# Patient Record
Sex: Male | Born: 1950
Health system: Southern US, Community
[De-identification: ages and names within clinical notes are randomized; demographics above are authoritative.]

## PROBLEM LIST (undated history)

## (undated) DIAGNOSIS — I6529 Occlusion and stenosis of unspecified carotid artery: Secondary | ICD-10-CM

## (undated) DIAGNOSIS — M1990 Unspecified osteoarthritis, unspecified site: Secondary | ICD-10-CM

## (undated) DIAGNOSIS — E785 Hyperlipidemia, unspecified: Secondary | ICD-10-CM

## (undated) DIAGNOSIS — E7800 Pure hypercholesterolemia, unspecified: Secondary | ICD-10-CM

## (undated) DIAGNOSIS — K5090 Crohn's disease, unspecified, without complications: Secondary | ICD-10-CM

## (undated) DIAGNOSIS — I219 Acute myocardial infarction, unspecified: Secondary | ICD-10-CM

## (undated) DIAGNOSIS — C4490 Unspecified malignant neoplasm of skin, unspecified: Secondary | ICD-10-CM

## (undated) DIAGNOSIS — I2510 Atherosclerotic heart disease of native coronary artery without angina pectoris: Secondary | ICD-10-CM

## (undated) HISTORY — DX: Acute myocardial infarction, unspecified: I21.9

## (undated) HISTORY — PX: VASECTOMY: SHX75

## (undated) HISTORY — PX: HERNIA REPAIR: SHX51

## (undated) HISTORY — DX: Occlusion and stenosis of unspecified carotid artery: I65.29

## (undated) HISTORY — DX: Hyperlipidemia, unspecified: E78.5

## (undated) HISTORY — PX: APPENDECTOMY: SHX54

## (undated) HISTORY — PX: KNEE SURGERY: SHX244

## (undated) HISTORY — PX: ELBOW SURGERY: SHX618

## (undated) HISTORY — PX: TONSILLECTOMY: SUR1361

---

## 1998-06-18 ENCOUNTER — Inpatient Hospital Stay (HOSPITAL_COMMUNITY): Admission: EM | Admit: 1998-06-18 | Discharge: 1998-06-20 | Payer: Self-pay | Admitting: Emergency Medicine

## 2004-02-22 DIAGNOSIS — C4490 Unspecified malignant neoplasm of skin, unspecified: Secondary | ICD-10-CM

## 2004-02-22 HISTORY — DX: Unspecified malignant neoplasm of skin, unspecified: C44.90

## 2009-12-22 LAB — HM COLONOSCOPY

## 2010-08-05 ENCOUNTER — Other Ambulatory Visit: Payer: Self-pay | Admitting: Family Medicine

## 2010-08-11 ENCOUNTER — Ambulatory Visit
Admission: RE | Admit: 2010-08-11 | Discharge: 2010-08-11 | Disposition: A | Discharge status: Home / Self Care | Payer: BC Managed Care – PPO | Source: Ambulatory Visit | Attending: Family Medicine | Admitting: Family Medicine

## 2011-05-07 ENCOUNTER — Ambulatory Visit (INDEPENDENT_AMBULATORY_CARE_PROVIDER_SITE_OTHER): Payer: BC Managed Care – PPO | Admitting: Internal Medicine

## 2011-05-07 VITALS — BP 160/79 | HR 60 | Temp 98.0°F | Resp 16 | Ht 67.75 in | Wt 184.4 lb

## 2011-05-07 DIAGNOSIS — E785 Hyperlipidemia, unspecified: Secondary | ICD-10-CM

## 2011-05-07 DIAGNOSIS — J019 Acute sinusitis, unspecified: Secondary | ICD-10-CM

## 2011-05-07 DIAGNOSIS — Z789 Other specified health status: Secondary | ICD-10-CM

## 2011-05-07 DIAGNOSIS — J3089 Other allergic rhinitis: Secondary | ICD-10-CM

## 2011-05-07 DIAGNOSIS — J329 Chronic sinusitis, unspecified: Secondary | ICD-10-CM

## 2011-05-07 MED ORDER — AMOXICILLIN 500 MG PO CAPS: 1000.0000 mg | ORAL_CAPSULE | Freq: Two times a day (BID) | ORAL | Status: AC

## 2011-05-07 MED ORDER — METHYLPREDNISOLONE ACETATE 80 MG/ML IJ SUSP
80.0000 mg | Freq: Once | INTRAMUSCULAR | Status: AC
  Administered 2011-05-07: 80 mg via INTRAMUSCULAR

## 2011-05-07 NOTE — Progress Notes (Signed)
  Subjective:    Patient ID: Frederick Rush, male    DOB: 08/17/1950, 61 y.o.   MRN: [id number]  HPI Has allergys, now co st, sinus pain and yellow discharge. Mild cough,and chest congestion. Quit smoking, 10 yrs.   Review of Systems     Objective:   Physical Exam  Constitutional: He is oriented to person, place, and time. He appears well-developed.  HENT:  Right Ear: Tympanic membrane and ear canal normal.  Left Ear: Tympanic membrane and ear canal normal.  Nose: Mucosal edema, rhinorrhea and sinus tenderness present. Right sinus exhibits frontal sinus tenderness. Left sinus exhibits frontal sinus tenderness.  Mouth/Throat: Posterior oropharyngeal erythema present.  Eyes: EOM are normal.  Neck: Normal range of motion. Neck supple.  Cardiovascular: Normal rate and regular rhythm.   Pulmonary/Chest: Effort normal and breath sounds normal.  Neurological: He is alert and oriented to person, place, and time.          Assessment & Plan:  Sinusitis Allergy  Amoxil 1g BID Depomedrol 26m IM

## 2011-07-15 ENCOUNTER — Emergency Department (HOSPITAL_COMMUNITY): Payer: BC Managed Care – PPO

## 2011-07-15 ENCOUNTER — Ambulatory Visit (INDEPENDENT_AMBULATORY_CARE_PROVIDER_SITE_OTHER): Payer: BC Managed Care – PPO | Admitting: Family Medicine

## 2011-07-15 ENCOUNTER — Inpatient Hospital Stay (HOSPITAL_COMMUNITY)
Admission: EM | Admit: 2011-07-15 | Discharge: 2011-07-19 | DRG: 124 | Disposition: A | Discharge status: Home / Self Care | Payer: BC Managed Care – PPO | Attending: Cardiovascular Disease | Admitting: Cardiovascular Disease

## 2011-07-15 ENCOUNTER — Encounter (HOSPITAL_COMMUNITY): Payer: Self-pay

## 2011-07-15 VITALS — BP 138/94 | HR 57 | Temp 97.5°F | Resp 16 | Ht 67.5 in | Wt 180.0 lb

## 2011-07-15 DIAGNOSIS — I2510 Atherosclerotic heart disease of native coronary artery without angina pectoris: Principal | ICD-10-CM | POA: Diagnosis present

## 2011-07-15 DIAGNOSIS — R079 Chest pain, unspecified: Secondary | ICD-10-CM

## 2011-07-15 DIAGNOSIS — K5090 Crohn's disease, unspecified, without complications: Secondary | ICD-10-CM | POA: Diagnosis present

## 2011-07-15 DIAGNOSIS — Z87891 Personal history of nicotine dependence: Secondary | ICD-10-CM

## 2011-07-15 DIAGNOSIS — R42 Dizziness and giddiness: Secondary | ICD-10-CM

## 2011-07-15 DIAGNOSIS — K279 Peptic ulcer, site unspecified, unspecified as acute or chronic, without hemorrhage or perforation: Secondary | ICD-10-CM | POA: Diagnosis present

## 2011-07-15 DIAGNOSIS — I498 Other specified cardiac arrhythmias: Secondary | ICD-10-CM | POA: Diagnosis present

## 2011-07-15 DIAGNOSIS — I249 Acute ischemic heart disease, unspecified: Secondary | ICD-10-CM

## 2011-07-15 DIAGNOSIS — I200 Unstable angina: Secondary | ICD-10-CM | POA: Diagnosis present

## 2011-07-15 DIAGNOSIS — Z8249 Family history of ischemic heart disease and other diseases of the circulatory system: Secondary | ICD-10-CM

## 2011-07-15 DIAGNOSIS — R61 Generalized hyperhidrosis: Secondary | ICD-10-CM

## 2011-07-15 DIAGNOSIS — E785 Hyperlipidemia, unspecified: Secondary | ICD-10-CM

## 2011-07-15 HISTORY — PX: CARDIAC CATHETERIZATION: SHX172

## 2011-07-15 HISTORY — DX: Unspecified malignant neoplasm of skin, unspecified: C44.90

## 2011-07-15 HISTORY — DX: Crohn's disease, unspecified, without complications: K50.90

## 2011-07-15 HISTORY — DX: Pure hypercholesterolemia, unspecified: E78.00

## 2011-07-15 HISTORY — DX: Atherosclerotic heart disease of native coronary artery without angina pectoris: I25.10

## 2011-07-15 HISTORY — DX: Unspecified osteoarthritis, unspecified site: M19.90

## 2011-07-15 LAB — CBC
HCT: 36.7 % — ABNORMAL LOW (ref 39.0–52.0)
Hemoglobin: 12.8 g/dL — ABNORMAL LOW (ref 13.0–17.0)
MCH: 31 pg (ref 26.0–34.0)
MCHC: 34.9 g/dL (ref 30.0–36.0)
MCV: 88.9 fL (ref 78.0–100.0)
Platelets: 269 10*3/uL (ref 150–400)
RBC: 4.13 MIL/uL — ABNORMAL LOW (ref 4.22–5.81)
RDW: 12.9 % (ref 11.5–15.5)
WBC: 7 10*3/uL (ref 4.0–10.5)

## 2011-07-15 LAB — COMPREHENSIVE METABOLIC PANEL
ALT: 17 U/L (ref 0–53)
AST: 21 U/L (ref 0–37)
Albumin: 3.6 g/dL (ref 3.5–5.2)
Alkaline Phosphatase: 24 U/L — ABNORMAL LOW (ref 39–117)
BUN: 12 mg/dL (ref 6–23)
CO2: 26 mEq/L (ref 19–32)
Calcium: 9.2 mg/dL (ref 8.4–10.5)
Chloride: 100 mEq/L (ref 96–112)
Creatinine, Ser: 0.93 mg/dL (ref 0.50–1.35)
GFR calc Af Amer: >90 mL/min (ref >90)
GFR calc non Af Amer: 88 mL/min — ABNORMAL LOW (ref >90)
Glucose, Bld: 79 mg/dL (ref 70–99)
Potassium: 3.7 mEq/L (ref 3.5–5.1)
Sodium: 136 mEq/L (ref 135–145)
Total Bilirubin: 0.4 mg/dL (ref 0.3–1.2)
Total Protein: 6.1 g/dL (ref 6.0–8.3)

## 2011-07-15 LAB — PROTIME-INR
INR: 0.99 (ref 0.00–1.49)
Prothrombin Time: 13.3 seconds (ref 11.6–15.2)

## 2011-07-15 LAB — CK TOTAL AND CKMB (NOT AT ARMC)
CK, MB: 5.5 ng/mL — ABNORMAL HIGH (ref 0.3–4.0)
Relative Index: 2 (ref 0.0–2.5)
Total CK: 276 U/L — ABNORMAL HIGH (ref 7–232)

## 2011-07-15 LAB — TROPONIN I: Troponin I: <0.3 ng/mL (ref <0.30)

## 2011-07-15 MED ORDER — SODIUM CHLORIDE 0.9 % IV BOLUS (SEPSIS)
1000.0000 mL | Freq: Once | INTRAVENOUS | Status: AC
  Administered 2011-07-15: 1000 mL via INTRAVENOUS

## 2011-07-15 NOTE — ED Provider Notes (Addendum)
History     CSN: [id number]  Arrival date & time 07/15/11  20   First MD Initiated Contact with Patient 07/15/11 1705      Chief Complaint  Patient presents with  . Chest Pain     HPI The patient presents with new concerns of chest pain, diaphoresis.  He notes that over the past 3 days he has began to experience intermittent chest pain and diaphoresis.  Symptoms seem to occur after prolonged exertion, improved with rest.  There also seems to be some increased frequency with upright positioning, though this is inconsistent.  The patient denies any syncope, near syncope, vomiting.  He states that prior to the last few days he is typically unable to perform his activities of daily living, and his job function without significant fatigue, this has also become less possible over this timeframe. On arrival the patient has no complaints.  The patient has no history of cardiac disease, no history of hypertension or diabetes.  He also has no history of provocative cardiac testing.  Past Medical History  Diagnosis Date  . Hyperlipidemia   . Arthritis   . Hypercholesterolemia   . Crohn disease     Past Surgical History  Procedure Date  . Hernia repair   . Knee surgery     arthroscopy    No family history on file.  History  Substance Use Topics  . Smoking status: Former Research scientist (life sciences)  . Smokeless tobacco: Not on file  . Alcohol Use: Yes      Review of Systems  Constitutional:       Per HPI, otherwise negative  HENT:       Per HPI, otherwise negative  Eyes: Negative.   Respiratory:       Per HPI, otherwise negative  Cardiovascular:       Per HPI, otherwise negative  Gastrointestinal: Negative for vomiting.  Genitourinary: Negative.   Musculoskeletal:       Per HPI, otherwise negative  Skin: Negative.   Neurological: Negative for syncope.    Allergies  Review of patient's allergies indicates no known allergies.  Home Medications   Current Outpatient Rx  Name Route  Sig Dispense Refill  . MELOXICAM 15 MG PO TABS Oral Take 15 mg by mouth daily.    Marland Kitchen MESALAMINE ER 0.375 G PO CP24 Oral Take 1,500 mg by mouth every morning. 1530m=4 capsules    . ROSUVASTATIN CALCIUM 20 MG PO TABS Oral Take 20 mg by mouth every evening.       BP 146/82  Pulse 57  Temp(Src) 97.5 F (36.4 C) (Oral)  Resp 21  Ht 5' 8"  (1.727 m)  Wt 181 lb (82.101 kg)  BMI 27.52 kg/m2  SpO2 100%  Physical Exam  Nursing note and vitals reviewed. Constitutional: He is oriented to person, place, and time. He appears well-developed. No distress.  HENT:  Head: Normocephalic and atraumatic.  Eyes: Conjunctivae and EOM are normal.  Cardiovascular: Normal rate and regular rhythm.   Pulmonary/Chest: Effort normal. No stridor. No respiratory distress.  Abdominal: He exhibits no distension.  Musculoskeletal: He exhibits no edema.  Neurological: He is alert and oriented to person, place, and time.  Skin: Skin is warm and dry.  Psychiatric: He has a normal mood and affect.    ED Course  Procedures (including critical care time)  Labs Reviewed  COMPREHENSIVE METABOLIC PANEL - Abnormal; Notable for the following:    Alkaline Phosphatase 24 (*)    GFR calc non Af  Amer 88 (*)    All other components within normal limits  CBC - Abnormal; Notable for the following:    RBC 4.13 (*)    Hemoglobin 12.8 (*)    HCT 36.7 (*)    All other components within normal limits  CK TOTAL AND CKMB - Abnormal; Notable for the following:    Total CK 276 (*)    CK, MB 5.5 (*)    All other components within normal limits  TROPONIN I  PROTIME-INR   Dg Chest 2 View  07/15/2011  *RADIOLOGY REPORT*  Clinical Data: Left upper chest pain.  CHEST - 2 VIEW  Comparison: None.  Findings: Cardiac and mediastinal contours appear normal.  The lungs appear clear.  No pleural effusion is identified.  IMPRESSION:  No significant abnormality identified.  Original Report Authenticated By: Carron Curie, M.D.      No diagnosis found.   Cardiac 61 sinus rhythm normal Pulse oximetry 100% room air normal    Date: 07/15/2011  Rate: 60  Rhythm: normal sinus rhythm  QRS Axis: normal  Intervals: normal  ST/T Wave abnormalities: normal  Conduction Disutrbances: none  Narrative Interpretation: unremarkable     MDM  This generally well-appearing 61 year old male with no history of prior cardiac evaluation now presents with new diaphoresis, easy fatigability with exertion, chest pain.  On exam, and throughout the patient's ED evaluation he was asymptomatic.  The patient's labs are reassuring.  Although his CK-MB was mildly positive, the cardiac index was negative, which coupled with a negative troponin his reassuring for the absence of ongoing cardiac ischemia.  Given the patient's lack of prior imaging, he will be admitted to the CDU under the chest pain protocol for anticipated a.m. CT angiography     Carmin Muskrat, MD 07/15/11 1940  12:05 AM Patient remains asymptomatic in CDU.  His care was endorsed to Dr. Lita Mains.  Carmin Muskrat, MD 07/16/11 [phone]

## 2011-07-15 NOTE — ED Notes (Signed)
Pt stated that he was mowing the lawn on Wednesday and begin having left sided Chest tightness. He also became extremely weak. He stated that the pain went away in about 30 minutes- 1hour. He stated that he went to work the next day and begin having the exact same chest tightness with extreme sweating. Pain went away in about 30 minutes-1 hour.  He stated that today he begin have some more chest tightness and that radiated to his left arm. He went to the Urgent Care and they sent him to the ED. He stated that he has not had any SOB or n/v.  Currently, no CP or SOB. Will continue to monitor.

## 2011-07-15 NOTE — ED Notes (Signed)
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 Pt stated that Wednesday, after he mowed the grass, he was unusually tired. Pt was given 4 Baby ASA and 1 NTG SL.

## 2011-07-15 NOTE — ED Notes (Signed)
Seen and examined by Dr. Vanita Panda

## 2011-07-15 NOTE — Progress Notes (Signed)
Subjective: 61 year old man who mowed his lawn in the heat on Wednesday. He went indoors and developed a slight episode of severe diaphoresis. He has pain in his left upper chest and a little dizziness. He just sat there for a while. It finally subsided. 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 On his last he developed severe diaphoresis, dizziness, and some left chest and neck pain. He went inside a cooler and sat for about 20 minutes to wait until the sweating stopped. Today he had a similar episode. He drove himself here to the office. He was not any pain when he got here. He does now say that he has a little bit of pain in his left axillary wing.  He does have a history of hyperlipidemia. No other major heart disease.  Objective: Bilateral white white male acute distress, little bit anxious appearing. His throat clear. Neck supple. Chest clear to auscultation. Heart regular without murmurs. Soft without masses tenderness. EKG appears normal. Periodically he was noted rubbing his chest, and he did admit to the pain.  Patient was given 7.5 mg of aspirin and one sublingual much glycerin. IV was begun. He was given O2. EMS was called and he was transported to [facility].  Assessment: Acute coronary syndrome Dizziness Chest pain Diaphoresis  Plan: He was sent to the ER. He will need cardiac evaluation.

## 2011-07-15 NOTE — ED Notes (Signed)
Report given pt. Transferred to CDU, NAD noted

## 2011-07-15 NOTE — Patient Instructions (Signed)
To ER by EMS

## 2011-07-15 NOTE — ED Notes (Signed)
Report given to Wendy RN

## 2011-07-16 ENCOUNTER — Encounter (HOSPITAL_COMMUNITY): Payer: Self-pay | Admitting: Cardiology

## 2011-07-16 ENCOUNTER — Observation Stay (HOSPITAL_COMMUNITY): Payer: BC Managed Care – PPO

## 2011-07-16 DIAGNOSIS — Z8249 Family history of ischemic heart disease and other diseases of the circulatory system: Secondary | ICD-10-CM

## 2011-07-16 DIAGNOSIS — E785 Hyperlipidemia, unspecified: Secondary | ICD-10-CM | POA: Diagnosis present

## 2011-07-16 DIAGNOSIS — K5090 Crohn's disease, unspecified, without complications: Secondary | ICD-10-CM | POA: Diagnosis present

## 2011-07-16 DIAGNOSIS — I200 Unstable angina: Secondary | ICD-10-CM | POA: Diagnosis present

## 2011-07-16 DIAGNOSIS — K279 Peptic ulcer, site unspecified, unspecified as acute or chronic, without hemorrhage or perforation: Secondary | ICD-10-CM | POA: Diagnosis present

## 2011-07-16 DIAGNOSIS — Z87891 Personal history of nicotine dependence: Secondary | ICD-10-CM

## 2011-07-16 LAB — CARDIAC PANEL(CRET KIN+CKTOT+MB+TROPI)
CK, MB: 3.1 ng/mL (ref 0.3–4.0)
CK, MB: 3 ng/mL (ref 0.3–4.0)
Relative Index: 1.8 (ref 0.0–2.5)
Relative Index: 1.8 (ref 0.0–2.5)
Total CK: 171 U/L (ref 7–232)
Total CK: 169 U/L (ref 7–232)
Troponin I: <0.3 ng/mL (ref <0.30)
Troponin I: <0.3 ng/mL (ref <0.30)

## 2011-07-16 LAB — COMPREHENSIVE METABOLIC PANEL
ALT: 16 U/L (ref 0–53)
AST: 19 U/L (ref 0–37)
Albumin: 3.8 g/dL (ref 3.5–5.2)
Alkaline Phosphatase: 28 U/L — ABNORMAL LOW (ref 39–117)
BUN: 16 mg/dL (ref 6–23)
CO2: 24 mEq/L (ref 19–32)
Calcium: 9.5 mg/dL (ref 8.4–10.5)
Chloride: 102 mEq/L (ref 96–112)
Creatinine, Ser: 1.28 mg/dL (ref 0.50–1.35)
GFR calc Af Amer: 68 mL/min — ABNORMAL LOW (ref >90)
GFR calc non Af Amer: 58 mL/min — ABNORMAL LOW (ref >90)
Glucose, Bld: 96 mg/dL (ref 70–99)
Potassium: 4.1 mEq/L (ref 3.5–5.1)
Sodium: 136 mEq/L (ref 135–145)
Total Bilirubin: 0.6 mg/dL (ref 0.3–1.2)
Total Protein: 6.5 g/dL (ref 6.0–8.3)

## 2011-07-16 LAB — DIFFERENTIAL
Basophils Absolute: 0.1 10*3/uL (ref 0.0–0.1)
Basophils Relative: 1 % (ref 0–1)
Eosinophils Absolute: 0.2 10*3/uL (ref 0.0–0.7)
Eosinophils Relative: 2 % (ref 0–5)
Lymphocytes Relative: 18 % (ref 12–46)
Lymphs Abs: 1.8 10*3/uL (ref 0.7–4.0)
Monocytes Absolute: 1.1 10*3/uL — ABNORMAL HIGH (ref 0.1–1.0)
Monocytes Relative: 11 % (ref 3–12)
Neutro Abs: 6.8 10*3/uL (ref 1.7–7.7)
Neutrophils Relative %: 69 % (ref 43–77)

## 2011-07-16 LAB — CBC
HCT: 39.4 % (ref 39.0–52.0)
Hemoglobin: 13.6 g/dL (ref 13.0–17.0)
MCH: 31.1 pg (ref 26.0–34.0)
MCHC: 34.5 g/dL (ref 30.0–36.0)
MCV: 90 fL (ref 78.0–100.0)
Platelets: 261 10*3/uL (ref 150–400)
RBC: 4.38 MIL/uL (ref 4.22–5.81)
RDW: 12.9 % (ref 11.5–15.5)
WBC: 10 10*3/uL (ref 4.0–10.5)

## 2011-07-16 LAB — MAGNESIUM: Magnesium: 2.1 mg/dL (ref 1.5–2.5)

## 2011-07-16 LAB — POCT I-STAT TROPONIN I
Troponin i, poc: 0.01 ng/mL (ref 0.00–0.08)
Troponin i, poc: 0 ng/mL (ref 0.00–0.08)

## 2011-07-16 LAB — APTT: aPTT: 25 seconds (ref 24–37)

## 2011-07-16 LAB — TSH: TSH: 1.212 u[IU]/mL (ref 0.350–4.500)

## 2011-07-16 LAB — HEPARIN LEVEL (UNFRACTIONATED): Heparin Unfractionated: 0.46 IU/mL (ref 0.30–0.70)

## 2011-07-16 LAB — PROTIME-INR
INR: 0.96 (ref 0.00–1.49)
Prothrombin Time: 13 seconds (ref 11.6–15.2)

## 2011-07-16 MED ORDER — MESALAMINE ER 0.375 G PO CP24: 1.5000 g | ORAL_CAPSULE | Freq: Every morning | ORAL | Status: DC

## 2011-07-16 MED ORDER — ASPIRIN 81 MG PO CHEW
324.0000 mg | CHEWABLE_TABLET | ORAL | Status: AC
  Administered 2011-07-16: 324 mg via ORAL
  Filled 2011-07-16: qty 3

## 2011-07-16 MED ORDER — ACETAMINOPHEN 325 MG PO TABS
650.0000 mg | ORAL_TABLET | ORAL | Status: DC | PRN
  Administered 2011-07-17 – 2011-07-18 (×2): 650 mg via ORAL
  Filled 2011-07-16 (×2): qty 2

## 2011-07-16 MED ORDER — ALPRAZOLAM 0.25 MG PO TABS: 0.2500 mg | ORAL_TABLET | Freq: Three times a day (TID) | ORAL | Status: DC | PRN

## 2011-07-16 MED ORDER — METOPROLOL TARTRATE 25 MG PO TABS
ORAL_TABLET | ORAL | Status: AC
  Filled 2011-07-16: qty 2

## 2011-07-16 MED ORDER — METOPROLOL TARTRATE 25 MG PO TABS
50.0000 mg | ORAL_TABLET | Freq: Once | ORAL | Status: AC
  Administered 2011-07-16: 50 mg via ORAL

## 2011-07-16 MED ORDER — SODIUM CHLORIDE 0.9 % IV SOLN: 250.0000 mL | INTRAVENOUS | Status: DC | PRN

## 2011-07-16 MED ORDER — NITROGLYCERIN 0.4 MG SL SUBL
SUBLINGUAL_TABLET | SUBLINGUAL | Status: AC
  Filled 2011-07-16: qty 25

## 2011-07-16 MED ORDER — NITROGLYCERIN 0.4 MG SL SUBL: 0.4000 mg | SUBLINGUAL_TABLET | SUBLINGUAL | Status: DC | PRN

## 2011-07-16 MED ORDER — TRAMADOL HCL 50 MG PO TABS
50.0000 mg | ORAL_TABLET | Freq: Four times a day (QID) | ORAL | Status: DC | PRN
  Filled 2011-07-16: qty 1

## 2011-07-16 MED ORDER — ASPIRIN 300 MG RE SUPP
300.0000 mg | RECTAL | Status: AC
  Filled 2011-07-16: qty 1

## 2011-07-16 MED ORDER — METOPROLOL TARTRATE 1 MG/ML IV SOLN
INTRAVENOUS | Status: AC
  Filled 2011-07-16: qty 5

## 2011-07-16 MED ORDER — PANTOPRAZOLE SODIUM 40 MG PO TBEC
40.0000 mg | DELAYED_RELEASE_TABLET | Freq: Every day | ORAL | Status: DC
  Administered 2011-07-16 – 2011-07-19 (×4): 40 mg via ORAL
  Filled 2011-07-16 (×4): qty 1

## 2011-07-16 MED ORDER — HEPARIN BOLUS VIA INFUSION
4000.0000 [IU] | Freq: Once | INTRAVENOUS | Status: AC
  Administered 2011-07-16: 4000 [IU] via INTRAVENOUS
  Filled 2011-07-16: qty 4000

## 2011-07-16 MED ORDER — MELOXICAM 15 MG PO TABS
15.0000 mg | ORAL_TABLET | Freq: Every day | ORAL | Status: DC
  Administered 2011-07-16 – 2011-07-18 (×3): 15 mg via ORAL
  Filled 2011-07-16 (×4): qty 1

## 2011-07-16 MED ORDER — SODIUM CHLORIDE 0.9 % IJ SOLN: 3.0000 mL | INTRAMUSCULAR | Status: DC | PRN

## 2011-07-16 MED ORDER — HEPARIN (PORCINE) IN NACL 100-0.45 UNIT/ML-% IJ SOLN
1200.0000 [IU]/h | INTRAMUSCULAR | Status: DC
  Administered 2011-07-16 – 2011-07-18 (×3): 1200 [IU]/h via INTRAVENOUS
  Filled 2011-07-16 (×5): qty 250

## 2011-07-16 MED ORDER — ASPIRIN EC 81 MG PO TBEC
81.0000 mg | DELAYED_RELEASE_TABLET | Freq: Every day | ORAL | Status: DC
  Administered 2011-07-17 – 2011-07-18 (×2): 81 mg via ORAL
  Filled 2011-07-16 (×3): qty 1

## 2011-07-16 MED ORDER — ATORVASTATIN CALCIUM 10 MG PO TABS
10.0000 mg | ORAL_TABLET | Freq: Every day | ORAL | Status: DC
  Administered 2011-07-16 – 2011-07-18 (×3): 10 mg via ORAL
  Filled 2011-07-16 (×4): qty 1

## 2011-07-16 MED ORDER — ZOLPIDEM TARTRATE 5 MG PO TABS: 10.0000 mg | ORAL_TABLET | Freq: Every evening | ORAL | Status: DC | PRN

## 2011-07-16 MED ORDER — ONDANSETRON HCL 4 MG/2ML IJ SOLN: 4.0000 mg | Freq: Four times a day (QID) | INTRAMUSCULAR | Status: DC | PRN

## 2011-07-16 NOTE — Progress Notes (Signed)
ANTICOAGULATION CONSULT NOTE - Follow Up Consult  Pharmacy Consult for Heparin Indication: chest pain/ACS  No Known Allergies  Patient Measurements: Height: 5' 8"  (172.7 cm) Weight: 181 lb (82.101 kg) IBW/kg (Calculated) : 68.4  Heparin Dosing Weight: 82 kg  Vital Signs: Temp: 98.5 F (36.9 C) (05/25 2100) Temp src: Oral (05/25 2100) BP: 145/74 mmHg (05/25 2100) Pulse Rate: 52  (05/25 2100)  Labs:  Basename 07/16/11 2108 07/16/11 1905 07/16/11 1515 07/16/11 1307 07/15/11 1712 07/15/11 1710  HGB -- -- -- 13.6 -- 12.8*  HCT -- -- -- 39.4 -- 36.7*  PLT -- -- -- 261 -- 269  APTT -- -- -- 25 -- --  LABPROT -- -- -- 13.0 -- 13.3  INR -- -- -- 0.96 -- 0.99  HEPARINUNFRC 0.46 -- -- -- -- --  CREATININE -- -- -- 1.28 -- 0.93  CKTOTAL -- 169 171 -- 276* --  CKMB -- 3.0 3.1 -- 5.5* --  TROPONINI -- <0.30 <0.30 -- <0.30 --    Estimated Creatinine Clearance: 62.5 ml/min (by C-G formula based on Cr of 1.28).   Assessment: 61 y.o. M on heparin for ACS sx while awaiting cardiac cath on Tuesday, 5/28. Heparin level this evening is therapeutic (HL 0.46, goal of 0.3-0.7). No s/sx of bleeding noted per nurse report.   Goal of Therapy:  Heparin level 0.3-0.7 units/ml Monitor platelets by anticoagulation protocol: Yes   Plan:  1. Continue heparin at 1200 units/hr (12 ml/hr) 2. Will continue to monitor for any signs/symptoms of bleeding and will follow up with heparin level in the a.m to confirm therapeutic.   Alycia Rossetti, PharmD, BCPS Clinical Pharmacist Pager: [phone] 07/16/2011 10:13 PM

## 2011-07-16 NOTE — ED Notes (Signed)
Left AC NSL intact and site u

## 2011-07-16 NOTE — ED Notes (Signed)
Pt NPO and advised he cannot have anything else to eat or drink due to scheduled test for 07/16/11. Verbalized understanding.

## 2011-07-16 NOTE — Progress Notes (Signed)
ANTICOAGULATION CONSULT NOTE - Initial Consult  Pharmacy Consult for heparin Indication: chest pain/ACS  No Known Allergies  Patient Measurements: Height: 5' 8"  (172.7 cm) Weight: 181 lb (82.101 kg) IBW/kg (Calculated) : 68.4  Heparin Dosing Weight: 75 kg  Vital Signs: BP: 139/100 mmHg (05/25 1059) Pulse Rate: 61  (05/25 1059)  Labs:  Basename 07/15/11 1712 07/15/11 1710  HGB -- 12.8*  HCT -- 36.7*  PLT -- 269  APTT -- --  LABPROT -- 13.3  INR -- 0.99  HEPARINUNFRC -- --  CREATININE -- 0.93  CKTOTAL 276* --  CKMB 5.5* --  TROPONINI <0.30 --    Estimated Creatinine Clearance: 86.1 ml/min (by C-G formula based on Cr of 0.93).   Medical History: Past Medical History  Diagnosis Date  . Hyperlipidemia   . Arthritis   . Hypercholesterolemia   . Crohn disease     Medications:  Prescriptions prior to admission  Medication Sig Dispense Refill  . meloxicam (MOBIC) 15 MG tablet Take 15 mg by mouth daily.      . mesalamine (APRISO) 0.375 G 24 hr capsule Take 1,500 mg by mouth every morning. 1541m=4 capsules      . rosuvastatin (CRESTOR) 20 MG tablet Take 20 mg by mouth every evening.         Assessment: 61yo man to start heparin for CP. Goal of Therapy:  Heparin level 0.3-0.7 units/ml Monitor platelets by anticoagulation protocol: Yes   Plan:  Heparin 4000 unit bolus and drip at 1200 units/hr Check heparin level and CBC 8 hours after start and then daily while on heparin.  Muzamil Harker P[place]et 07/16/2011,1:20 PM

## 2011-07-16 NOTE — ED Provider Notes (Signed)
Medical screening examination/treatment/procedure(s) were conducted as a shared visit with non-physician practitioner(s) and myself.  I personally evaluated the patient during the encounter Pt is a 61 year old man with a 3 day history of exertional chest pain and sweating, who is currently asymptomatic.  He had cardiac CT with calcium score over 1000, indicating significant CAD.  10:58 AM  Case discussed with Kerin Ransom, P.A.-C. For [facility]. They will see pt.    Mylinda Latina III, MD 07/16/11 2035

## 2011-07-16 NOTE — H&P (Signed)
Patient ID: Frederick Rush MRN: [id number], DOB/AGE: [birth year]   Admit date: 07/15/2011   Primary Physician: William Hamburger, MD, MD Primary Cardiologist: Dr Gwenlyn Found (new)  HPI: 61 y/o male with no prior history of CAD, admitted yesterday to the ER from his primary care MD with chest pain and a history of diaphoresis. The pt says Thursday prior to admission he had an episode of spontaneous diaphoresis at work. He had no chest pain. Friday he had another episode of diaphoresis followed by some back pain that migrated to his Lt chest. He was sent to the ER for further evaluation. Coronary CT is abnormal and the patient is admitted now for for further evaluation and treatment.   Problem List: Past Medical History  Diagnosis Date  . Hyperlipidemia   . Arthritis   . Hypercholesterolemia   . Crohn disease     Past Surgical History  Procedure Date  . Hernia repair   . Knee surgery     arthroscopy     Allergies: No Known Allergies   Home Medications  (Not in a hospital admission)   Family History  Problem Relation Age of Onset  . Coronary artery disease Mother 86    CABG     History   Social History  . Marital Status: Married    Spouse Name: N/A    Number of Children: N/A  . Years of Education: N/A   Occupational History  . Not on file.   Social History Main Topics  . Smoking status: Former Smoker    Quit date: 07/15/2001  . Smokeless tobacco: Not on file  . Alcohol Use: Yes  . Drug Use: No  . Sexually Active: Not on file   Other Topics Concern  . Not on file   Social History Narrative   Drive a truck for [employer]     Review of Systems: General: negative for chills, fever, night sweats or weight changes.  Cardiovascular: negative for chest pain, dyspnea on exertion, edema, orthopnea, palpitations, paroxysmal nocturnal dyspnea or shortness of breath Dermatological: negative for rash Respiratory: negative for cough or wheezing Urologic: negative for  hematuria Abdominal: negative for nausea, vomiting, diarrhea, bright red blood per rectum, melena, or hematemesis Neurologic: negative for visual changes, syncope, or dizziness All other systems reviewed and are otherwise negative except as noted above.  Physical Exam: Blood pressure 139/100, pulse 61, temperature 97.5 F (36.4 C), temperature source Oral, resp. rate 20, height 5' 8"  (1.727 m), weight 82.101 kg (181 lb), SpO2 100.00%.  General appearance: alert, cooperative and no distress Neck: no carotid bruit, no JVD, supple, symmetrical, trachea midline and thyroid not enlarged, symmetric, no tenderness/mass/nodules Lungs: clear to auscultation bilaterally Heart: regular rate and rhythm, S1, S2 normal, no murmur, click, rub or gallop Abdomen: soft, non-tender; bowel sounds normal; no masses,  no organomegaly Extremities: extremities normal, atraumatic, no cyanosis or edema Pulses: 2+ and symmetric Skin: Skin color, texture, turgor normal. No rashes or lesions Neurologic: Grossly normal    Labs:   Results for orders placed during the hospital encounter of 07/15/11 (from the past 24 hour(s))  COMPREHENSIVE METABOLIC PANEL     Status: Abnormal   Collection Time   07/15/11  5:10 PM      Component Value Range   Sodium 136  135 - 145 (mEq/L)   Potassium 3.7  3.5 - 5.1 (mEq/L)   Chloride 100  96 - 112 (mEq/L)   CO2 26  19 - 32 (mEq/L)   Glucose, Bld  79  70 - 99 (mg/dL)   BUN 12  6 - 23 (mg/dL)   Creatinine, Ser 0.93  0.50 - 1.35 (mg/dL)   Calcium 9.2  8.4 - 10.5 (mg/dL)   Total Protein 6.1  6.0 - 8.3 (g/dL)   Albumin 3.6  3.5 - 5.2 (g/dL)   AST 21  0 - 37 (U/L)   ALT 17  0 - 53 (U/L)   Alkaline Phosphatase 24 (*) 39 - 117 (U/L)   Total Bilirubin 0.4  0.3 - 1.2 (mg/dL)   GFR calc non Af Amer 88 (*) >90 (mL/min)   GFR calc Af Amer >90  >90 (mL/min)  CBC     Status: Abnormal   Collection Time   07/15/11  5:10 PM      Component Value Range   WBC 7.0  4.0 - 10.5 (K/uL)   RBC  4.13 (*) 4.22 - 5.81 (MIL/uL)   Hemoglobin 12.8 (*) 13.0 - 17.0 (g/dL)   HCT 36.7 (*) 39.0 - 52.0 (%)   MCV 88.9  78.0 - 100.0 (fL)   MCH 31.0  26.0 - 34.0 (pg)   MCHC 34.9  30.0 - 36.0 (g/dL)   RDW 12.9  11.5 - 15.5 (%)   Platelets 269  150 - 400 (K/uL)  PROTIME-INR     Status: Normal   Collection Time   07/15/11  5:10 PM      Component Value Range   Prothrombin Time 13.3  11.6 - 15.2 (seconds)   INR 0.99  0.00 - 1.49   TROPONIN I     Status: Normal   Collection Time   07/15/11  5:12 PM      Component Value Range   Troponin I <0.30  <0.30 (ng/mL)  CK TOTAL AND CKMB     Status: Abnormal   Collection Time   07/15/11  5:12 PM      Component Value Range   Total CK 276 (*) 7 - 232 (U/L)   CK, MB 5.5 (*) 0.3 - 4.0 (ng/mL)   Relative Index 2.0  0.0 - 2.5   POCT I-STAT TROPONIN I     Status: Normal   Collection Time   07/16/11  3:30 AM      Component Value Range   Troponin i, poc 0.01  0.00 - 0.08 (ng/mL)   Comment 3           POCT I-STAT TROPONIN I     Status: Normal   Collection Time   07/16/11  9:33 AM      Component Value Range   Troponin i, poc 0.00  0.00 - 0.08 (ng/mL)   Comment 3              Radiology/Studies: Dg Chest 2 View  07/15/2011  *RADIOLOGY REPORT*  Clinical Data: Left upper chest pain.  CHEST - 2 VIEW  Comparison: None.  Findings: Cardiac and mediastinal contours appear normal.  The lungs appear clear.  No pleural effusion is identified.  IMPRESSION:  No significant abnormality identified.  Original Report Authenticated By: Carron Curie, M.D.    EKG:NSR without acute changes  ASSESSMENT AND PLAN:  Active Problems:  Unstable angina  Dyslipidemia  History of smoking  Family history of coronary artery bypass surgery  Crohn disease  Peptic ulcer disease, endo 2011  Plan-Admit cath Tuesday, add Heparin, nitrates, ASA, PPI. Hold off on beta blocker as he is a little bradycardic now.  Henri Medal, PA-C 07/16/2011, 11:23 AM  Agree  with note  written by Kerin Ransom PAC  + CRF, Sx C/W [place]. Exam benign. EKG w/o acute changes. ENZ neg. CT showed CCS of 1100 with calcium in LAD and RCA. Plan admit, ROMI. IV hep. Cath Tuesday.  Lorretta Harp 07/16/2011 3:48 PM

## 2011-07-16 NOTE — ED Notes (Signed)
Returned from ct scan, back on monitor, no distress noted

## 2011-07-16 NOTE — ED Notes (Signed)
Dr. Lita Mains updated re: one troponin ordered. Rec'd verbal order to order 2 more sets.

## 2011-07-16 NOTE — ED Notes (Signed)
Heart healthy diet tray ordered

## 2011-07-16 NOTE — ED Provider Notes (Signed)
Medical screening examination/treatment/procedure(s) were performed by non-physician practitioner and as supervising physician I was immediately available for consultation/collaboration.   Mylinda Latina III, MD 07/16/11 2033

## 2011-07-16 NOTE — Progress Notes (Signed)
10:58 AM Case discussed with Kerin Ransom, P.A.-C. For [facility] and V[facility].  They will see pt.

## 2011-07-16 NOTE — ED Notes (Signed)
Cardio at bedside to eval pt

## 2011-07-16 NOTE — ED Provider Notes (Signed)
Pt denies any CP, SOB, or palpitations.  Cardiac exam: RRR, no murmurs.  Lungs: CTA-B.  Pt is currently comfortable w/ no complaints.  Awaiting the CT cardiac angio.  [place], [place] 07/16/11 [phone]

## 2011-07-16 NOTE — ED Notes (Addendum)
Taken to CT scan on monitor with RN present

## 2011-07-16 NOTE — ED Provider Notes (Signed)
CT angio was held due to elevated calcium levels.  I've discussed the case with radiologist, Dr. Markus Daft.  Elevated calcium levels indicate higher risk of cardiac event.  Discussed pt's case with Dr. Monia Pouch.  He plans to consult cardiology for further evaluation.  I've notified pt and he is in understanding.  [place], [place] 07/16/11 1031

## 2011-07-17 LAB — CBC
HCT: 36.8 % — ABNORMAL LOW (ref 39.0–52.0)
Hemoglobin: 12.6 g/dL — ABNORMAL LOW (ref 13.0–17.0)
MCH: 30.7 pg (ref 26.0–34.0)
MCHC: 34.2 g/dL (ref 30.0–36.0)
MCV: 89.5 fL (ref 78.0–100.0)
Platelets: 222 10*3/uL (ref 150–400)
RBC: 4.11 MIL/uL — ABNORMAL LOW (ref 4.22–5.81)
RDW: 12.8 % (ref 11.5–15.5)
WBC: 8.9 10*3/uL (ref 4.0–10.5)

## 2011-07-17 LAB — CARDIAC PANEL(CRET KIN+CKTOT+MB+TROPI)
CK, MB: 2.7 ng/mL (ref 0.3–4.0)
Relative Index: 1.9 (ref 0.0–2.5)
Total CK: 145 U/L (ref 7–232)
Troponin I: <0.3 ng/mL (ref <0.30)

## 2011-07-17 LAB — HEPARIN LEVEL (UNFRACTIONATED)
Heparin Unfractionated: 0.57 IU/mL (ref 0.30–0.70)
Heparin Unfractionated: 0.51 IU/mL (ref 0.30–0.70)

## 2011-07-17 MED ORDER — NITROGLYCERIN IN D5W 200-5 MCG/ML-% IV SOLN
10.0000 ug/min | INTRAVENOUS | Status: DC
  Administered 2011-07-17: 10 ug/min via INTRAVENOUS

## 2011-07-17 MED ORDER — NITROGLYCERIN IN D5W 200-5 MCG/ML-% IV SOLN
INTRAVENOUS | Status: AC
  Administered 2011-07-17: 10 ug/min via INTRAVENOUS
  Filled 2011-07-17: qty 250

## 2011-07-17 NOTE — Progress Notes (Signed)
ANTICOAGULATION CONSULT NOTE - Follow Up Consult  Pharmacy Consult for Heparin Indication: chest pain/ACS  No Known Allergies  Patient Measurements: Height: 5' 8"  (172.7 cm) Weight: 181 lb (82.101 kg) IBW/kg (Calculated) : 68.4  Heparin Dosing Weight: 82 kg  Vital Signs: Temp: 97.6 F (36.4 C) (05/26 0627) Temp src: Oral (05/26 0627) BP: 112/66 mmHg (05/26 0627) Pulse Rate: 60  (05/26 0627)  Labs:  Flo Shanks 07/17/11 0540 07/17/11 0036 07/17/11 0033 07/16/11 2108 07/16/11 1905 07/16/11 1515 07/16/11 1307 07/15/11 1710  HGB -- 12.6* -- -- -- -- 13.6 --  HCT -- 36.8* -- -- -- -- 39.4 36.7*  PLT -- 222 -- -- -- -- 261 269  APTT -- -- -- -- -- -- 25 --  LABPROT -- -- -- -- -- -- 13.0 13.3  INR -- -- -- -- -- -- 0.96 0.99  HEPARINUNFRC 0.51 0.57 -- 0.46 -- -- -- --  CREATININE -- -- -- -- -- -- 1.28 0.93  CKTOTAL -- -- 145 -- 169 171 -- --  CKMB -- -- 2.7 -- 3.0 3.1 -- --  TROPONINI -- -- <0.30 -- <0.30 <0.30 -- --    Estimated Creatinine Clearance: 62.5 ml/min (by C-G formula based on Cr of 1.28).   Assessment: 61 y.o. M on heparin for ACS sx while awaiting cardiac cath on Tuesday, 5/28. Heparin level  is therapeutic (HL 0.51, goal of 0.3-0.7). No s/sx of bleeding noted.  Goal of Therapy:  Heparin level 0.3-0.7 units/ml Monitor platelets by anticoagulation protocol: Yes   Plan:  1. Continue heparin at 1200 units/hr (12 ml/hr) 2. Will continue to monitor for any signs/symptoms of bleeding and will follow up with daily heparin levels.   Excell Seltzer, PharmD Clinical Pharmacist Pager: [phone] 07/17/2011 1:33 PM

## 2011-07-17 NOTE — Progress Notes (Signed)
Subjective:  C/O mild left CP last PM. None currently  Objective:  Temp:  [97.6 F (36.4 C)-98.5 F (36.9 C)] 97.6 F (36.4 C) (05/26 0627) Pulse Rate:  [51-61] 60  (05/26 0627) Resp:  [18-20] 18  (05/26 0627) BP: (112-145)/(66-100) 112/66 mmHg (05/26 0627) SpO2:  [96 %-100 %] 98 % (05/26 0627) Weight change:   Intake/Output from previous day: 05/25 0701 - 05/26 0700 In: 480 [P.O.:480] Out: 300 [Urine:300]  Intake/Output from this shift: Total I/O In: 240 [P.O.:240] Out: -   Physical Exam: General appearance: alert and cooperative Neck: no adenopathy, no carotid bruit, no JVD, supple, symmetrical, trachea midline and thyroid not enlarged, symmetric, no tenderness/mass/nodules Lungs: clear to auscultation bilaterally Heart: regular rate and rhythm, S1, S2 normal, no murmur, click, rub or gallop Extremities: extremities normal, atraumatic, no cyanosis or edema  Lab Results: Results for orders placed during the hospital encounter of 07/15/11 (from the past 48 hour(s))  COMPREHENSIVE METABOLIC PANEL     Status: Abnormal   Collection Time   07/15/11  5:10 PM      Component Value Range Comment   Sodium 136  135 - 145 (mEq/L)    Potassium 3.7  3.5 - 5.1 (mEq/L)    Chloride 100  96 - 112 (mEq/L)    CO2 26  19 - 32 (mEq/L)    Glucose, Bld 79  70 - 99 (mg/dL)    BUN 12  6 - 23 (mg/dL)    Creatinine, Ser 0.93  0.50 - 1.35 (mg/dL)    Calcium 9.2  8.4 - 10.5 (mg/dL)    Total Protein 6.1  6.0 - 8.3 (g/dL)    Albumin 3.6  3.5 - 5.2 (g/dL)    AST 21  0 - 37 (U/L)    ALT 17  0 - 53 (U/L)    Alkaline Phosphatase 24 (*) 39 - 117 (U/L)    Total Bilirubin 0.4  0.3 - 1.2 (mg/dL)    GFR calc non Af Amer 88 (*) >90 (mL/min)    GFR calc Af Amer >90  >90 (mL/min)   CBC     Status: Abnormal   Collection Time   07/15/11  5:10 PM      Component Value Range Comment   WBC 7.0  4.0 - 10.5 (K/uL)    RBC 4.13 (*) 4.22 - 5.81 (MIL/uL)    Hemoglobin 12.8 (*) 13.0 - 17.0 (g/dL)    HCT 36.7 (*)  39.0 - 52.0 (%)    MCV 88.9  78.0 - 100.0 (fL)    MCH 31.0  26.0 - 34.0 (pg)    MCHC 34.9  30.0 - 36.0 (g/dL)    RDW 12.9  11.5 - 15.5 (%)    Platelets 269  150 - 400 (K/uL)   PROTIME-INR     Status: Normal   Collection Time   07/15/11  5:10 PM      Component Value Range Comment   Prothrombin Time 13.3  11.6 - 15.2 (seconds)    INR 0.99  0.00 - 1.49    TROPONIN I     Status: Normal   Collection Time   07/15/11  5:12 PM      Component Value Range Comment   Troponin I <0.30  <0.30 (ng/mL)   CK TOTAL AND CKMB     Status: Abnormal   Collection Time   07/15/11  5:12 PM      Component Value Range Comment   Total CK 276 (*) 7 -  232 (U/L)    CK, MB 5.5 (*) 0.3 - 4.0 (ng/mL)    Relative Index 2.0  0.0 - 2.5    POCT I-STAT TROPONIN I     Status: Normal   Collection Time   07/16/11  3:30 AM      Component Value Range Comment   Troponin i, poc 0.01  0.00 - 0.08 (ng/mL)    Comment 3            POCT I-STAT TROPONIN I     Status: Normal   Collection Time   07/16/11  9:33 AM      Component Value Range Comment   Troponin i, poc 0.00  0.00 - 0.08 (ng/mL)    Comment 3            PROTIME-INR     Status: Normal   Collection Time   07/16/11  1:07 PM      Component Value Range Comment   Prothrombin Time 13.0  11.6 - 15.2 (seconds)    INR 0.96  0.00 - 1.49    APTT     Status: Normal   Collection Time   07/16/11  1:07 PM      Component Value Range Comment   aPTT 25  24 - 37 (seconds)   CBC     Status: Normal   Collection Time   07/16/11  1:07 PM      Component Value Range Comment   WBC 10.0  4.0 - 10.5 (K/uL)    RBC 4.38  4.22 - 5.81 (MIL/uL)    Hemoglobin 13.6  13.0 - 17.0 (g/dL)    HCT 39.4  39.0 - 52.0 (%)    MCV 90.0  78.0 - 100.0 (fL)    MCH 31.1  26.0 - 34.0 (pg)    MCHC 34.5  30.0 - 36.0 (g/dL)    RDW 12.9  11.5 - 15.5 (%)    Platelets 261  150 - 400 (K/uL)   DIFFERENTIAL     Status: Abnormal   Collection Time   07/16/11  1:07 PM      Component Value Range Comment   Neutrophils  Relative 69  43 - 77 (%)    Neutro Abs 6.8  1.7 - 7.7 (K/uL)    Lymphocytes Relative 18  12 - 46 (%)    Lymphs Abs 1.8  0.7 - 4.0 (K/uL)    Monocytes Relative 11  3 - 12 (%)    Monocytes Absolute 1.1 (*) 0.1 - 1.0 (K/uL)    Eosinophils Relative 2  0 - 5 (%)    Eosinophils Absolute 0.2  0.0 - 0.7 (K/uL)    Basophils Relative 1  0 - 1 (%)    Basophils Absolute 0.1  0.0 - 0.1 (K/uL)   TSH     Status: Normal   Collection Time   07/16/11  1:07 PM      Component Value Range Comment   TSH 1.212  0.350 - 4.500 (uIU/mL)   COMPREHENSIVE METABOLIC PANEL     Status: Abnormal   Collection Time   07/16/11  1:07 PM      Component Value Range Comment   Sodium 136  135 - 145 (mEq/L)    Potassium 4.1  3.5 - 5.1 (mEq/L)    Chloride 102  96 - 112 (mEq/L)    CO2 24  19 - 32 (mEq/L)    Glucose, Bld 96  70 - 99 (mg/dL)    BUN 16  6 -  23 (mg/dL)    Creatinine, Ser 1.28  0.50 - 1.35 (mg/dL)    Calcium 9.5  8.4 - 10.5 (mg/dL)    Total Protein 6.5  6.0 - 8.3 (g/dL)    Albumin 3.8  3.5 - 5.2 (g/dL)    AST 19  0 - 37 (U/L)    ALT 16  0 - 53 (U/L)    Alkaline Phosphatase 28 (*) 39 - 117 (U/L)    Total Bilirubin 0.6  0.3 - 1.2 (mg/dL)    GFR calc non Af Amer 58 (*) >90 (mL/min)    GFR calc Af Amer 68 (*) >90 (mL/min)   MAGNESIUM     Status: Normal   Collection Time   07/16/11  1:07 PM      Component Value Range Comment   Magnesium 2.1  1.5 - 2.5 (mg/dL)   CARDIAC PANEL(CRET KIN+CKTOT+MB+TROPI)     Status: Normal   Collection Time   07/16/11  3:15 PM      Component Value Range Comment   Total CK 171  7 - 232 (U/L)    CK, MB 3.1  0.3 - 4.0 (ng/mL)    Troponin I <0.30  <0.30 (ng/mL)    Relative Index 1.8  0.0 - 2.5    CARDIAC PANEL(CRET KIN+CKTOT+MB+TROPI)     Status: Normal   Collection Time   07/16/11  7:05 PM      Component Value Range Comment   Total CK 169  7 - 232 (U/L)    CK, MB 3.0  0.3 - 4.0 (ng/mL)    Troponin I <0.30  <0.30 (ng/mL)    Relative Index 1.8  0.0 - 2.5    HEPARIN LEVEL  (UNFRACTIONATED)     Status: Normal   Collection Time   07/16/11  9:08 PM      Component Value Range Comment   Heparin Unfractionated 0.46  0.30 - 0.70 (IU/mL)   CARDIAC PANEL(CRET KIN+CKTOT+MB+TROPI)     Status: Normal   Collection Time   07/17/11 12:33 AM      Component Value Range Comment   Total CK 145  7 - 232 (U/L)    CK, MB 2.7  0.3 - 4.0 (ng/mL)    Troponin I <0.30  <0.30 (ng/mL)    Relative Index 1.9  0.0 - 2.5    HEPARIN LEVEL (UNFRACTIONATED)     Status: Normal   Collection Time   07/17/11 12:36 AM      Component Value Range Comment   Heparin Unfractionated 0.57  0.30 - 0.70 (IU/mL)   CBC     Status: Abnormal   Collection Time   07/17/11 12:36 AM      Component Value Range Comment   WBC 8.9  4.0 - 10.5 (K/uL)    RBC 4.11 (*) 4.22 - 5.81 (MIL/uL)    Hemoglobin 12.6 (*) 13.0 - 17.0 (g/dL)    HCT 36.8 (*) 39.0 - 52.0 (%)    MCV 89.5  78.0 - 100.0 (fL)    MCH 30.7  26.0 - 34.0 (pg)    MCHC 34.2  30.0 - 36.0 (g/dL)    RDW 12.8  11.5 - 15.5 (%)    Platelets 222  150 - 400 (K/uL)   HEPARIN LEVEL (UNFRACTIONATED)     Status: Normal   Collection Time   07/17/11  5:40 AM      Component Value Range Comment   Heparin Unfractionated 0.51  0.30 - 0.70 (IU/mL)     Imaging: Imaging  results have been reviewed  Assessment/Plan:   1. Active Problems: 2.  Unstable angina 3.  Dyslipidemia 4.  History of smoking 5.  Family history of coronary artery bypass surgery 6.  Crohn disease 7.  Peptic ulcer disease, endo 2011 8.   Time Spent Directly with Patient:  20 minutes  Length of Stay:  LOS: 2 days   Pt on IV heparin. C/O some mild left CP last PM. Currently pain free. Enz neg. Will add low dose IV NTG (10 ug). Plan for cath Tuesday.   Lorretta Harp 07/17/2011, 9:01 AM

## 2011-07-18 LAB — HEPARIN LEVEL (UNFRACTIONATED): Heparin Unfractionated: 0.41 IU/mL (ref 0.30–0.70)

## 2011-07-18 LAB — CBC
HCT: 34.9 % — ABNORMAL LOW (ref 39.0–52.0)
Hemoglobin: 12 g/dL — ABNORMAL LOW (ref 13.0–17.0)
MCH: 30.4 pg (ref 26.0–34.0)
MCHC: 34.4 g/dL (ref 30.0–36.0)
MCV: 88.4 fL (ref 78.0–100.0)
Platelets: 215 10*3/uL (ref 150–400)
RBC: 3.95 MIL/uL — ABNORMAL LOW (ref 4.22–5.81)
RDW: 12.7 % (ref 11.5–15.5)
WBC: 9.1 10*3/uL (ref 4.0–10.5)

## 2011-07-18 MED ORDER — METOPROLOL TARTRATE 12.5 MG HALF TABLET
12.5000 mg | ORAL_TABLET | Freq: Two times a day (BID) | ORAL | Status: DC
  Administered 2011-07-18 (×2): 12.5 mg via ORAL
  Filled 2011-07-18 (×4): qty 1

## 2011-07-18 MED ORDER — DIAZEPAM 5 MG PO TABS
5.0000 mg | ORAL_TABLET | ORAL | Status: AC
  Administered 2011-07-19: 5 mg via ORAL
  Filled 2011-07-18: qty 1

## 2011-07-18 MED ORDER — SODIUM CHLORIDE 0.9 % IV SOLN: 250.0000 mL | INTRAVENOUS | Status: DC | PRN

## 2011-07-18 MED ORDER — SODIUM CHLORIDE 0.9 % IV SOLN
1.0000 mL/kg/h | INTRAVENOUS | Status: DC
  Administered 2011-07-18: 1 mL/kg/h via INTRAVENOUS

## 2011-07-18 MED ORDER — SODIUM CHLORIDE 0.9 % IJ SOLN: 3.0000 mL | INTRAMUSCULAR | Status: DC | PRN

## 2011-07-18 NOTE — Progress Notes (Signed)
ANTICOAGULATION CONSULT NOTE - Follow Up Consult  Pharmacy Consult for Heparin Indication: chest pain/ACS  No Known Allergies  Patient Measurements: Height: 5' 8"  (172.7 cm) Weight: 181 lb (82.101 kg) IBW/kg (Calculated) : 68.4  Heparin Dosing Weight: 82 kg  Vital Signs: Temp: 99 F (37.2 C) (05/27 0513) Temp src: Oral (05/27 0513) BP: 135/83 mmHg (05/27 1142) Pulse Rate: 74  (05/27 1142)  Labs:  Flo Shanks 07/18/11 0535 07/17/11 0540 07/17/11 0036 07/17/11 0033 07/16/11 1905 07/16/11 1515 07/16/11 1307 07/15/11 1710  HGB 12.0* -- 12.6* -- -- -- -- --  HCT 34.9* -- 36.8* -- -- -- 39.4 --  PLT 215 -- 222 -- -- -- 261 --  APTT -- -- -- -- -- -- 25 --  LABPROT -- -- -- -- -- -- 13.0 13.3  INR -- -- -- -- -- -- 0.96 0.99  HEPARINUNFRC 0.41 0.51 0.57 -- -- -- -- --  CREATININE -- -- -- -- -- -- 1.28 0.93  CKTOTAL -- -- -- 145 169 171 -- --  CKMB -- -- -- 2.7 3.0 3.1 -- --  TROPONINI -- -- -- <0.30 <0.30 <0.30 -- --    Estimated Creatinine Clearance: 62.5 ml/min (by C-G formula based on Cr of 1.28).    Assessment: 60 YOM on heparin for ACS.  Heparin level therapeutic and stable, no bleeding reported.  Noted patient has chest pain last PM and has resolved since nitroglycerin gtt added.   Goal of Therapy:  Heparin level 0.3-0.7 units/ml Monitor platelets by anticoagulation protocol: Yes     Plan:  - Continue heparin gtt at 1200 units/hr - Daily HL / CBC - F/U post cath in AM     Maury Bamba D. Mina Marble, PharmD, BCPS Pager:  [phone] 07/18/2011, 12:57 PM

## 2011-07-18 NOTE — Progress Notes (Signed)
Subjective:  Some chest pain last night, IV NTG started. He is pain free now.  Objective:  Vital Signs in the last 24 hours: Temp:  [97.8 F (36.6 C)-99 F (37.2 C)] 99 F (37.2 C) (05/27 0513) Pulse Rate:  [58-61] 58  (05/27 0513) Resp:  [17-19] 19  (05/27 0513) BP: (104-141)/(57-80) 104/57 mmHg (05/27 0513) SpO2:  [97 %-98 %] 97 % (05/27 0513)  Intake/Output from previous day:  Intake/Output Summary (Last 24 hours) at 07/18/11 0825 Last data filed at 07/18/11 0600  Gross per 24 hour  Intake    881 ml  Output    450 ml  Net    431 ml    Physical Exam: General appearance: alert, cooperative and no distress Lungs: clear to auscultation bilaterally Heart: regular rate and rhythm   Rate: 55-120  Rhythm: normal sinus rhythm, sinus tachycardia and sinus bradycardia  Lab Results:  Basename 07/18/11 0535 07/17/11 0036  WBC 9.1 8.9  HGB 12.0* 12.6*  PLT 215 222    Basename 07/16/11 1307 07/15/11 1710  NA 136 136  K 4.1 3.7  CL 102 100  CO2 24 26  GLUCOSE 96 79  BUN 16 12  CREATININE 1.28 0.93    Basename 07/17/11 0033 07/16/11 1905  TROPONINI <0.30 <0.30   Hepatic Function Panel  Basename 07/16/11 1307  PROT 6.5  ALBUMIN 3.8  AST 19  ALT 16  ALKPHOS 28*  BILITOT 0.6  BILIDIR --  IBILI --   No results found for this basename: CHOL in the last 72 hours  Basename 07/16/11 1307  INR 0.96    Imaging: Imaging results have been reviewed  Cardiac Studies:  Assessment/Plan:   Active Problems:  Unstable angina  Dyslipidemia  History of smoking  Family history of coronary artery bypass surgery  Crohn disease  Peptic ulcer disease, endo 2011  Plan- Cath in am. Beta blocker was not added on admission because of sinus bradycardia. He has had sinus tachycardia when up. Will try low dose Metoprolol.    Kerin Ransom PA-C 07/18/2011, 8:25 AM    Agree with note written by Kerin Ransom PAC  No CP since starting low dose IV NTG. Exam benign. For cath  tomorrow with Dr. Shelby Mattocks.  Quay Burow J 07/18/2011 8:32 AM

## 2011-07-18 NOTE — Progress Notes (Signed)
Pt watched cath video with family.  Cephus Richer RN

## 2011-07-19 ENCOUNTER — Encounter (HOSPITAL_COMMUNITY): Payer: Self-pay | Admitting: General Practice

## 2011-07-19 ENCOUNTER — Encounter (HOSPITAL_COMMUNITY)
Admission: EM | Disposition: A | Discharge status: Home / Self Care | Payer: Self-pay | Source: Home / Self Care | Attending: Cardiovascular Disease

## 2011-07-19 DIAGNOSIS — I2510 Atherosclerotic heart disease of native coronary artery without angina pectoris: Secondary | ICD-10-CM

## 2011-07-19 HISTORY — PX: LEFT HEART CATHETERIZATION WITH CORONARY ANGIOGRAM: SHX5451

## 2011-07-19 HISTORY — DX: Atherosclerotic heart disease of native coronary artery without angina pectoris: I25.10

## 2011-07-19 LAB — CBC
HCT: 34.5 % — ABNORMAL LOW (ref 39.0–52.0)
Hemoglobin: 11.9 g/dL — ABNORMAL LOW (ref 13.0–17.0)
MCH: 30.7 pg (ref 26.0–34.0)
MCHC: 34.5 g/dL (ref 30.0–36.0)
MCV: 88.9 fL (ref 78.0–100.0)
Platelets: 207 10*3/uL (ref 150–400)
RBC: 3.88 MIL/uL — ABNORMAL LOW (ref 4.22–5.81)
RDW: 12.8 % (ref 11.5–15.5)
WBC: 7.5 10*3/uL (ref 4.0–10.5)

## 2011-07-19 LAB — BASIC METABOLIC PANEL
BUN: 11 mg/dL (ref 6–23)
CO2: 26 mEq/L (ref 19–32)
Calcium: 9 mg/dL (ref 8.4–10.5)
Chloride: 106 mEq/L (ref 96–112)
Creatinine, Ser: 0.9 mg/dL (ref 0.50–1.35)
GFR calc Af Amer: >90 mL/min (ref >90)
GFR calc non Af Amer: 89 mL/min — ABNORMAL LOW (ref >90)
Glucose, Bld: 104 mg/dL — ABNORMAL HIGH (ref 70–99)
Potassium: 3.9 mEq/L (ref 3.5–5.1)
Sodium: 140 mEq/L (ref 135–145)

## 2011-07-19 LAB — HEPARIN LEVEL (UNFRACTIONATED): Heparin Unfractionated: 0.32 IU/mL (ref 0.30–0.70)

## 2011-07-19 SURGERY — Surgical Case

## 2011-07-19 MED ORDER — HEPARIN SODIUM (PORCINE) 1000 UNIT/ML IJ SOLN
INTRAMUSCULAR | Status: AC
  Filled 2011-07-19: qty 1

## 2011-07-19 MED ORDER — NITROGLYCERIN 0.2 MG/ML ON CALL CATH LAB
INTRAVENOUS | Status: AC
  Filled 2011-07-19: qty 1

## 2011-07-19 MED ORDER — LIDOCAINE HCL (PF) 1 % IJ SOLN
INTRAMUSCULAR | Status: AC
  Filled 2011-07-19: qty 30

## 2011-07-19 MED ORDER — NITROGLYCERIN 0.4 MG SL SUBL: 0.4000 mg | SUBLINGUAL_TABLET | SUBLINGUAL | Status: DC | PRN

## 2011-07-19 MED ORDER — FENTANYL CITRATE 0.05 MG/ML IJ SOLN
INTRAMUSCULAR | Status: AC
  Filled 2011-07-19: qty 2

## 2011-07-19 MED ORDER — PANTOPRAZOLE SODIUM 40 MG PO TBEC: 40.0000 mg | DELAYED_RELEASE_TABLET | Freq: Every day | ORAL | Status: DC

## 2011-07-19 MED ORDER — MIDAZOLAM HCL 2 MG/2ML IJ SOLN
INTRAMUSCULAR | Status: AC
  Filled 2011-07-19: qty 2

## 2011-07-19 MED ORDER — METOPROLOL SUCCINATE ER 25 MG PO TB24
ORAL_TABLET | ORAL | Status: DC
Start: 2011-07-19 — End: 2012-07-09

## 2011-07-19 MED ORDER — CLOPIDOGREL BISULFATE 75 MG PO TABS
75.0000 mg | ORAL_TABLET | Freq: Every day | ORAL | Status: DC
  Administered 2011-07-19: 75 mg via ORAL
  Filled 2011-07-19: qty 1

## 2011-07-19 MED ORDER — CLOPIDOGREL BISULFATE 75 MG PO TABS: 75.0000 mg | ORAL_TABLET | Freq: Every day | ORAL | Status: DC

## 2011-07-19 MED ORDER — HEPARIN (PORCINE) IN NACL 2-0.9 UNIT/ML-% IJ SOLN
INTRAMUSCULAR | Status: AC
  Filled 2011-07-19: qty 2000

## 2011-07-19 MED ORDER — SODIUM CHLORIDE 0.9 % IV SOLN: 1.0000 mL/kg/h | INTRAVENOUS | Status: DC

## 2011-07-19 MED ORDER — ASPIRIN 81 MG PO TABS: 81.0000 mg | ORAL_TABLET | Freq: Every day | ORAL | Status: AC

## 2011-07-19 MED ORDER — MORPHINE SULFATE 2 MG/ML IJ SOLN: 2.0000 mg | INTRAMUSCULAR | Status: DC | PRN

## 2011-07-19 NOTE — CV Procedure (Signed)
[place]     CARDIAC CATHETERIZATION REPORT  NAME: Frederick Rush   MRN: [id number] DOB: 02/16/1951   ADMIT DATE:  07/15/2011  Performing Cardiologist: Leonie Man  Primary Physician: William Hamburger, MD, MD Primary Cardiologist:  Quay Burow, MD  Procedures Performed:  Left Heart Catheterization via 5 Fr Right Radial Artery access  Left Ventriculography, (RAO) 12.5 ml/sec for 25 ml total contrast  Native Coronary Angiography  Indication(s): Chest pain at rest - concerning for unstable angina  History: 61 y.o. male with a  PMH notable for Chron's disease no prior history of CAD, admitted yesterday to the ER from his primary care MD with chest pain and a history of diaphoresis. The pt says Thursday prior to admission he had an episode of spontaneous diaphoresis at work. He had no chest pain. Friday he had another episode of diaphoresis followed by some back pain that migrated to his Lt chest. He was sent to the ER for further evaluation. Coronary CT is abnormal and the patient was admitted now for for further evaluation & treatment.  He has had intermittent symptoms relieved with IV Heparing & NTG. He is now referred for invasive coronary angiography.   Consent: The procedure with Risks/Benefits/Alternatives and Indications was reviewed with the patient (and family).  All questions were answered.    Risks / Complications include, but not limited to: Death, MI, CVA/TIA, VF/VT (with defibrillation), Bradycardia (need for temporary pacer placement), contrast induced nephropathy, bleeding / bruising / hematoma / pseudoaneurysm, vascular or coronary injury (with possible emergent CT or Vascular Surgery), adverse medication reactions, infection.    The patient (and family) voice understanding and agree to proceed.    Consent for signed by MD and patient with RN witness -- placed on chart.  Procedure: The patient was brought to the [facility]nd [place]  Cardiac Catheterization Lab in the fasting state and prepped and draped in the usual sterile fashion for Right groin or radial) access. A modified Allen's test with plethysmography was performed on the right wrist demonstrating adequate Ulnar Artery collateral flow.    Sterile technique was used including antiseptics, cap, gloves, gown, hand hygiene, mask and sheet.  Skin prep: Chlorhexidine;  Time Out: Verified patient identification, verified procedure, site/side was marked, verified correct patient position, special equipment/implants available, medications/allergies/relevent history reviewed, required imaging and test results available.  Performed  The right wrist was anesthetized with 1% subcutaneous Lidocaine.  The right radial artery was accessed using the Seldinger Technique with placement of a 5 Fr Glide Sheath. The sheath was aspirated and flushed.  Then a total of 5 ml of standard Radial Artery Cocktail (see medications) was infused.  Radial Cocktail: 2.5 mg Nicardipine, 400 mcg NTG, 2 ml 2% Lidocaine  A 5 Fr TIG Catheter was advanced of over a Chartered loss adjuster [employer] J wire into the ascending Aorta and was used to engage the Left Coronary Artery.  Multiple cineangiographic views of the Left coronary artery system were performed.  A 5 Fr JR4 Catheter was advanced of over a Web designer J wire into the ascending Aorta and was used to engage the Right coronary artery.  Multiple cineangiographic views of the Right coronary artery system(s) were performed. This catheter was then exchanged over the Long [employer] J wire for an angled Pigtail catheter that was advanced across the Aortic Valve.  LV hemodynamics were measured and Left Ventriculography was performed.  LV hemodynamics were then re-sampled, and the catheter  was pulled back across the Aortic Valve for measurement of "pull-back" gradient.  The catheter and wire were removed completely out of the body.  The sheath  was removed in the Cath Lab with a TR band placed at 16 ml Air at 0945 (time).  Reverse Allen's test revealed non-occlusive hemostasis.  The patient was transported to the PACU holding area in a hemodynamically stable, chest pain free condition.   The patient  was stable before, during and following the procedure.   Patient did tolerate procedure well. There were not complications.  EBL: < 10 ml  Medications:  Premedication: 5 mg  Valium,   Sedation:  1 mg IV Versed, 50 mcg IV Fentanyl  Contrast:  80 ml Omnipaque  IV Heparin 4000 Units  Radial Cocktail: 2.5 mg Nicardipine, 400 mcg NTG, 2 ml 2% Lidocaine in 10 ml -- 37m given  Hemodynamics:  Central Aortic Pressure / Mean Aortic Pressure: 127/66 mmHg; 91 mHg  LV Pressure / LV End diastolic Pressure:  1009/3mmHg; 19 mmHg  Left Ventriculography:  EF:  60-65  Wall Motion: Normal  Coronary Angiographic Data:  Left Main:  Large caliber; bifurcates into LAD & Letf Circumflex  Left Anterior Descending (LAD):  Moderate to large caliber vessel with 2 major diagonal branches; ~20% proximal, 30% mid & tubular ~40% distal lesions -- all non-obstructive. The vessel tapers down & around the apex.  1st diagonal (D1):  Moderate caliber vessel; ostial/proximal ~50%  2nd diagonal (D2):  Moderate caliber vessel; no angiographically significant disease  Circumflex (LCx):  Non-dominant moderate vessel that courses as a large distally branching OM with a proximal atrial branch and very small AV Groove branch.  Right Coronary Artery: Large caliber dominant artery; mid vessel there is a ~127msegment with irregular ~30-50% stenosis that is not flow limiting followed by a ~40% lesion at the 3rd bend of the vessel; the remainder of the distal RCA is free of angiographically significant lesions. 2 small caliber RV marginal branches - the 2nd covers a large distribution.  Posterior Descending Artery: Moderate caliber vessel that reaches ~2/3 of the way to  the apex  Posterior Atrioventricular Branch:  Moderate caliber vessel that gives rise to 1 very small & 2 small to moderate size Posterolateral Branches and the AV Nodal artery; no angiographically significant disease.  Impression:  Non obstructive CAD - most notable lesions in the Mid RCA but less so in the LAD.  Potential etiology of his CP could be the RCA lesion that has stabilized with IV Heparin.    Preserved LVEF with normal LVEDP and no regional WMA.  Plan:  Standard post Radial Cath monitoring.    Can d/c Heparin& NTG, but would d/c with PRN NTG as he did have resting CP.    Will also discharge on ASA & Plavix for short term as well as Statin  Will discharge later today with plan f/u with Dr. BeGwenlyn Found  The case and results was discussed with the patient and family. The case and results was discussed with the patient's Cardiologist.  Time Spend Directly with Patient:  35 minutes  Zia Najera W, M.D., M.S. [facility] [id number] [place]  [id number]/28/2013 10:14 AM

## 2011-07-19 NOTE — Discharge Instructions (Signed)
Call [facility] if any bleeding, swelling or drainage at cath site.  May shower, no tub baths for 48 hours for groin sticks.   No Lifting for 5 days No driving for 3 days  Heart Healthy dietAngina Angina is chest discomfort caused by lack of oxygen to the heart muscle. It is a warning sign that there is a blood flow problem to your heart. Angina is referred to as either stable or unstable. Stable angina often happens with the same kind of activity, lasts a few minutes, and feels the same each time. Unstable angina has no pattern, no warning, lasts longer, and is more serious. Unstable angina might predict a heart attack. HOME CARE   Understand how to take your medicine and what side effects to expect.   Do not stop the medicines.   Do not change how much you take (dosage) on your own.   Write down any side effects. Tell your doctor what they are.   Mild exercise may help. Start exercising only as told by your doctor.   You can still have a sexual relationship if it does not cause angina. Tell your doctor if it does.   Stop smoking. Do not use gum or patches that help people quit smoking until you check with your doctor.   Lose weight if you are overweight. Eat a heart-healthy diet that is low in fat and salt.   Keep all follow-up visits with your doctor. This is important!  GET HELP RIGHT AWAY IF:   Your angina seems to happen more often or lasts longer.   You are having side effects from your medicine.   Your chest pain spreads to the arms, back, neck, or jaw (especially if the pain is crushing or pressure-like).   You are sweating, feel sick to your stomach (nauseous), or have shortness of breath.   You have an attack that does not get better after rest or taking medicine.   You wake from sleep with chest pain.   You feel dizzy, faint, or feel very tired (fatigued).   You have chest pain that is different from your usual angina.  Any of  these problems may be a sign of a serious problem that is an emergency. Do not wait to see if the problems will go away. Get medical help right away. Call your local emergency services (911 in [place] Do not drive yourself to the hospital. MAKE SURE YOU:   Understand these instructions.   Will watch your condition.   Will get help right away if you are not doing well or get worse.  Document Released: 07/27/2007 Document Revised: 01/27/2011 Document Reviewed: 07/27/2007 [facility] Patient Information 2012 [place].

## 2011-07-19 NOTE — Progress Notes (Signed)
Subjective:  No further CP since IV NTG.  Objective:  Vital Signs in the last 24 hours: Temp:  [97.5 F (36.4 C)-98.6 F (37 C)] 97.6 F (36.4 C) (05/28 0637) Pulse Rate:  [56-74] 60  (05/28 0637) Resp:  [18] 18  (05/28 0637) BP: (125-135)/(66-83) 130/77 mmHg (05/28 0637) SpO2:  [96 %-99 %] 96 % (05/28 0637)  Intake/Output from previous day:  Intake/Output Summary (Last 24 hours) at 07/19/11 0821 Last data filed at 07/18/11 1757  Gross per 24 hour  Intake   1080 ml  Output    500 ml  Net    580 ml    Physical Exam: General appearance: alert, cooperative and no distress Lungs: clear to auscultation bilaterally Heart: regular rate and rhythm Neck: no adenopathy, no carotid bruit, no JVD, supple, symmetrical, trachea midline and thyroid not enlarged, symmetric, no tenderness/mass/nodules Abdomen: soft, non-tender; bowel sounds normal; no masses,  no organomegaly Pulses: 2+ and symmetric Neurologic: Grossly normal; CN II-XII grossly intact   Rate: 60  Rhythm: NSR  Lab Results:  Basename 07/19/11 0557 07/18/11 0535  WBC 7.5 9.1  HGB 11.9* 12.0*  PLT 207 215    Basename 07/19/11 0557 07/16/11 1307  NA 140 136  K 3.9 4.1  CL 106 102  CO2 26 24  GLUCOSE 104* 96  BUN 11 16  CREATININE 0.90 1.28    Basename 07/17/11 0033 07/16/11 1905  TROPONINI <0.30 <0.30   Hepatic Function Panel  Basename 07/16/11 1307  PROT 6.5  ALBUMIN 3.8  AST 19  ALT 16  ALKPHOS 28*  BILITOT 0.6  BILIDIR --  IBILI --   No results found for this basename: CHOL in the last 72 hours  Basename 07/16/11 1307  INR 0.96    Imaging: Imaging results have been reviewed  Cardiac Studies:  Assessment/Plan:   Principal Problem:  *Unstable angina Active Problems:  History of smoking  Family history of coronary artery bypass surgery  Crohn disease  Peptic ulcer disease, endo 2011  Dyslipidemia  Low dose Beta blocker was added yesterday due to Sinus tachycardia when up.  No CP  since starting low dose IV NTG.. Continue Statin Chron's Dz -stable; no bleed on Heparin gtt.   The procedure with Risks/Benefits/Alternatives and Indications was reviewed with the patient.  All questions were answered.    Risks / Complications include, but not limited to: Death, MI, CVA/TIA, VF/VT (with defibrillation), Bradycardia (need for temporary pacer placement), contrast induced nephropathy, bleeding / bruising / hematoma / pseudoaneurysm, vascular or coronary injury (with possible emergent CT or Vascular Surgery), adverse medication reactions, infection.    The patient (and family) voiced understanding and agree to proceed.       Haroldine Redler W 07/19/2011 8:21 AM

## 2011-07-19 NOTE — Progress Notes (Signed)
Pt HR dropped down to 37, but did not sustain. Pt asymptomatic, B/P 125/66, HR came up to 56. HR fluctuating from high 40's-high 50's. Will continue to monitor.

## 2011-07-19 NOTE — Progress Notes (Signed)
TR BAND REMOVAL  LOCATION:  right radial  DEFLATED PER PROTOCOL:  yes  TIME BAND OFF / DRESSING APPLIED:   1400   SITE UPON ARRIVAL:   Level 0  SITE AFTER BAND REMOVAL:  Level 0  REVERSE ALLEN'S TEST:    positive  CIRCULATION SENSATION AND MOVEMENT:  Within Normal Limits  yes  COMMENTS:

## 2011-07-19 NOTE — Progress Notes (Signed)
Pt HR dropped down to 38, pt asymptomatic, VS stable. MD on call notified, no orders given. Will continue to monitor.

## 2011-07-20 ENCOUNTER — Telehealth: Payer: Self-pay

## 2011-07-20 MED ORDER — MELOXICAM 15 MG PO TABS: 15.0000 mg | ORAL_TABLET | Freq: Every day | ORAL | Status: DC

## 2011-07-20 MED ORDER — ROSUVASTATIN CALCIUM 20 MG PO TABS: 20.0000 mg | ORAL_TABLET | Freq: Every evening | ORAL | Status: DC

## 2011-07-20 NOTE — Telephone Encounter (Signed)
Pt just left hospital,needs refills for his crestor,meloxicam,   Best phone [phone]  [facility] [address] [address]

## 2011-07-20 NOTE — Telephone Encounter (Signed)
Sent to pharmacy. Recommend office visit for additional refills

## 2011-07-20 NOTE — Discharge Summary (Signed)
Physician Discharge Summary  Patient ID: Frederick Rush MRN: [id number] DOB/AGE: 08/23/1950 61 y.o.  Admit date: 07/15/2011 Discharge date: 07/19/2011  Discharge Diagnoses:  Principal Problem:  *Unstable angina Active Problems:  CAD (coronary artery disease), non obstructive by cath 07/19/11, treat medically  Dyslipidemia  History of smoking  Family history of coronary artery bypass surgery  Crohn disease  Peptic ulcer disease, endo 2011   Discharged Condition: good  Procedure:  07/19/2011 cardiac cath by Dr. Ellyn Hack.  Hospital Course: 61 y/o male with no prior history of CAD, admitted 07/16/11 to the ER from his primary care MD with chest pain and a history of diaphoresis. The pt says Thursday prior to admission he had an episode of spontaneous diaphoresis at work. He had no chest pain. Friday he had another episode of diaphoresis followed by some back pain that migrated to his Lt chest. He was sent to the ER for further evaluation. Coronary CT is abnormal and the patient was admitted for further evaluation.  EKG without acute changes and negative cardiac enzymes.  Placed on IV heparin.  Plan for cardiac cath on 07/19/11.  On the 26th of May he had recurrent chest pain, IV NTG was added to his medical regimen.  No further chest pain with IV NTG.  Pt. Was bradycardic on admit and betablocker held.  Heart rate improved by the 27th and metoprolol added.  By the 28th pt. Underwent cardiac cath with results:  Hemodynamics:  Central Aortic Pressure / Mean Aortic Pressure: 127/66 mmHg; 91 mHg  LV Pressure / LV End diastolic Pressure: 956/3 mmHg; 19 mmHg  Left Ventriculography:  EF: 60-65  Wall Motion: Normal  Coronary Angiographic Data:  Left Main: Large caliber; bifurcates into LAD & Letf Circumflex  Left Anterior Descending (LAD): Moderate to large caliber vessel with 2 major diagonal branches; ~20% proximal, 30% mid & tubular ~40% distal lesions -- all non-obstructive. The vessel tapers  down & around the apex.  1st diagonal (D1): Moderate caliber vessel; ostial/proximal ~50%  2nd diagonal (D2): Moderate caliber vessel; no angiographically significant disease  Circumflex (LCx): Non-dominant moderate vessel that courses as a large distally branching OM with a proximal atrial branch and very small AV Groove branch.  Right Coronary Artery: Large caliber dominant artery; mid vessel there is a ~15m segment with irregular ~30-50% stenosis that is not flow limiting followed by a ~40% lesion at the 3rd bend of the vessel; the remainder of the distal RCA is free of angiographically significant lesions. 2 small caliber RV marginal branches - the 2nd covers a large distribution.  Posterior Descending Artery: Moderate caliber vessel that reaches ~2/3 of the way to the apex  Posterior Atrioventricular Branch: Moderate caliber vessel that gives rise to 1 very small & 2 small to moderate size Posterolateral Branches and the AV Nodal artery; no angiographically significant disease.  D/c'd Heparin& NTG post cath, but d/c'd with PRN NTG as he did have resting CP. Also discharged on ASA & Plavix for short term as well as Statin.  Pt. Did well post cath and was discharged home.      Consults: None  Significant Diagnostic Studies:  Negative cardiac enzymes.  Na 140, K+ 3.9, Chloride 106, CO2 26, BUN 11, Cr. 0.90, Calcium 9.0, glucose 104.   H/H 11.9/34.5   Discharge Exam: Blood pressure 129/73, pulse 62, temperature 97.6 F (36.4 C), temperature source Oral, resp. rate 18, height 5' 8"  (1.727 m), weight 82.101 kg (181 lb), SpO2 99.00%.   Physical  Exam:  General appearance: alert, cooperative and no distress  Lungs: clear to auscultation bilaterally  Heart: regular rate and rhythm  Neck: no adenopathy, no carotid bruit, no JVD, supple, symmetrical, trachea midline and thyroid not enlarged, symmetric, no tenderness/mass/nodules  Abdomen: soft, non-tender; bowel sounds normal; no masses, no  organomegaly  Pulses: 2+ and symmetric  Neurologic: Grossly normal; CN II-XII grossly intact  Disposition: 01-Home or Self Care   Medication List  As of 07/20/2011 11:56 AM   TAKE these medications         APRISO 0.375 G 24 hr capsule   Generic drug: mesalamine   Take 1,500 mg by mouth every morning. 1550m=4 capsules      aspirin 81 MG tablet   Take 1 tablet (81 mg total) by mouth daily.      clopidogrel 75 MG tablet   Commonly known as: PLAVIX   Take 1 tablet (75 mg total) by mouth daily with breakfast.      meloxicam 15 MG tablet   Commonly known as: MOBIC   Take 15 mg by mouth daily.      metoprolol succinate 25 MG 24 hr tablet   Commonly known as: TOPROL-XL   Take half a 25 mg tablet for 12.5 mg daily.      nitroGLYCERIN 0.4 MG SL tablet   Commonly known as: NITROSTAT   Place 1 tablet (0.4 mg total) under the tongue every 5 (five) minutes x 3 doses as needed for chest pain.      pantoprazole 40 MG tablet   Commonly known as: PROTONIX   Take 1 tablet (40 mg total) by mouth daily at 6 (six) AM.      rosuvastatin 20 MG tablet   Commonly known as: CRESTOR   Take 20 mg by mouth every evening.           Follow-up Information    Follow up with HTarri Fuller PA on 08/02/2011. (at 3:30 pm  with Dr. HAllison QuarryPA)    Contact information:   [address] [phone]       Discharge instructions: [facility] if any bleeding, swelling or drainage at cath site.  May shower, no tub baths for 48 hours for groin sticks.   No Lifting for 5 days No driving for 3 days   Signed: IIsaiah Serge5/29/2013, 11:56 AM  [name] a cardiac catheterization via radial artery access with no evidence of obstructive CAD to explain his symptoms. He was otherwise stable for discharge after bed-rest was complete.  I agree with the summary above.  HLeonie Man M.D., M.S. [facility]  [facility] [address] [place]  [phone] # [phone] 07/20/2011 2:16 PM

## 2011-07-20 NOTE — Telephone Encounter (Signed)
Can we refill these?  I didn't see crestor on his med list.

## 2011-07-20 NOTE — Telephone Encounter (Signed)
Spoke with patient and let him know that rx was sent to pharmacy

## 2011-07-21 ENCOUNTER — Encounter: Payer: Self-pay | Admitting: Family Medicine

## 2011-07-22 MED FILL — Nicardipine HCl IV Soln 2.5 MG/ML: INTRAVENOUS | Qty: 1 | Status: AC

## 2011-09-03 ENCOUNTER — Ambulatory Visit (INDEPENDENT_AMBULATORY_CARE_PROVIDER_SITE_OTHER): Payer: BC Managed Care – PPO | Admitting: Emergency Medicine

## 2011-09-03 ENCOUNTER — Ambulatory Visit: Payer: BC Managed Care – PPO

## 2011-09-03 VITALS — BP 128/84 | HR 82 | Temp 97.9°F | Resp 18 | Ht 68.0 in | Wt 184.0 lb

## 2011-09-03 DIAGNOSIS — S9031XA Contusion of right foot, initial encounter: Secondary | ICD-10-CM

## 2011-09-03 DIAGNOSIS — S9030XA Contusion of unspecified foot, initial encounter: Secondary | ICD-10-CM

## 2011-09-03 NOTE — Progress Notes (Signed)
  Subjective:    Patient ID: Frederick Rush, male    DOB: [birth year], 61 y.o.   MRN: [id number]  HPI patient was in good health until last Sunday when he walked out of his shed and stepped on the corner of a concrete wall. Since then he has had exquisite pain over the distal portion of the foot and base of the third toe.    Review of Systems     Objective:   Physical Exam there is significant tenderness at the base of the third toe. There is significant tenderness at the distal portion of the third metatarsal. There is no swelling noted dorsalis pedis and posterior tibial pulses are 2+. There is a large bunion noted.  [facility] reading (PRIMARY) by  Dr. Terrall Laity        Assessment & Plan:  The patient has a contusion to the base of his foot. We'll treat this with ice metatarsal plate , and see how he does over the next couple weeks.

## 2012-02-25 ENCOUNTER — Ambulatory Visit (INDEPENDENT_AMBULATORY_CARE_PROVIDER_SITE_OTHER): Payer: BC Managed Care – PPO | Admitting: Emergency Medicine

## 2012-02-25 VITALS — BP 134/80 | HR 84 | Temp 98.7°F | Resp 18 | Wt 187.0 lb

## 2012-02-25 DIAGNOSIS — R059 Cough, unspecified: Secondary | ICD-10-CM

## 2012-02-25 DIAGNOSIS — J329 Chronic sinusitis, unspecified: Secondary | ICD-10-CM

## 2012-02-25 DIAGNOSIS — R05 Cough: Secondary | ICD-10-CM

## 2012-02-25 DIAGNOSIS — R0981 Nasal congestion: Secondary | ICD-10-CM

## 2012-02-25 DIAGNOSIS — J019 Acute sinusitis, unspecified: Secondary | ICD-10-CM

## 2012-02-25 LAB — POCT INFLUENZA A/B
Influenza A, POC: NEGATIVE
Influenza B, POC: NEGATIVE

## 2012-02-25 MED ORDER — CEFDINIR 300 MG PO CAPS: 300.0000 mg | ORAL_CAPSULE | Freq: Two times a day (BID) | ORAL | Status: DC

## 2012-02-25 NOTE — Patient Instructions (Signed)

## 2012-02-25 NOTE — Progress Notes (Signed)
  Subjective:    Patient ID: Frederick Rush, male    DOB: [birth year], 62 y.o.   MRN: [id number]  HPI patient enters with head congestion right facial pain and swelling hoarseness and a cough which has been productive at times of the color type phlegm. He has a history of pneumonia and coronary disease    Review of Systems     Objective:   Physical Exam TMs are clear. The nose is congested. There is a puffiness on the right side of the face with a purulent right sided nasal drainage. Neck is otherwise supple his chest is clear to auscultation and percussion.  Results for orders placed in visit on 02/25/12  POCT INFLUENZA A/B      Component Value Range   Influenza A, POC Negative     Influenza B, POC Negative          Assessment & Plan:  Check a flu swab. It appears she does have a right maxillary sinusitis by exam. We'll treat with Omnicef 300 mg two a day.

## 2012-07-09 ENCOUNTER — Telehealth: Payer: Self-pay | Admitting: Cardiovascular Disease

## 2012-07-09 MED ORDER — ROSUVASTATIN CALCIUM 20 MG PO TABS: 20.0000 mg | ORAL_TABLET | Freq: Every evening | ORAL | Status: DC

## 2012-07-09 MED ORDER — METOPROLOL SUCCINATE ER 25 MG PO TB24: ORAL_TABLET | ORAL | Status: DC

## 2012-07-09 NOTE — Telephone Encounter (Signed)
Need new prescriptions for Crestor20 mg #90 ond Metropolol 44m#16-Call to [facility]-[phone]. Please let him know when you call this in!

## 2012-07-10 ENCOUNTER — Telehealth: Payer: Self-pay | Admitting: Cardiovascular Disease

## 2012-07-10 NOTE — Telephone Encounter (Signed)
Mr. Whorley says that [place] is telling him that the new law is that we have to call [place] to refill his medication for Crestor 63m..Marland KitchenMarland Kitchenalmart says that they have sent over a request for the refill. Please call [facility] @ [phone].Marland KitchenHe tried to get the medication refill on his own but this is what they told him to do. Took his last pill on yesterday 07/09/2012.   Thanks

## 2012-07-10 NOTE — Telephone Encounter (Signed)
Called pharmacy this medication needs prior authorization.  Pharmacy will send PA form to call insurance

## 2012-07-12 NOTE — Telephone Encounter (Signed)
Darci Current said you told him to call you back today! Said he would be at home in about 10 minutes.

## 2012-07-13 NOTE — Telephone Encounter (Signed)
lmom 

## 2012-07-17 NOTE — Telephone Encounter (Signed)
Spoke with patient about the PA needed for Crestor.  He has tried Lipitor in the past.  I will proceed with Pa for Crestor

## 2012-07-22 ENCOUNTER — Ambulatory Visit: Payer: BC Managed Care – PPO

## 2012-07-22 ENCOUNTER — Ambulatory Visit (INDEPENDENT_AMBULATORY_CARE_PROVIDER_SITE_OTHER): Payer: BC Managed Care – PPO | Admitting: Family Medicine

## 2012-07-22 VITALS — BP 140/87 | HR 77 | Temp 98.0°F | Resp 18 | Wt 178.0 lb

## 2012-07-22 DIAGNOSIS — M79609 Pain in unspecified limb: Secondary | ICD-10-CM

## 2012-07-22 DIAGNOSIS — M25519 Pain in unspecified shoulder: Secondary | ICD-10-CM

## 2012-07-22 DIAGNOSIS — M79642 Pain in left hand: Secondary | ICD-10-CM

## 2012-07-22 DIAGNOSIS — M79641 Pain in right hand: Secondary | ICD-10-CM

## 2012-07-22 DIAGNOSIS — M19049 Primary osteoarthritis, unspecified hand: Secondary | ICD-10-CM

## 2012-07-22 DIAGNOSIS — M25512 Pain in left shoulder: Secondary | ICD-10-CM

## 2012-07-22 LAB — COMPREHENSIVE METABOLIC PANEL
ALT: 12 U/L (ref 0–53)
AST: 18 U/L (ref 0–37)
Albumin: 4.5 g/dL (ref 3.5–5.2)
Alkaline Phosphatase: 27 U/L — ABNORMAL LOW (ref 39–117)
BUN: 15 mg/dL (ref 6–23)
CO2: 26 mEq/L (ref 19–32)
Calcium: 9.2 mg/dL (ref 8.4–10.5)
Chloride: 102 mEq/L (ref 96–112)
Creat: 1 mg/dL (ref 0.50–1.35)
Glucose, Bld: 110 mg/dL — ABNORMAL HIGH (ref 70–99)
Potassium: 4.5 mEq/L (ref 3.5–5.3)
Sodium: 139 mEq/L (ref 135–145)
Total Bilirubin: 0.4 mg/dL (ref 0.3–1.2)
Total Protein: 6.5 g/dL (ref 6.0–8.3)

## 2012-07-22 LAB — POCT CBC
Granulocyte percent: 63.2 %G (ref 37–80)
HCT, POC: 38.3 % — AB (ref 43.5–53.7)
Hemoglobin: 11.8 g/dL — AB (ref 14.1–18.1)
Lymph, poc: 1.5 (ref 0.6–3.4)
MCH, POC: 29.2 pg (ref 27–31.2)
MCHC: 30.8 g/dL — AB (ref 31.8–35.4)
MCV: 94.9 fL (ref 80–97)
MID (cbc): 0.5 (ref 0–0.9)
MPV: 7.6 fL (ref 0–99.8)
POC Granulocyte: 3.3 (ref 2–6.9)
POC LYMPH PERCENT: 28.2 %L (ref 10–50)
POC MID %: 8.6 %M (ref 0–12)
Platelet Count, POC: 257 10*3/uL (ref 142–424)
RBC: 4.04 M/uL — AB (ref 4.69–6.13)
RDW, POC: 15 %
WBC: 5.3 10*3/uL (ref 4.6–10.2)

## 2012-07-22 LAB — RHEUMATOID FACTOR: Rhuematoid fact SerPl-aCnc: <10 IU/mL (ref <=14)

## 2012-07-22 LAB — POCT SEDIMENTATION RATE: POCT SED RATE: 11 mm/hr (ref 0–22)

## 2012-07-22 LAB — URIC ACID: Uric Acid, Serum: 6.9 mg/dL (ref 4.0–7.8)

## 2012-07-22 MED ORDER — MELOXICAM 15 MG PO TABS: 15.0000 mg | ORAL_TABLET | Freq: Every day | ORAL | Status: DC

## 2012-07-22 NOTE — Patient Instructions (Addendum)

## 2012-07-22 NOTE — Progress Notes (Signed)
[place], [place]  [address]   [phone]  Subjective:    Patient ID: Frederick Rush, male    DOB: [birth year], 62 y.o.   MRN: [id number]  HPI This 62 y.o. male presents for evaluation of the following:  1.  B hand OA:  Chronic OA thumbs; taking Naprosen sodium; B thumb pain.  Chronic use of hands.  Soaking hands in hot water with temporary relief.  Maintained on Mobic with persistent pain.  Has suffered with arthritis pain in past year.  No xrays of hands.  Base of thumb.  No n/t; no swelling.  L second digit also affected.  Horrible stiffness; after getting up, stiffness improves.  Must wear gloves at home.  No rhuematoid arthritis; daughter taking arthritis medication.    2.  L shoulder pain:just started hurting in shoulder.  No previous evaluation.  Deltoid pain radiating to back.  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  No n/t.   No injury.   Review of Systems  Constitutional: Negative for fever, chills, diaphoresis and fatigue.  Musculoskeletal: Positive for back pain and arthralgias. Negative for myalgias and joint swelling.  Neurological: Negative for weakness and numbness.    Past Medical History  Diagnosis Date  . Hyperlipidemia   . Arthritis   . Hypercholesterolemia   . Crohn disease   . Angina   . Pneumonia   . CAD (coronary artery disease), non obstructive by cath 07/19/11, treat medically 07/19/2011  . Myocardial infarction   . Cancer   . Skin cancer 2006    Left shoulder    Past Surgical History  Procedure Laterality Date  . Hernia repair    . Knee surgery      arthroscopy  . Tonsillectomy    . Appendectomy      Prior to Admission medications   Medication Sig Start Date End Date Taking? Authorizing Provider  mesalamine (APRISO) 0.375 G 24 hr capsule Take 1,500 mg by mouth every morning. 1557m=4 capsules   Yes Historical Provider, MD  metoprolol succinate (TOPROL XL) 25 MG 24 hr tablet Take half a 25 mg tablet for  12.5 mg daily. 07/09/12  Yes JLorretta Harp MD  nitroGLYCERIN (NITROSTAT) 0.4 MG SL tablet Place 1 tablet (0.4 mg total) under the tongue every 5 (five) minutes x 3 doses as needed for chest pain. 07/19/11 07/22/12 Yes LCecilie Kicks NP  rosuvastatin (CRESTOR) 20 MG tablet Take 1 tablet (20 mg total) by mouth every evening. 07/09/12  Yes JLorretta Harp MD  meloxicam (MOBIC) 15 MG tablet Take 1 tablet (15 mg total) by mouth daily. 07/20/11   HCollene Leyden PA-C    No Known Allergies  History   Social History  . Marital Status: Married    Spouse Name: N/A    Number of Children: N/A  . Years of Education: N/A   Occupational History  . Not on file.   Social History Main Topics  . Smoking status: Former Smoker    Quit date: 07/15/2001  . Smokeless tobacco: Never Used  . Alcohol Use: 7.2 oz/week    12 Cans of beer per week  . Drug Use: No  . Sexually Active: Yes    Birth Control/ Protection: None   Other Topics Concern  . Not on file   [place] for [employer]   Family History  Problem Relation Age of Onset  . Coronary  artery disease Mother 66    CABG       Objective:   Physical Exam  Nursing note and vitals reviewed. Constitutional: He appears well-developed and well-nourished. No distress.  Musculoskeletal:       Left shoulder: He exhibits normal range of motion, no tenderness, no bony tenderness, no swelling, no pain, no spasm, normal pulse and normal strength.       Cervical back: Normal.  Hands: arthritic changes B hands; decreased ROM B thumbs; no swelling. No erythema.  Skin: Skin is warm and dry. No rash noted. He is not diaphoretic.  Psychiatric: He has a normal mood and affect. His behavior is normal.    [facility] reading (PRIMARY) by  Dr. Tamala Julian.  L SHOULDER:  NAD.  HAND:  +DIFFUSE ARTHRITIC CHANGES.      Assessment & Plan:  Bilateral hand pain - Plan: DG Hand Complete Right, Rheumatoid factor, ANA, Uric acid, POCT CBC,  POCT SEDIMENTATION RATE  Pain in joint, shoulder region, left - Plan: DG Shoulder Left, Rheumatoid factor, ANA, Uric acid, POCT CBC, POCT SEDIMENTATION RATE  Inflammation of hand joint, unspecified laterality - Plan: Uric acid, POCT CBC, POCT SEDIMENTATION RATE, meloxicam (MOBIC) 15 MG tablet, Comprehensive metabolic panel  Osteoarthritis of hand, unspecified laterality   1.  B thumb/hand OA: New.  Rx for Mobic 75m daily.  Encour[place]  Discussed long term NSAID/COX-2 use with patient; obtain CMET.  Discussed risk versus benefits. 2.  Pain L Shoulder:  New.  Rx for Mobic; home exercises provided to perform daily. 3.  Strain L Shoulder:  New.  Home exercises provided; start Mobic.  Appointment with ortho provided. Meds ordered this encounter  Medications  . meloxicam (MOBIC) 15 MG tablet    Sig: Take 1 tablet (15 mg total) by mouth daily.    Dispense:  30 tablet    Refill:  11

## 2012-07-23 LAB — ANA: Anti Nuclear Antibody(ANA): NEGATIVE

## 2012-08-03 ENCOUNTER — Telehealth: Payer: Self-pay

## 2012-08-03 NOTE — Telephone Encounter (Signed)
Calling back about a message left today concerning his labs.

## 2012-08-04 NOTE — Telephone Encounter (Signed)
Tried to leave message, mailbox full.

## 2012-11-19 ENCOUNTER — Ambulatory Visit (INDEPENDENT_AMBULATORY_CARE_PROVIDER_SITE_OTHER): Payer: BC Managed Care – PPO | Admitting: Cardiovascular Disease

## 2012-11-19 ENCOUNTER — Encounter: Payer: Self-pay | Admitting: Cardiovascular Disease

## 2012-11-19 VITALS — BP 150/80 | HR 53 | Ht 68.5 in | Wt 183.8 lb

## 2012-11-19 DIAGNOSIS — Z79899 Other long term (current) drug therapy: Secondary | ICD-10-CM

## 2012-11-19 DIAGNOSIS — E785 Hyperlipidemia, unspecified: Secondary | ICD-10-CM

## 2012-11-19 DIAGNOSIS — I2510 Atherosclerotic heart disease of native coronary artery without angina pectoris: Secondary | ICD-10-CM

## 2012-11-19 MED ORDER — NITROGLYCERIN 0.4 MG SL SUBL: 0.4000 mg | SUBLINGUAL_TABLET | SUBLINGUAL | Status: DC | PRN

## 2012-11-19 MED ORDER — ROSUVASTATIN CALCIUM 20 MG PO TABS: 20.0000 mg | ORAL_TABLET | Freq: Every evening | ORAL | Status: DC

## 2012-11-19 NOTE — Assessment & Plan Note (Signed)
That is cardiac catheterization performed by Dr. Glenetta Hew 07/15/11 revealing noncritical CAD with normally functioning. Patient has been asymptomatic. Continued medical therapy will be recommended.

## 2012-11-19 NOTE — Patient Instructions (Signed)
Your physician wants you to follow-up in: 1 year with Dr Berry. You will receive a reminder letter in the mail two months in advance. If you don't receive a letter, please call our office to schedule the follow-up appointment.  

## 2012-11-19 NOTE — Assessment & Plan Note (Signed)
On statin therapy. We will recheck a lipid and liver profile 

## 2012-11-19 NOTE — Progress Notes (Signed)
11/19/2012 Frederick Rush   23-Nov-1950  [id number]  Primary Physician Reginia Forts, MD Primary Cardiologist: Lorretta Harp MD Renae Gloss   HPI:  The patient is a 62 year old mildly overweight, married, Caucasian male, father of two, grandfather to two grandchildren, who was seen here in our office two months ago by Dr. Tarri Fuller, PA-C. He has a history of noncritical CAD found on cath performed by Dr. Ula Lingo on Jul 15, 2011. His other problems include hyperlipidemia and remote tobacco abuse. He denies chest pain or shortness of breath.    Current Outpatient Prescriptions  Medication Sig Dispense Refill  . aspirin 81 MG tablet Take 81 mg by mouth daily.      . meloxicam (MOBIC) 15 MG tablet Take 1 tablet (15 mg total) by mouth daily.  30 tablet  11  . mesalamine (APRISO) 0.375 G 24 hr capsule Take 1,500 mg by mouth every morning. 1532m=4 capsules      . nitroGLYCERIN (NITROSTAT) 0.4 MG SL tablet Place 1 tablet (0.4 mg total) under the tongue every 5 (five) minutes x 3 doses as needed for chest pain.  25 tablet  4  . rosuvastatin (CRESTOR) 20 MG tablet Take 1 tablet (20 mg total) by mouth every evening.  30 tablet  5  . metoprolol succinate (TOPROL XL) 25 MG 24 hr tablet Take half a 25 mg tablet for 12.5 mg daily.  16 tablet  5   No current facility-administered medications for this visit.    No Known Allergies  History   Social History  . Marital Status: Married    Spouse Name: N/A    Number of Children: N/A  . Years of Education: N/A   Occupational History  . Not on file.   Social History Main Topics  . Smoking status: Former Smoker    Quit date: 07/16/1998  . Smokeless tobacco: Never Used  . Alcohol Use: 4.8 oz/week    8 Cans of beer per week  . Drug Use: No  . Sexual Activity: Yes    Birth Control/ Protection: None   Other Topics Concern  . Not on file   Social History Narrative   Drive a truck for [employer]    Review of  Systems: General: negative for chills, fever, night sweats or weight changes.  Cardiovascular: negative for chest pain, dyspnea on exertion, edema, orthopnea, palpitations, paroxysmal nocturnal dyspnea or shortness of breath Dermatological: negative for rash Respiratory: negative for cough or wheezing Urologic: negative for hematuria Abdominal: negative for nausea, vomiting, diarrhea, bright red blood per rectum, melena, or hematemesis Neurologic: negative for visual changes, syncope, or dizziness All other systems reviewed and are otherwise negative except as noted above.    Blood pressure 150/80, pulse 53, height 5' 8.5" (1.74 m), weight 183 lb 12.8 oz (83.371 kg).  General appearance: alert and no distress Neck: no adenopathy, no carotid bruit, no JVD, supple, symmetrical, trachea midline and thyroid not enlarged, symmetric, no tenderness/mass/nodules Lungs: clear to auscultation bilaterally Heart: regular rate and rhythm, S1, S2 normal, no murmur, click, rub or gallop Extremities: extremities normal, atraumatic, no cyanosis or edema  EKG sinus bradycardia at 53 without ST or T wave changes  ASSESSMENT AND PLAN:   Dyslipidemia On statin therapy. We will recheck a lipid and liver profile  CAD (coronary artery disease), non obstructive by cath 07/19/11, treat medically That is cardiac catheterization performed by Dr. DGlenetta Hew5/24/13 revealing noncritical CAD with normally functioning. Patient has been asymptomatic.  Continued medical therapy will be recommended.      Lorretta Harp MD FACP,FACC,FAHA, [facility] 11/19/2012 2:02 PM

## 2012-11-20 LAB — LIPID PANEL
Cholesterol: 186 mg/dL (ref 0–200)
HDL: 62 mg/dL (ref >39)
LDL Cholesterol: 104 mg/dL — ABNORMAL HIGH (ref 0–99)
Total CHOL/HDL Ratio: 3 Ratio
Triglycerides: 99 mg/dL (ref <150)
VLDL: 20 mg/dL (ref 0–40)

## 2012-11-20 LAB — HEPATIC FUNCTION PANEL
ALT: 18 U/L (ref 0–53)
AST: 20 U/L (ref 0–37)
Albumin: 4.4 g/dL (ref 3.5–5.2)
Alkaline Phosphatase: 23 U/L — ABNORMAL LOW (ref 39–117)
Bilirubin, Direct: 0.1 mg/dL (ref 0.0–0.3)
Indirect Bilirubin: 0.5 mg/dL (ref 0.0–0.9)
Total Bilirubin: 0.6 mg/dL (ref 0.3–1.2)
Total Protein: 6.4 g/dL (ref 6.0–8.3)

## 2012-11-26 ENCOUNTER — Other Ambulatory Visit: Payer: Self-pay | Admitting: *Deleted

## 2012-11-26 DIAGNOSIS — E785 Hyperlipidemia, unspecified: Secondary | ICD-10-CM

## 2012-11-26 DIAGNOSIS — Z79899 Other long term (current) drug therapy: Secondary | ICD-10-CM

## 2012-11-26 MED ORDER — ROSUVASTATIN CALCIUM 40 MG PO TABS: 40.0000 mg | ORAL_TABLET | Freq: Every day | ORAL | Status: DC

## 2012-11-26 NOTE — Progress Notes (Signed)
Quick Note:  Left message to increase his crestor to 61m. I will send a new prescription to him. Also recheck labs in 2 months. ______

## 2013-01-23 LAB — LIPID PANEL
Cholesterol: 139 mg/dL (ref 0–200)
HDL: 59 mg/dL (ref >39)
LDL Cholesterol: 61 mg/dL (ref 0–99)
Total CHOL/HDL Ratio: 2.4 Ratio
Triglycerides: 97 mg/dL (ref <150)
VLDL: 19 mg/dL (ref 0–40)

## 2013-01-23 LAB — HEPATIC FUNCTION PANEL
ALT: 21 U/L (ref 0–53)
AST: 22 U/L (ref 0–37)
Albumin: 4.4 g/dL (ref 3.5–5.2)
Alkaline Phosphatase: 29 U/L — ABNORMAL LOW (ref 39–117)
Bilirubin, Direct: 0.1 mg/dL (ref 0.0–0.3)
Indirect Bilirubin: 0.4 mg/dL (ref 0.0–0.9)
Total Bilirubin: 0.5 mg/dL (ref 0.3–1.2)
Total Protein: 7 g/dL (ref 6.0–8.3)

## 2013-02-08 ENCOUNTER — Encounter: Payer: Self-pay | Admitting: *Deleted

## 2013-02-21 HISTORY — PX: FRACTURE SURGERY: SHX138

## 2013-03-20 ENCOUNTER — Ambulatory Visit (INDEPENDENT_AMBULATORY_CARE_PROVIDER_SITE_OTHER): Payer: BC Managed Care – PPO | Admitting: Physician Assistant

## 2013-03-20 VITALS — BP 130/78 | HR 74 | Temp 98.0°F | Resp 16 | Ht 66.5 in | Wt 183.0 lb

## 2013-03-20 DIAGNOSIS — J329 Chronic sinusitis, unspecified: Secondary | ICD-10-CM

## 2013-03-20 DIAGNOSIS — R059 Cough, unspecified: Secondary | ICD-10-CM

## 2013-03-20 DIAGNOSIS — R05 Cough: Secondary | ICD-10-CM

## 2013-03-20 MED ORDER — CEFDINIR 300 MG PO CAPS: 600.0000 mg | ORAL_CAPSULE | Freq: Every day | ORAL | Status: DC

## 2013-03-20 MED ORDER — IPRATROPIUM BROMIDE 0.03 % NA SOLN: 2.0000 | Freq: Two times a day (BID) | NASAL | Status: DC

## 2013-03-20 MED ORDER — BENZONATATE 100 MG PO CAPS: 100.0000 mg | ORAL_CAPSULE | Freq: Three times a day (TID) | ORAL | Status: DC | PRN

## 2013-03-20 MED ORDER — GUAIFENESIN ER 1200 MG PO TB12: 1.0000 | ORAL_TABLET | Freq: Two times a day (BID) | ORAL | Status: DC | PRN

## 2013-03-20 NOTE — Patient Instructions (Signed)
Get plenty of rest and drink at least 64 ounces of water daily. 

## 2013-03-20 NOTE — Progress Notes (Signed)
   Subjective:    Patient ID: Frederick Rush, male    DOB: 10-Nov-1950, 63 y.o.   MRN: [id number]  PCP: Reginia Forts, MD  Chief Complaint  Patient presents with  . Cough    x 1 week   Medications, allergies, past medical history, surgical history, family history, social history and problem list reviewed and updated.  HPI  Complains of cough x 1 week.  Sometimes productive of small "green lumps" of mucous.  Also notes nasal and sinus congestion and pressure with pain in his upper teeth and behind his cheeks. No fever, chills, nausea, vomiting, diarrhea, unexplained myalgias/arthralgias or rash. Reports "cillins" don't work, and that a zpak is "just like water."  Review of Systems As above.    Objective:   Physical Exam Blood pressure 130/78, pulse 74, temperature 98 F (36.7 C), temperature source Oral, resp. rate 16, height 5' 6.5" (1.689 m), weight 183 lb (83.008 kg), SpO2 98.00%. Body mass index is 29.1 kg/(m^2). Well-developed, well nourished WM who is awake, alert and oriented, in NAD. HEENT: [place]/AT, PERRL, EOMI.  Sclera and conjunctiva are clear.  EAC are patent, TMs are normal in appearance. Nasal mucosa is congested, pink and moist. OP is clear. Maxillary sinuses are tender on palpation. Neck: supple, non-tender, no lymphadenopathy, thyromegaly. Heart: RRR, no murmur Lungs: normal effort, CTA Extremities: no cyanosis, clubbing or edema. Skin: warm and dry without rash. Psychologic: good mood and appropriate affect, normal speech and behavior.        Assessment & Plan:  1. Sinusitis Supportive care.  Rest. RTC if symptoms worsen/persist. - ipratropium (ATROVENT) 0.03 % nasal spray; Place 2 sprays into both nostrils 2 (two) times daily.  Dispense: 30 mL; Refill: 0 - cefdinir (OMNICEF) 300 MG capsule; Take 2 capsules (600 mg total) by mouth daily.  Dispense: 20 capsule; Refill: 0 - Guaifenesin (MUCINEX MAXIMUM STRENGTH) 1200 MG TB12; Take 1 tablet (1,200 mg total) by mouth  every 12 (twelve) hours as needed.  Dispense: 14 tablet; Refill: 1  2. Cough Due to post-nasal drainage. - benzonatate (TESSALON) 100 MG capsule; Take 1-2 capsules (100-200 mg total) by mouth 3 (three) times daily as needed for cough.  Dispense: 40 capsule; Refill: 0   Fara Chute, PA-C Physician Assistant-Certified [facility]

## 2013-03-25 NOTE — Progress Notes (Signed)
Patient will come in on Wednesday to see Dr Carlota Raspberry at the walk-in clinic.

## 2013-03-25 NOTE — Progress Notes (Signed)
Patient is coming to the walk-in center on Wednesday to see Dr Carlota Raspberry.  Patient is unsure why he is coming in to see Dr Carlota Raspberry.

## 2013-03-26 ENCOUNTER — Telehealth: Payer: Self-pay

## 2013-03-26 NOTE — Telephone Encounter (Signed)
Patient called stating he was told to return to clinic to see Dr. Carlota Raspberry but he doesn't know why.  Patient states he is doing better.  Please advise

## 2013-03-26 NOTE — Telephone Encounter (Signed)
If he is well, he does not need to return.  I believe that the message to do so was meant for another patient.

## 2013-03-27 NOTE — Telephone Encounter (Signed)
Spoke with pt, advised he didn't need to RTC. Pt understood.

## 2013-04-15 ENCOUNTER — Telehealth: Payer: Self-pay | Admitting: Cardiovascular Disease

## 2013-04-15 ENCOUNTER — Other Ambulatory Visit: Payer: Self-pay | Admitting: *Deleted

## 2013-04-15 MED ORDER — ROSUVASTATIN CALCIUM 40 MG PO TABS: 40.0000 mg | ORAL_TABLET | Freq: Every day | ORAL | Status: DC

## 2013-04-15 NOTE — Telephone Encounter (Signed)
Refills sent to pharmacy. 

## 2013-04-15 NOTE — Telephone Encounter (Signed)
He was told when he ran out of his Crestor samples to call. He said  you would call a prescription in for him. Please call this in to [facility]-[phone].Please call and let him know when you call this in.

## 2013-04-23 ENCOUNTER — Ambulatory Visit (INDEPENDENT_AMBULATORY_CARE_PROVIDER_SITE_OTHER): Payer: BC Managed Care – PPO | Admitting: Family Medicine

## 2013-04-23 VITALS — BP 128/72 | HR 70 | Temp 97.7°F | Resp 18 | Ht 66.5 in | Wt 180.0 lb

## 2013-04-23 DIAGNOSIS — S7012XA Contusion of left thigh, initial encounter: Secondary | ICD-10-CM

## 2013-04-23 DIAGNOSIS — M25519 Pain in unspecified shoulder: Secondary | ICD-10-CM

## 2013-04-23 DIAGNOSIS — M25562 Pain in left knee: Secondary | ICD-10-CM

## 2013-04-23 DIAGNOSIS — M79605 Pain in left leg: Secondary | ICD-10-CM

## 2013-04-23 DIAGNOSIS — M25569 Pain in unspecified knee: Secondary | ICD-10-CM

## 2013-04-23 DIAGNOSIS — M79609 Pain in unspecified limb: Secondary | ICD-10-CM

## 2013-04-23 NOTE — Progress Notes (Signed)
Subjective:    Patient ID: WILTON THRALL, male    DOB: [birth year], 63 y.o.   MRN: [id number]  HPI ALTIN SEASE is a 63 y.o. male  [place] at work last Wednesday morning (04/17/13)- loading a pallet of beer, R foot caught liftgate going into trailer.  Fell into trailer. Landed on metal dock board - L knee, thigh and side of L shoulder.  Reported injury to employer that day.  Told to see provider in [facility]. Unable to go to their office due to snow last week - was told had to go one of those 2 days. Did not see office that was recommended by employer. 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   Still having soreness on inside and back of L knee, bruising on outside of L thigh, but no hip pain. Still sore in shoulder - using it, but unable to elevate overhead.   Able to put weight on L leg, but sore with first standing - feels tense and tight on outside where bruised, but bruising on inside of leg noticed Sunday night. (3 days ago).    Patient Active Problem List   Diagnosis Date Noted  . CAD (coronary artery disease), non obstructive by cath 07/19/11, treat medically 07/19/2011  . Unstable angina 07/16/2011  . Dyslipidemia 07/16/2011  . History of smoking 07/16/2011  . Family history of coronary artery bypass surgery 07/16/2011  . Crohn disease 07/16/2011  . Peptic ulcer disease, endo 2011 07/16/2011   Past Medical History  Diagnosis Date  . Hyperlipidemia   . Arthritis   . Hypercholesterolemia   . Crohn disease   . Angina   . Pneumonia   . CAD (coronary artery disease), non obstructive by cath 07/19/11, treat medically 07/19/2011  . Myocardial infarction   . Cancer   . Skin cancer 2006    Left shoulder   Past Surgical History  Procedure Laterality Date  . Hernia repair    . Knee surgery      arthroscopy  . Tonsillectomy    . Appendectomy    . Cardiac catheterization  07/15/2011    Medical management   No Known Allergies Prior to Admission  medications   Medication Sig Start Date End Date Taking? Authorizing Provider  aspirin 81 MG tablet Take 81 mg by mouth daily.   Yes Historical Provider, MD  ipratropium (ATROVENT) 0.03 % nasal spray Place 2 sprays into both nostrils 2 (two) times daily. 03/20/13  Yes Chelle S Jeffery, PA-C  meloxicam (MOBIC) 15 MG tablet Take 1 tablet (15 mg total) by mouth daily. 07/22/12  Yes Wardell Honour, MD  mesalamine (APRISO) 0.375 G 24 hr capsule Take 1,500 mg by mouth every morning. 1576m=4 capsules   Yes Historical Provider, MD  nitroGLYCERIN (NITROSTAT) 0.4 MG SL tablet Place 1 tablet (0.4 mg total) under the tongue every 5 (five) minutes x 3 doses as needed for chest pain. 11/19/12  Yes JLorretta Harp MD  rosuvastatin (CRESTOR) 40 MG tablet Take 1 tablet (40 mg total) by mouth daily. 04/15/13  Yes JLorretta Harp MD  benzonatate (TESSALON) 100 MG capsule Take 1-2 capsules (100-200 mg total) by mouth 3 (three) times daily as needed for cough. 03/20/13   Chelle SJanalee Dane PA-C  cefdinir (OMNICEF) 300 MG capsule Take 2 capsules (600 mg total) by mouth daily. 03/20/13   Chelle S Jeffery, PA-C  Guaifenesin (MUCINEX MAXIMUM STRENGTH) 1200 MG TB12 Take 1 tablet (1,200 mg total)  by mouth every 12 (twelve) hours as needed. 03/20/13   Fara Chute, PA-C   History   Social History  . Marital Status: Married    Spouse Name: Shirlean Mylar    Number of Children: 2  . Years of Education: Associates   Occupational History  . BULK DRIVER    Social History Main Topics  . Smoking status: Former Smoker    Quit date: 07/16/1998  . Smokeless tobacco: Never Used  . Alcohol Use: 4.8 oz/week    8 Cans of beer per week  . Drug Use: No  . Sexual Activity: Yes    Partners: Female    Patent examiner Protection: None   Other Topics Concern  . 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  Lies with his wife and their pets.    Review of Systems  Musculoskeletal: Positive for  arthralgias.  Skin: Positive for color change.        Objective:   Physical Exam  Constitutional: He is oriented to person, place, and time. He appears well-developed and well-nourished.  Pulmonary/Chest: Effort normal.  Musculoskeletal:       Left shoulder: He exhibits decreased range of motion (90-100 abduction. ) and tenderness (lateral shoulder, no [place]/AC ttp. ).       Left knee: He exhibits swelling, effusion, ecchymosis and bony tenderness (medially. ).       Legs: Neurological: He is alert and oriented to person, place, and time.  Skin: Skin is warm and dry. No rash noted.  Psychiatric: He has a normal mood and affect. His behavior is normal.   Able to ambulate without pain with weightbearing - only sore on outside of hip in area of ecchymosis.    Filed Vitals:   04/23/13 1432  BP: 128/72  Pulse: 70  Temp: 97.7 F (36.5 C)  TempSrc: Oral  Resp: 18  Height: 5' 6.5" (1.689 m)  Weight: 180 lb (81.647 kg)  SpO2: 99%      Assessment & Plan:  YISROEL MULLENDORE is a 63 y.o. male Left knee pain  Left leg pain  Traumatic ecchymosis of left thigh  Pain in joint, shoulder region  S/p fall at work 6 days ago - L outer thigh contusion, medial knee contusion vs internal injury with development of knee effusion, and now with medial thigh ecchymosis.  Determined in history he was supposed to be seen at other medical office for evaluation last week, but due to snow on roads, did not feel comfortable going to that facility at that time. Discussed with HR contact at his employer today - who authorized him to be seen at this point at usual location for W/c eval- [facility].  As he is able to [facility] without difficulty, and has been back at work since last week with these injuries, as well as after discussion - determined he can drive there by private vehicle for eval today with possible xrays. I called and advised that facility of plan above. Map from here to their  location given to patient.

## 2013-05-22 ENCOUNTER — Ambulatory Visit: Payer: Self-pay | Admitting: Family Medicine

## 2013-05-22 VITALS — BP 140/80 | HR 57 | Temp 98.0°F | Resp 16 | Ht 66.5 in | Wt 176.0 lb

## 2013-05-22 DIAGNOSIS — K5090 Crohn's disease, unspecified, without complications: Secondary | ICD-10-CM

## 2013-05-22 DIAGNOSIS — I2510 Atherosclerotic heart disease of native coronary artery without angina pectoris: Secondary | ICD-10-CM

## 2013-05-22 DIAGNOSIS — Z0289 Encounter for other administrative examinations: Secondary | ICD-10-CM

## 2013-05-22 DIAGNOSIS — Z024 Encounter for examination for driving license: Secondary | ICD-10-CM

## 2013-05-22 NOTE — Patient Instructions (Signed)
One year DOT card  Return as needed

## 2013-05-22 NOTE — Progress Notes (Signed)
DOT exam  History: 63 year old man here for exam for DOT card. He is doing quite well. He drives locally.  Past medical history: Surgeries: Left inguinal hernia, tonsillectomy, appendectomy, and left knee cartilage removal Medical illnesses: No major illnesses. After an episode of chest pain and dizziness he had a heart catheterization which showed nonobstructive coronary disease.  Crohn's  Medications: See list Medication allergies: None  Social history: Patient is married. Works as a Administrator. Lives in [place].  Family history: Mother has had coronary bypass surgery and mastectomy.  Review of systems: Constitutional: Unremarkable HEENT: Wears glasses otherwise unremarkable Respiratory: Unremarkable Cardiovascular: Unremarkable. Sees cardiologist regularly. No symptoms. Gastrointestinal: History of early Crohn's disease but no symptoms Genitourinary: Unremarkable Dermatologic: Unremarkable Neurologic: Unremarkable Psychiatric: Unremarkable Endocrinologic: Unremarkable   Physical exam: Healthy-appearing man in no acute distress. TMs normal. Eyes PERRLA. Fundi benign. Throat clear. Neck supple without nodes or thyromegaly. No carotid bruits. Chest is clear to auscultation. Heart regular without murmurs gallops or arrhythmias. Abdomen soft without mass or tenderness. Normal male external genitalia with no hernias. Extremities unremarkable. Pedal pulses.  Assessment: Normal DOT examination History of nonobstructive coronary disease History of Crohn's disease, asymptomatic  Plan: Annual DOT

## 2013-06-10 ENCOUNTER — Ambulatory Visit (INDEPENDENT_AMBULATORY_CARE_PROVIDER_SITE_OTHER): Payer: BC Managed Care – PPO | Admitting: Family Medicine

## 2013-06-10 VITALS — BP 140/82 | HR 66 | Temp 97.4°F | Resp 18 | Ht 66.5 in | Wt 177.0 lb

## 2013-06-10 DIAGNOSIS — J029 Acute pharyngitis, unspecified: Secondary | ICD-10-CM

## 2013-06-10 DIAGNOSIS — R059 Cough, unspecified: Secondary | ICD-10-CM

## 2013-06-10 DIAGNOSIS — R05 Cough: Secondary | ICD-10-CM

## 2013-06-10 DIAGNOSIS — J329 Chronic sinusitis, unspecified: Secondary | ICD-10-CM

## 2013-06-10 MED ORDER — CEFDINIR 300 MG PO CAPS: 600.0000 mg | ORAL_CAPSULE | Freq: Every day | ORAL | Status: DC

## 2013-06-10 MED ORDER — HYDROCODONE-HOMATROPINE 5-1.5 MG/5ML PO SYRP: 5.0000 mL | ORAL_SOLUTION | Freq: Three times a day (TID) | ORAL | Status: DC | PRN

## 2013-06-10 NOTE — Progress Notes (Signed)
° °  Subjective:    Patient ID: Frederick Rush, male    DOB: 1950/04/17, 63 y.o.   MRN: [id number]  HPI This chart was scribed for Robyn Haber, MD by Ladene Artist, ED Scribe. The patient was seen in room 10. Patient's care was started at 1:34 PM.  HPI Comments: Frederick Rush is a 63 y.o. male who presents to the Emergency Department complaining of productive cough. He reports hoarseness last week that has resolved by using Flonase. Pt also reports associated postnasal drip and sore throat. He states that he feels like a "knot" is in the front of his throat. He reports wax build-up in his L ear that he was able to remove with the assistance of ear drops. He denies SOB and wheezing.   Pt drivers tractor trailers.    Review of Systems  HENT: Positive for ear discharge, postnasal drip and sore throat.   Respiratory: Positive for cough. Negative for shortness of breath and wheezing.        Objective:   Physical Exam No acute distress, patient articulate and could spirits HEENT: Moderately red pharynx, mucopurulent discharge in both nasal passages, normal TMs and canals, no acute distress Neck: Supple no adenopathy Chest: Occasional rhonchi bilaterally Heart: Regular no murmur Extremities: No edema Skin: Warm and dry, no rash or suspicious lesions       Assessment & Plan:  I personally performed the services described in this documentation, which was scribed in my presence. The recorded information has been reviewed and is accurate.  Sinusitis - Plan: cefdinir (OMNICEF) 300 MG capsule  Cough - Plan: cefdinir (OMNICEF) 300 MG capsule, HYDROcodone-homatropine (HYCODAN) 5-1.5 MG/5ML syrup  Acute pharyngitis - Plan: cefdinir (OMNICEF) 300 MG capsule  Signed, Robyn Haber, MD

## 2013-06-10 NOTE — Patient Instructions (Signed)

## 2013-06-16 IMAGING — CT CT HEART SCORING
1 series · 1 of 1 positions shown · IV contrast (agent unspecified)
Comparison: Chest radiograph 07/15/2011

INDICATION: 60-year-old with chest pain and diaphoresis.  The
patient was scheduled for a coronary CTA examination and coronary
artery calcium score.

CT CORONARY ARTERY CALCIUM SCORE
CONTRAST:  None
TECHNIQUE: A scout and noncontrast exam (for calcium scoring) were
performed.

[Series 301: display · 1.2mm · 0.46mm/px · 1 of 1 slices shown]
[im 1/1]
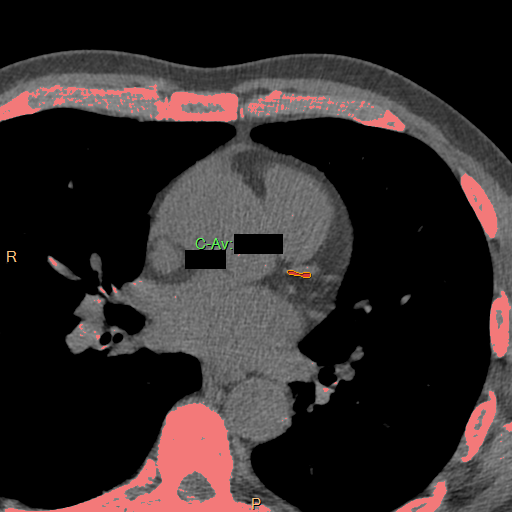

[1 of 1 positions shown; findings below may reference images not displayed]

CORONARY CALCIUM:

LAD:  337.29; RCA:

Total Agatston Score:  1108.8
[HOSPITAL] percentile:  95%

EXTRACARDIAC FINDINGS:
There are extensive coronary calcifications involving the LAD and
RCA.  Ascending thoracic aorta is prominent measuring up to 4.4 cm.
Images of the upper abdomen are unremarkable.  There are a few
small lymph nodes along the right cardiophrenic fat.  No
significant pericardial fluid.  There is a 4 mm nodule in the right
lower lobe on sequence 4, image 9.  Question another punctate
nodular density in the left lung base on image 31.
IMPRESSION: The patient's total coronary artery calcium score is 0030.8, which
is 95 percentile for patient's matched age and gender.  Due to the
elevated coronary artery calcium score, a CCTA exam was not
performed.

Enlarged ascending thoracic aorta measuring up to 4.4 cm.

There are at least two small nodules in the lungs.  Largest roughly
measures 4 mm. If the patient is at high risk for bronchogenic
carcinoma, follow-up chest CT at 1 year is recommended.  If the
patient is at low risk, no follow-up is needed.  This
recommendation follows the consensus statement: Guidelines for
Management of Small Pulmonary Nodules Detected on CT Scans:  A
Statement from the [HOSPITAL] as published in Radiology

## 2013-07-23 DIAGNOSIS — M359 Systemic involvement of connective tissue, unspecified: Secondary | ICD-10-CM | POA: Insufficient documentation

## 2013-11-20 ENCOUNTER — Encounter: Payer: Self-pay | Admitting: Cardiovascular Disease

## 2013-11-20 ENCOUNTER — Ambulatory Visit (INDEPENDENT_AMBULATORY_CARE_PROVIDER_SITE_OTHER): Payer: BC Managed Care – PPO | Admitting: Cardiovascular Disease

## 2013-11-20 VITALS — BP 142/92 | HR 80 | Ht 68.5 in | Wt 182.5 lb

## 2013-11-20 DIAGNOSIS — Z79899 Other long term (current) drug therapy: Secondary | ICD-10-CM

## 2013-11-20 DIAGNOSIS — E785 Hyperlipidemia, unspecified: Secondary | ICD-10-CM

## 2013-11-20 DIAGNOSIS — E782 Mixed hyperlipidemia: Secondary | ICD-10-CM

## 2013-11-20 DIAGNOSIS — I2510 Atherosclerotic heart disease of native coronary artery without angina pectoris: Secondary | ICD-10-CM

## 2013-11-20 MED ORDER — ROSUVASTATIN CALCIUM 40 MG PO TABS: 40.0000 mg | ORAL_TABLET | Freq: Every day | ORAL | Status: DC

## 2013-11-20 NOTE — Patient Instructions (Signed)
  We will see you back in follow up in 1 year with Dr Gwenlyn Found.   Dr Gwenlyn Found has ordered: Your physician recommends that you return for a FASTING lipid profile

## 2013-11-20 NOTE — Assessment & Plan Note (Signed)
noncritical CAD by cath performed 07/15/11. The patient denies chest pain or shortness of breath

## 2013-11-20 NOTE — Assessment & Plan Note (Signed)
On Crestor 40 mg. His last lipid profile performed 01/23/13 revealed a total cholesterol of 139, LDL 61 and HDL of 59.

## 2013-11-20 NOTE — Progress Notes (Signed)
11/20/2013 Frederick Rush   1950/05/19  [id number]  Primary Physician Reginia Forts, MD Primary Cardiologist: Lorretta Harp MD Renae Gloss   HPI:  The patient is a 63 year old mildly overweight, married, Caucasian male, father of two, grandfather to two grandchildren, who I last saw in the office one year ago.Frederick Rush He has a history of noncritical CAD found on cath performed by Dr. Ula Lingo on Jul 15, 2011. His other problems include hyperlipidemia and remote tobacco abuse. He denies chest pain or shortness of breath.    Current Outpatient Prescriptions  Medication Sig Dispense Refill  . aspirin 81 MG tablet Take 81 mg by mouth daily.      . fluticasone (FLONASE) 50 MCG/ACT nasal spray Place into both nostrils daily.      Frederick Rush gabapentin (NEURONTIN) 100 MG capsule Take 100 mg by mouth 3 (three) times daily.      . meloxicam (MOBIC) 15 MG tablet Take 1 tablet (15 mg total) by mouth daily.  30 tablet  11  . mesalamine (APRISO) 0.375 G 24 hr capsule Take 1,500 mg by mouth every morning. 1542m=4 capsules      . nitroGLYCERIN (NITROSTAT) 0.4 MG SL tablet Place 1 tablet (0.4 mg total) under the tongue every 5 (five) minutes x 3 doses as needed for chest pain.  25 tablet  4  . rosuvastatin (CRESTOR) 40 MG tablet Take 1 tablet (40 mg total) by mouth daily.  30 tablet  6   No current facility-administered medications for this visit.    No Known Allergies  History   Social History  . Marital Status: Married    Spouse Name: Frederick Rush   Number of Children: 2  . Years of Education: Associates   Occupational History  . BULK DRIVER    Social History Main Topics  . Smoking status: Former Smoker    Quit date: 07/16/1998  . Smokeless tobacco: Never Used  . Alcohol Use: 4.8 oz/week    8 Cans of beer per week  . Drug Use: No  . Sexual Activity: Yes    Partners: Female    BPatent examinerProtection: None   Other Topics Concern  . 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     Review of Systems: General: negative for chills, fever, night sweats or weight changes.  Cardiovascular: negative for chest pain, dyspnea on exertion, edema, orthopnea, palpitations, paroxysmal nocturnal dyspnea or shortness of breath Dermatological: negative for rash Respiratory: negative for cough or wheezing Urologic: negative for hematuria Abdominal: negative for nausea, vomiting, diarrhea, bright red blood per rectum, melena, or hematemesis Neurologic: negative for visual changes, syncope, or dizziness All other systems reviewed and are otherwise negative except as noted above.    Blood pressure 142/92, pulse 80, height 5' 8.5" (1.74 m), weight 182 lb 8 oz (82.781 kg).  General appearance: alert and no distress Neck: no adenopathy, no carotid bruit, no JVD, supple, symmetrical, trachea midline and thyroid not enlarged, symmetric, no tenderness/mass/nodules Lungs: clear to auscultation bilaterally Heart: regular rate and rhythm, S1, S2 normal, no murmur, click, rub or gallop Extremities: extremities normal, atraumatic, no cyanosis or edema  EKG normal sinus rhythm at 80 without ST or T wave changes  ASSESSMENT AND PLAN:   Dyslipidemia On Crestor 40 mg. His last lipid profile performed 01/23/13 revealed a total cholesterol of 139, LDL 61 and HDL of 59.  CAD (coronary artery disease), non obstructive by cath 07/19/11, treat medically noncritical CAD by cath performed 07/15/11. The patient denies chest pain or shortness of breath      Lorretta Harp MD [facility], [facility] 11/20/2013 3:10 PM

## 2013-11-21 LAB — HEPATIC FUNCTION PANEL
ALT: 20 U/L (ref 0–53)
AST: 23 U/L (ref 0–37)
Albumin: 4.6 g/dL (ref 3.5–5.2)
Alkaline Phosphatase: 35 U/L — ABNORMAL LOW (ref 39–117)
Bilirubin, Direct: 0.1 mg/dL (ref 0.0–0.3)
Indirect Bilirubin: 0.6 mg/dL (ref 0.2–1.2)
Total Bilirubin: 0.7 mg/dL (ref 0.2–1.2)
Total Protein: 7 g/dL (ref 6.0–8.3)

## 2013-11-21 LAB — LIPID PANEL
Cholesterol: 165 mg/dL (ref 0–200)
HDL: 61 mg/dL (ref >39)
LDL Cholesterol: 60 mg/dL (ref 0–99)
Total CHOL/HDL Ratio: 2.7 Ratio
Triglycerides: 218 mg/dL — ABNORMAL HIGH (ref <150)
VLDL: 44 mg/dL — ABNORMAL HIGH (ref 0–40)

## 2013-12-12 ENCOUNTER — Telehealth: Payer: Self-pay | Admitting: Cardiovascular Disease

## 2013-12-12 NOTE — Telephone Encounter (Signed)
Pt called in wanting to know what his blood pressure was when he was in to see Dr. Gwenlyn Found on 9/30. Please call  Thanks

## 2013-12-12 NOTE — Telephone Encounter (Signed)
Provided patient with BP from 11/20/13 appointment.   Reports his BP at a workplace health fair(?) was 169/90, 159/93, 159/90 Patient reports a lot of stress with his work trying to get him to come back to work and his son is stressing him out.  He feels fine and just wanted to report his BP readings.   Will forward to Dr. Cecil Cobbs, RN as Juluis Rainier

## 2013-12-13 NOTE — Telephone Encounter (Signed)
Make an appointment to see [facility] for blood pressure followup

## 2013-12-13 NOTE — Telephone Encounter (Signed)
I spoke with patient.  He is recovering from elbow surgery and went up to the office for the health fair.  He is concerned that bp is elevated, but contributes it to stress.  Patient will either get a home blood pressure monitor or come here for a blood pressure check with Erasmo Downer.  He will contact us.

## 2014-01-30 ENCOUNTER — Encounter (HOSPITAL_COMMUNITY): Payer: Self-pay | Admitting: Cardiovascular Disease

## 2014-04-16 ENCOUNTER — Telehealth: Payer: Self-pay | Admitting: Cardiovascular Disease

## 2014-04-16 NOTE — Telephone Encounter (Signed)
Mr. Berka is calling because his heart rate is at 159 and is calling to speak to someone about it . Please Call    Thanks

## 2014-04-16 NOTE — Telephone Encounter (Signed)
Spoke w/ patient. He reports BPs have been up (systolic 017, 793, 903) and he was advised that he would need to have systolic below 009 to pass DoT physical.  Reviewed chart, he is not on BP controlling med and had been advised for visit w/ Erasmo Downer per Dr. Gwenlyn Found in Oct 2015.  He is checking BP at home routinely. No symptoms reported.   I spoke w/ Erasmo Downer, arranged BP visit for this patient so that he can be checked in office and be started on appropriate meds. He voiced understanding & appreciation.

## 2014-04-23 ENCOUNTER — Ambulatory Visit (INDEPENDENT_AMBULATORY_CARE_PROVIDER_SITE_OTHER): Payer: BLUE CROSS/BLUE SHIELD | Admitting: Pharmacist Clinician (PhC)/ Clinical Pharmacy Specialist

## 2014-04-23 VITALS — BP 162/88 | HR 58 | Wt 184.0 lb

## 2014-04-23 DIAGNOSIS — I10 Essential (primary) hypertension: Secondary | ICD-10-CM

## 2014-04-23 MED ORDER — LISINOPRIL 10 MG PO TABS: 10.0000 mg | ORAL_TABLET | Freq: Every day | ORAL | Status: DC

## 2014-04-23 NOTE — Patient Instructions (Signed)
Return for a a follow up appointment in 3 week    Your blood pressure today is 162/88  Check your blood pressure at home daily and keep record of the readings.  Take your BP meds as follows: start lisinopril 10 mg once daily  Bring all of your meds, your BP cuff and your record of home blood pressures to your next appointment.  Exercise as you're able, try to walk approximately 30 minutes per day.  Keep salt intake to a minimum, especially watch canned and prepared boxed foods.  Eat more fresh fruits and vegetables and fewer canned items.  Avoid eating in fast food restaurants.    HOW TO TAKE YOUR BLOOD PRESSURE: . Rest 5 minutes before taking your blood pressure. .  Don't smoke or drink caffeinated beverages for at least 30 minutes before. . Take your blood pressure before (not after) you eat. . Sit comfortably with your back supported and both feet on the floor (don't cross your legs). . Elevate your arm to heart level on a table or a desk. . Use the proper sized cuff. It should fit smoothly and snugly around your bare upper arm. There should be enough room to slip a fingertip under the cuff. The bottom edge of the cuff should be 1 inch above the crease of the elbow. . Ideally, take 3 measurements at one sitting and record the average.

## 2014-04-24 ENCOUNTER — Encounter: Payer: Self-pay | Admitting: Pharmacist Clinician (PhC)/ Clinical Pharmacy Specialist

## 2014-04-24 DIAGNOSIS — I10 Essential (primary) hypertension: Secondary | ICD-10-CM | POA: Insufficient documentation

## 2014-04-24 NOTE — Assessment & Plan Note (Signed)
Pt with no previous history of hypertension, now unable to pass DOT physical because of elevated readings.  Will start him on lisinopril 10 mg once daily.  Advised him to continue with home BP checks and return in 3 weeks for re-evaluation.  Encouraged patient to stay active, monitor salt intake closely.  He is take daily home BP readings and bring cuff with him for calibration in 3 weeks

## 2014-04-24 NOTE — Progress Notes (Signed)
     04/24/2014 SPYRIDON HORNSTEIN 11-Mar-1950 [id number]   HPI:  Frederick Rush is a 64 y.o. male patient of Dr Gwenlyn Found, with a PMH below who presents today for hypertension clinic evaluation.  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  Dinner is meat (varies) and green vegetables.  Does not add salt while cooking, but some at table  Exercise: none other than walking, moving cases of beer, most of day on job  Home BP readings: as high as 174/110 on new cuff daughter bought for him (did not bring into office today)  Current antihypertensive medications: none   Current Outpatient Prescriptions  Medication Sig Dispense Refill  . aspirin 81 MG tablet Take 81 mg by mouth daily.    . fluticasone (FLONASE) 50 MCG/ACT nasal spray Place into both nostrils daily.    Marland Kitchen gabapentin (NEURONTIN) 100 MG capsule Take 100 mg by mouth at bedtime.     Marland Kitchen lisinopril (PRINIVIL,ZESTRIL) 10 MG tablet Take 1 tablet (10 mg total) by mouth daily. 30 tablet 3  . meloxicam (MOBIC) 15 MG tablet Take 1 tablet (15 mg total) by mouth daily. 30 tablet 11  . mesalamine (APRISO) 0.375 G 24 hr capsule Take 1,500 mg by mouth every morning. 1559m=4 capsules    . nitroGLYCERIN (NITROSTAT) 0.4 MG SL tablet Place 1 tablet (0.4 mg total) under the tongue every 5 (five) minutes x 3 doses as needed for chest pain. 25 tablet 4  . rosuvastatin (CRESTOR) 40 MG tablet Take 1 tablet (40 mg total) by mouth daily. 30 tablet 6   No current facility-administered medications for this visit.    No Known Allergies  Past Medical History  Diagnosis Date  . Hyperlipidemia   . Arthritis   . Hypercholesterolemia   .  Crohn disease   . Angina   . Pneumonia   . CAD (coronary artery disease), non obstructive by cath 07/19/11, treat medically 07/19/2011  . Myocardial infarction   . Cancer   . Skin cancer 2006    Left shoulder    Blood pressure 162/88, pulse 58, weight 184 lb (83.462 kg).    KTommy MedalPharmD CPP [facility]

## 2014-05-12 ENCOUNTER — Ambulatory Visit (INDEPENDENT_AMBULATORY_CARE_PROVIDER_SITE_OTHER): Payer: BLUE CROSS/BLUE SHIELD | Admitting: Pharmacist Clinician (PhC)/ Clinical Pharmacy Specialist

## 2014-05-12 VITALS — BP 160/84 | HR 66 | Ht 66.5 in | Wt 182.4 lb

## 2014-05-12 DIAGNOSIS — I10 Essential (primary) hypertension: Secondary | ICD-10-CM

## 2014-05-12 NOTE — Patient Instructions (Addendum)
Return for a a follow up appointment in 6 weeks  Your blood pressure today is 160/84  (goal for CDL is <140/90)  Check your blood pressure at home daily (if able) and keep record of the readings.  Take your BP meds as follows: increase lisinopril to 20 mg daily.  Get lab work done next week   Bring all of your meds, your BP cuff and your record of home blood pressures to your next appointment.  Exercise as you're able, try to walk approximately 30 minutes per day.  Keep salt intake to a minimum, especially watch canned and prepared boxed foods.  Eat more fresh fruits and vegetables and fewer canned items.  Avoid eating in fast food restaurants.    HOW TO TAKE YOUR BLOOD PRESSURE: . Rest 5 minutes before taking your blood pressure. .  Don't smoke or drink caffeinated beverages for at least 30 minutes before. . Take your blood pressure before (not after) you eat. . Sit comfortably with your back supported and both feet on the floor (don't cross your legs). . Elevate your arm to heart level on a table or a desk. . Use the proper sized cuff. It should fit smoothly and snugly around your bare upper arm. There should be enough room to slip a fingertip under the cuff. The bottom edge of the cuff should be 1 inch above the crease of the elbow. . Ideally, take 3 measurements at one sitting and record the average.

## 2014-05-13 ENCOUNTER — Encounter: Payer: Self-pay | Admitting: Pharmacist Clinician (PhC)/ Clinical Pharmacy Specialist

## 2014-05-13 NOTE — Progress Notes (Signed)
     05/13/2014 Frederick Rush Jul 16, 1950 [id number]   HPI:  Frederick Rush is a 64 y.o. male patient of Dr Gwenlyn Found, with a PMH below who presents today for a follow up hypertension clinic visit.  We saw him last month and started him on lisinopril 10 mg.  He initially had some muscle cramping when he started, but it resolved and he has had no further complaints or problems.  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  Dinner is meat (varies) and green vegetables.  Does not add salt while cooking, but some at table  Exercise: none other than walking, moving cases of beer, most of day on job  Home BP readings: home readings have been mostly in the 939-030S systolic and 92Z diastolic.  Checked his cuff in the office today and it read within 5 points.    Current antihypertensive medications: none   Current Outpatient Prescriptions  Medication Sig Dispense Refill  . aspirin 81 MG tablet Take 81 mg by mouth daily.    . fluticasone (FLONASE) 50 MCG/ACT nasal spray Place into both nostrils daily.    Marland Kitchen gabapentin (NEURONTIN) 100 MG capsule Take 100 mg by mouth at bedtime.     Marland Kitchen lisinopril (PRINIVIL,ZESTRIL) 10 MG tablet Take 1 tablet (10 mg total) by mouth daily. 30 tablet 3  . meloxicam (MOBIC) 15 MG tablet Take 1 tablet (15 mg total) by mouth daily. 30 tablet 11  . mesalamine (APRISO) 0.375 G 24 hr capsule Take 1,500 mg by mouth every morning. 151m=4 capsules    . nitroGLYCERIN (NITROSTAT) 0.4 MG SL tablet Place 1 tablet (0.4 mg total) under the tongue every 5 (five) minutes x 3 doses as needed for chest pain. 25 tablet 4  . rosuvastatin (CRESTOR) 40 MG tablet Take 1 tablet (40 mg total) by mouth  daily. 30 tablet 6   No current facility-administered medications for this visit.    No Known Allergies  Past Medical History  Diagnosis Date  . Hyperlipidemia   . Arthritis   . Hypercholesterolemia   . Crohn disease   . Angina   . Pneumonia   . CAD (coronary artery disease), non obstructive by cath 07/19/11, treat medically 07/19/2011  . Myocardial infarction   . Cancer   . Skin cancer 2006    Left shoulder    Blood pressure 160/84, pulse 66, height 5' 6.5" (1.689 m), weight 182 lb 6.4 oz (82.736 kg).    KTommy MedalPharmD CPP [facility]

## 2014-05-13 NOTE — Assessment & Plan Note (Addendum)
Although most of his home readings have been WNL, he is still concerned about the elevated reading because of his class C license.  We will increase his lisinopril to 20 mg daily and have him repeat a BMET in about 1 week.  He is to continue with home BP checks several times each week and I will see him again in 6 weeks for follow up.

## 2014-06-23 ENCOUNTER — Ambulatory Visit: Payer: BLUE CROSS/BLUE SHIELD | Admitting: Pharmacist Clinician (PhC)/ Clinical Pharmacy Specialist

## 2014-06-25 ENCOUNTER — Encounter: Payer: Self-pay | Admitting: Pharmacist Clinician (PhC)/ Clinical Pharmacy Specialist

## 2014-06-25 ENCOUNTER — Ambulatory Visit (INDEPENDENT_AMBULATORY_CARE_PROVIDER_SITE_OTHER): Payer: BLUE CROSS/BLUE SHIELD | Admitting: Pharmacist Clinician (PhC)/ Clinical Pharmacy Specialist

## 2014-06-25 VITALS — BP 130/84 | HR 76 | Ht 66.5 in | Wt 176.1 lb

## 2014-06-25 DIAGNOSIS — I10 Essential (primary) hypertension: Secondary | ICD-10-CM | POA: Diagnosis not present

## 2014-06-25 MED ORDER — LISINOPRIL 20 MG PO TABS: 20.0000 mg | ORAL_TABLET | Freq: Every day | ORAL | Status: DC

## 2014-06-25 MED ORDER — ROSUVASTATIN CALCIUM 40 MG PO TABS: 40.0000 mg | ORAL_TABLET | Freq: Every day | ORAL | Status: DC

## 2014-06-25 NOTE — Patient Instructions (Signed)
Your blood pressure today is 130/84 (goal is <140/90)  Check your blood pressure at home several times each week and keep record of the readings.  Take your BP meds as follows: continue with lisinopril 20 mg daily  Exercise as you're able, try to walk approximately 30 minutes per day.  Keep salt intake to a minimum, especially watch canned and prepared boxed foods.  Eat more fresh fruits and vegetables and fewer canned items.  Avoid eating in fast food restaurants.    HOW TO TAKE YOUR BLOOD PRESSURE: . Rest 5 minutes before taking your blood pressure. .  Don't smoke or drink caffeinated beverages for at least 30 minutes before. . Take your blood pressure before (not after) you eat. . Sit comfortably with your back supported and both feet on the floor (don't cross your legs). . Elevate your arm to heart level on a table or a desk. . Use the proper sized cuff. It should fit smoothly and snugly around your bare upper arm. There should be enough room to slip a fingertip under the cuff. The bottom edge of the cuff should be 1 inch above the crease of the elbow. . Ideally, take 3 measurements at one sitting and record the average.

## 2014-06-27 ENCOUNTER — Encounter: Payer: Self-pay | Admitting: Pharmacist Clinician (PhC)/ Clinical Pharmacy Specialist

## 2014-06-27 NOTE — Progress Notes (Signed)
     06/27/2014 Frederick Rush 1951/02/13 [id number]   HPI:  Frederick Rush is a 64 y.o. male patient of Dr Frederick Rush, with a PMH below who presents today for a follow up hypertension clinic visit.  We saw him 2 months ago originally and started him on lisinopril 10 mg, increasing to 20 mg at last visit.  He initially had some muscle cramping when he started, but it resolved and he has had no further complaints or problems.  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  Dinner is meat (varies) and green vegetables.  Does not add salt while cooking, but some at table  Exercise: none other than walking, moving cases of beer, most of day on job  Home BP readings: home readings have been mostly in the 510C systolic and 58N diastolic.  Only one reading showed systolic >277.  His meter was checked in the office at his last visit and it read within 5 points.    Current antihypertensive medications: lisinopril 20 mg   Current Outpatient Prescriptions  Medication Sig Dispense Refill  . aspirin 81 MG tablet Take 81 mg by mouth daily.    . fluticasone (FLONASE) 50 MCG/ACT nasal spray Place into both nostrils daily.    Marland Kitchen gabapentin (NEURONTIN) 100 MG capsule Take 100 mg by mouth at bedtime.     Marland Kitchen lisinopril (PRINIVIL,ZESTRIL) 20 MG tablet Take 1 tablet (20 mg total) by mouth daily. 90 tablet 3  . meloxicam (MOBIC) 15 MG tablet Take 1 tablet (15 mg total) by mouth daily. 30 tablet 11  . mesalamine (APRISO) 0.375 G 24 hr capsule Take 1,500 mg by mouth every morning. 1551m=4 capsules    . nitroGLYCERIN (NITROSTAT) 0.4 MG SL tablet Place 1 tablet (0.4 mg total) under the tongue every 5 (five) minutes x 3 doses as  needed for chest pain. 25 tablet 4  . rosuvastatin (CRESTOR) 40 MG tablet Take 1 tablet (40 mg total) by mouth daily. 30 tablet 6   No current facility-administered medications for this visit.    No Known Allergies  Past Medical History  Diagnosis Date  . Hyperlipidemia   . Arthritis   . Hypercholesterolemia   . Crohn disease   . Angina   . Pneumonia   . CAD (coronary artery disease), non obstructive by cath 07/19/11, treat medically 07/19/2011  . Myocardial infarction   . Cancer   . Skin cancer 2006    Left shoulder    Blood pressure 130/84, pulse 76, height 5' 6.5" (1.689 m), weight 176 lb 1.6 oz (79.878 kg).    KTommy MedalPharmD CPP [facility]

## 2014-06-28 NOTE — Assessment & Plan Note (Signed)
Today his BP is well controlled at 130/84.  His home readings have also been WNL.  For now we will continue with the lisinopril 20 mg daily.  He is to continue monitoring his BP at home, several times each week, and notify us should the readings trend higher than 678 systolic again.

## 2014-07-01 ENCOUNTER — Other Ambulatory Visit: Payer: Self-pay | Admitting: Cardiovascular Disease

## 2014-07-02 NOTE — Telephone Encounter (Signed)
Rx(s) sent to pharmacy electronically.  

## 2014-10-04 ENCOUNTER — Ambulatory Visit (INDEPENDENT_AMBULATORY_CARE_PROVIDER_SITE_OTHER): Payer: BLUE CROSS/BLUE SHIELD | Admitting: Family Medicine

## 2014-10-04 VITALS — BP 130/70 | HR 54 | Temp 97.7°F | Ht 68.0 in | Wt 175.4 lb

## 2014-10-04 DIAGNOSIS — K279 Peptic ulcer, site unspecified, unspecified as acute or chronic, without hemorrhage or perforation: Secondary | ICD-10-CM

## 2014-10-04 DIAGNOSIS — K5090 Crohn's disease, unspecified, without complications: Secondary | ICD-10-CM

## 2014-10-04 DIAGNOSIS — M19049 Primary osteoarthritis, unspecified hand: Secondary | ICD-10-CM

## 2014-10-04 DIAGNOSIS — Z23 Encounter for immunization: Secondary | ICD-10-CM | POA: Diagnosis not present

## 2014-10-04 DIAGNOSIS — G5621 Lesion of ulnar nerve, right upper limb: Secondary | ICD-10-CM

## 2014-10-04 DIAGNOSIS — M13149 Monoarthritis, not elsewhere classified, unspecified hand: Secondary | ICD-10-CM | POA: Diagnosis not present

## 2014-10-04 DIAGNOSIS — I2510 Atherosclerotic heart disease of native coronary artery without angina pectoris: Secondary | ICD-10-CM | POA: Diagnosis not present

## 2014-10-04 DIAGNOSIS — M19041 Primary osteoarthritis, right hand: Secondary | ICD-10-CM | POA: Diagnosis not present

## 2014-10-04 DIAGNOSIS — M19042 Primary osteoarthritis, left hand: Secondary | ICD-10-CM | POA: Diagnosis not present

## 2014-10-04 DIAGNOSIS — E785 Hyperlipidemia, unspecified: Secondary | ICD-10-CM

## 2014-10-04 MED ORDER — MELOXICAM 15 MG PO TABS: 15.0000 mg | ORAL_TABLET | Freq: Every day | ORAL | Status: DC

## 2014-10-04 MED ORDER — ZOSTER VACCINE LIVE 19400 UNT/0.65ML ~~LOC~~ SOLR: 0.6500 mL | Freq: Once | SUBCUTANEOUS | Status: DC

## 2014-10-04 NOTE — Patient Instructions (Signed)

## 2014-10-04 NOTE — Progress Notes (Signed)
Subjective:    Patient ID: Frederick Rush, male    DOB: [birth year], 64 y.o.   MRN: [id number] This chart was scribed for Frederick Forts, MD by Marti Sleigh, Medical Scribe. This patient was seen in Room 4 and the patient's care was started at 9:27 AM.  Chief Complaint  Patient presents with  . Medication Refill    Patient is requesting Meloxicam    HPI HPI Comments: Frederick Rush is a 64 y.o. male who presents to [facility] reporting for a medication refill for Meloxicam. He states his rheumatologist who normally writes his meloxicam prescription has moved practices, and so he needs a new doctor to write the prescription. The pt had a recent fall at work that crushed his ulnar nerve R. He had surgery performed by Dr. Baron Hamper to repair it; he also underwent consultation by neurology. He has constant improving numbness and weakness in his right hand due to the injury.   Pt's Crohn's disease is well controlled.  He is followed by GI every six months.   Pt takes one meloxicam per day. Pt had a colonoscopy performed by Dr. Fonda Kinder four years ago, one normal polyp found. They did an endoscopy that found a bleeding ulcer. He was also diagnosed with Crohns disease at that time.   Pt has had shingles, and would like a shingles vaccine. Pt has had regular prostate checks with DOT physical. Pt is not sure if he has had a PSA.  No previous Prevnar 13 vaccine in the past.  CAD: stable; followed by Dr. Gwenlyn Found every six months.  Denies CP/palp/SOB/leg swelling.  Review of Systems  Constitutional: Negative for fever, chills, diaphoresis, activity change, appetite change and fatigue.  Respiratory: Negative for cough and shortness of breath.   Cardiovascular: Negative for chest pain, palpitations and leg swelling.  Gastrointestinal: Negative for nausea, vomiting, abdominal pain, diarrhea and constipation.  Endocrine: Negative for cold intolerance, heat intolerance, polydipsia, polyphagia and polyuria.    Musculoskeletal: Positive for joint swelling and arthralgias.  Skin: Negative for color change, rash and wound.  Neurological: Positive for weakness and numbness. Negative for dizziness, tremors, seizures, syncope, facial asymmetry, speech difficulty, light-headedness and headaches.  Psychiatric/Behavioral: Negative for sleep disturbance and dysphoric mood. The patient is not nervous/anxious.    Past Medical History  Diagnosis Date  . Hyperlipidemia   . Arthritis   . Hypercholesterolemia   . Crohn disease   . Angina   . Pneumonia   . CAD (coronary artery disease), non obstructive by cath 07/19/11, treat medically 07/19/2011  . Myocardial infarction   . Cancer   . Skin cancer 2006    Left shoulder   Past Surgical History  Procedure Laterality Date  . Hernia repair    . Knee surgery      arthroscopy  . Tonsillectomy    . Appendectomy    . Cardiac catheterization  07/15/2011    Medical management  . Left heart catheterization with coronary angiogram  07/19/2011    Procedure: LEFT HEART CATHETERIZATION WITH CORONARY ANGIOGRAM;  Surgeon: Leonie Man, MD;  [facility] CATH LAB;  Service: Cardiovascular;;   No Known Allergies Social History   Social History  . Marital Status: Married    Spouse Name: Shirlean Mylar  . Number of Children: 2  . Years of Education: Associates   Occupational History  . BULK DRIVER    Social History Main Topics  . Smoking status: Former Smoker    Quit date: 07/16/1998  . Smokeless  tobacco: Never Used  . Alcohol Use: 4.8 oz/week    8 Cans of beer per week  . Drug Use: No  . Sexual Activity:    Partners: Female    Museum/gallery curator: None   Other Topics Concern  . 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  Lies with his wife and their pets.   Family History  Problem Relation Age of Onset  . Cancer Mother     Breast cancer  . Heart disease Mother 75    CABG       Objective:   Physical Exam   Constitutional: He is oriented to person, place, and time. He appears well-developed and well-nourished. No distress.  HENT:  Head: Normocephalic and atraumatic.  Right Ear: External ear normal.  Left Ear: External ear normal.  Nose: Nose normal.  Mouth/Throat: Oropharynx is clear and moist.  Eyes: Conjunctivae and EOM are normal. Pupils are equal, round, and reactive to light.  Neck: Normal range of motion. Neck supple. Carotid bruit is not present. No thyromegaly present.  Cardiovascular: Normal rate, regular rhythm, normal heart sounds and intact distal pulses.  Exam reveals no gallop and no friction rub.   No murmur heard. Pulmonary/Chest: Effort normal and breath sounds normal. No respiratory distress. He has no wheezes. He has no rales.  Abdominal: Soft. Bowel sounds are normal. He exhibits no distension and no mass. There is no tenderness. There is no rebound and no guarding.  Musculoskeletal: Normal range of motion.  Degenerative changes PIP, DIP joints B hands.  Lymphadenopathy:    He has no cervical adenopathy.  Neurological: He is alert and oriented to person, place, and time. No cranial nerve deficit. Coordination normal.  Skin: Skin is warm and dry. No rash noted. He is not diaphoretic.  Psychiatric: He has a normal mood and affect. His behavior is normal.  Nursing note and vitals reviewed.   Filed Vitals:   10/04/14 0832  BP: 130/70  Pulse: 54  Temp: 97.7 F (36.5 C)  TempSrc: Oral  Height: 5' 8"  (1.727 m)  Weight: 175 lb 6.4 oz (79.561 kg)  SpO2: 98%       Assessment & Plan:  Primary osteoarthritis of both hands  Inflammation of hand joint, unspecified laterality - Plan: meloxicam (MOBIC) 15 MG tablet  Need for prophylactic vaccination against Streptococcus pneumoniae (pneumococcus) - Plan: Pneumococcal conjugate vaccine 13-valent IM  Need for shingles vaccine - Plan: zoster vaccine live, PF, (ZOSTAVAX) 09326 UNT/0.65ML injection  Peptic ulcer disease,  endo 2011  Dyslipidemia  Crohn disease, without complications  Coronary artery disease involving native coronary artery of native heart without angina pectoris   1. OA hands B: persistent; with recent ulnar neuropathy.  Refill of Mobic provided. Discussed the risk of renal strain and PUD with Meloxicam; to monitor closely. 2.  Ulnar neuropathy: New.  S/p surgical repair; refill of Meloxicam.  S/p rheumatology, neurology, and ortho consultations via [employer].  3.  Crohn's disease: stable; followed every six months. 4.  CAD: stable; followed by Dr. Gwenlyn Found every six months; asymptomatic. 5.  PUD: stable; 2011; asymptomatic; avoid NSAIDs. 5.  S/p Prevnar 13. 6. Rx for Zostavax provided.   Meds ordered this encounter  Medications  . meloxicam (MOBIC) 15 MG tablet    Sig: Take 1 tablet (15 mg total) by mouth daily.    Dispense:  30 tablet    Refill:  11  . zoster vaccine live,  PF, (ZOSTAVAX) 16606 UNT/0.65ML injection    Sig: Inject 19,400 Units into the skin once.    Dispense:  0.65 mL    Refill:  0   I personally performed the services described in this documentation, which was scribed in my presence. The recorded information has been reviewed and considered.  Jenyfer Trawick Elayne Guerin, M.D. [facility] [address] [place], [place]  [address] [phone] phone [phone] fax

## 2014-10-06 NOTE — Progress Notes (Signed)
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

## 2014-11-21 ENCOUNTER — Ambulatory Visit: Payer: BLUE CROSS/BLUE SHIELD | Admitting: Cardiovascular Disease

## 2014-11-28 ENCOUNTER — Ambulatory Visit (INDEPENDENT_AMBULATORY_CARE_PROVIDER_SITE_OTHER): Payer: BLUE CROSS/BLUE SHIELD | Admitting: Cardiovascular Disease

## 2014-11-28 ENCOUNTER — Encounter: Payer: Self-pay | Admitting: Cardiovascular Disease

## 2014-11-28 VITALS — BP 124/80 | HR 59 | Ht 68.0 in | Wt 175.0 lb

## 2014-11-28 DIAGNOSIS — I2583 Coronary atherosclerosis due to lipid rich plaque: Secondary | ICD-10-CM

## 2014-11-28 DIAGNOSIS — I2510 Atherosclerotic heart disease of native coronary artery without angina pectoris: Secondary | ICD-10-CM

## 2014-11-28 DIAGNOSIS — E785 Hyperlipidemia, unspecified: Secondary | ICD-10-CM | POA: Diagnosis not present

## 2014-11-28 DIAGNOSIS — I10 Essential (primary) hypertension: Secondary | ICD-10-CM | POA: Diagnosis not present

## 2014-11-28 NOTE — Assessment & Plan Note (Signed)
History of hyperlipidemia on rosuvastatin 40 mg a day followed by his PCP. We will check a lipid liver profile

## 2014-11-28 NOTE — Assessment & Plan Note (Signed)
History of coronary artery disease status post cardiac catheterization performed by Dr. Ellyn Hack 07/15/11 showing noncritical CAD.

## 2014-11-28 NOTE — Patient Instructions (Signed)
Medication Instructions:  Your physician recommends that you continue on your current medications as directed. Please refer to the Current Medication list given to you today.  Labwork: Your physician recommends that you return for lab work in: FASTING (LIPID/LIVER) The lab can be found on the FIRST FLOOR of out building in [address]   Testing/Procedures: none  Follow-Up: Your physician wants you to follow-up in: 12 MONTHS WITH Dr. Gwenlyn Found. You will receive a reminder letter in the mail two months in advance. If you don't receive a letter, please call our office to schedule the follow-up appointment.   Any Other Special Instructions Will Be Listed Below (If Applicable).

## 2014-11-28 NOTE — Assessment & Plan Note (Signed)
History of hypertension blood pressure measured at 124/80. He is on lisinopril. Continue current meds at current dosing

## 2014-11-28 NOTE — Progress Notes (Signed)
11/28/2014 Frederick Rush   [birth year]  [id number]  Primary Physician Reginia Forts, MD Primary Cardiologist: Lorretta Harp MD Renae Gloss  HPI: The patient is a 64 year old mildly overweight, married, Caucasian male, father of two, grandfather to two grandchildren, who I last saw in the office one year ago.Marland Kitchen He has a history of noncritical CAD found on cath performed by Dr. Ula Lingo on Jul 15, 2011. His other problems include hyperlipidemia and remote tobacco abuse. He denies chest pain or shortness of breath.   Current Outpatient Prescriptions  Medication Sig Dispense Refill  . aspirin 81 MG tablet Take 81 mg by mouth daily.    . fluticasone (FLONASE) 50 MCG/ACT nasal spray Place into both nostrils daily.    Marland Kitchen lisinopril (PRINIVIL,ZESTRIL) 20 MG tablet Take 1 tablet (20 mg total) by mouth daily. 90 tablet 3  . meloxicam (MOBIC) 15 MG tablet Take 1 tablet (15 mg total) by mouth daily. 30 tablet 11  . mesalamine (APRISO) 0.375 G 24 hr capsule Take 1,500 mg by mouth every morning. 1520m=4 capsules    . nitroGLYCERIN (NITROSTAT) 0.4 MG SL tablet Place 1 tablet (0.4 mg total) under the tongue every 5 (five) minutes x 3 doses as needed for chest pain. 25 tablet 4  . rosuvastatin (CRESTOR) 40 MG tablet Take 1 tablet (40 mg total) by mouth daily. 30 tablet 6  . zoster vaccine live, PF, (ZOSTAVAX) 183382UNT/0.65ML injection Inject 19,400 Units into the skin once. 0.65 mL 0  . gabapentin (NEURONTIN) 100 MG capsule Take 100 mg by mouth at bedtime.      No current facility-administered medications for this visit.    No Known Allergies  Social History   Social History  . Marital Status: Married    Spouse Name: [name] . Number of Children: 2  . Years of Education: Associates   Occupational History  . BULK DRIVER    Social History Main Topics  . Smoking status: Former Smoker    Quit date: 07/16/1998  . Smokeless tobacco: Never Used  . Alcohol Use: 4.8 oz/week    8 Cans of beer per week  . Drug Use: No  . Sexual Activity:    Partners: Female    BMuseum/gallery curator None   Other Topics Concern  . 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     Review of Systems: General: negative for chills, fever, night sweats or weight changes.  Cardiovascular: negative for chest pain, dyspnea on exertion, edema, orthopnea, palpitations, paroxysmal nocturnal dyspnea or shortness of breath Dermatological: negative for rash Respiratory: negative for cough or wheezing Urologic: negative for hematuria Abdominal: negative for nausea, vomiting, diarrhea, bright red blood per rectum, melena, or hematemesis Neurologic: negative for visual changes, syncope, or dizziness All other systems reviewed and are otherwise negative except as noted above.    Blood pressure 124/80, pulse 59, height 5' 8"  (1.727 m), weight 175 lb (79.379 kg).  General appearance: alert and no distress Neck: no adenopathy, no carotid bruit, no JVD, supple, symmetrical, trachea midline and thyroid not enlarged, symmetric, no tenderness/mass/nodules Lungs: clear to auscultation bilaterally Heart: regular rate and rhythm, S1, S2 normal, no murmur, click, rub or gallop Extremities: extremities normal, atraumatic, no cyanosis or edema  EKG sinus bradycardia 59 without ST or T-wave changes. I personally reviewed this EKG  ASSESSMENT AND PLAN:   Essential hypertension History of  hypertension blood pressure measured at 124/80. He is on lisinopril. Continue current meds at current dosing  Dyslipidemia History of hyperlipidemia on rosuvastatin 40 mg a day followed by his PCP. We will check a lipid liver profile  CAD (coronary artery disease), non obstructive by cath 07/19/11, treat medically History of coronary artery disease status post cardiac catheterization performed by Dr. Ellyn Hack 07/15/11 showing noncritical  CAD.      Lorretta Harp MD FACP,FACC,FAHA, [facility] 11/28/2014 4:16 PM

## 2014-12-02 ENCOUNTER — Telehealth: Payer: Self-pay | Admitting: Cardiovascular Disease

## 2014-12-02 LAB — LIPID PANEL
Cholesterol: 157 mg/dL (ref 125–200)
HDL: 75 mg/dL (ref >=40)
LDL Cholesterol: 69 mg/dL (ref <130)
Total CHOL/HDL Ratio: 2.1 Ratio (ref <=5.0)
Triglycerides: 64 mg/dL (ref <150)
VLDL: 13 mg/dL (ref <30)

## 2014-12-02 LAB — HEPATIC FUNCTION PANEL
ALT: 20 U/L (ref 9–46)
AST: 25 U/L (ref 10–35)
Albumin: 4.7 g/dL (ref 3.6–5.1)
Alkaline Phosphatase: 26 U/L — ABNORMAL LOW (ref 40–115)
Bilirubin, Direct: 0.2 mg/dL (ref <=0.2)
Indirect Bilirubin: 0.6 mg/dL (ref 0.2–1.2)
Total Bilirubin: 0.8 mg/dL (ref 0.2–1.2)
Total Protein: 6.8 g/dL (ref 6.1–8.1)

## 2014-12-02 NOTE — Telephone Encounter (Signed)
New message ° ° ° ° °Returning a nurses call to get lab results °

## 2014-12-03 NOTE — Telephone Encounter (Signed)
Pt given lab results 

## 2015-01-26 ENCOUNTER — Other Ambulatory Visit: Payer: Self-pay | Admitting: Cardiovascular Disease

## 2015-01-26 NOTE — Telephone Encounter (Signed)
REFILL 

## 2015-03-04 ENCOUNTER — Ambulatory Visit (INDEPENDENT_AMBULATORY_CARE_PROVIDER_SITE_OTHER): Payer: BLUE CROSS/BLUE SHIELD | Admitting: Emergency Medicine

## 2015-03-04 VITALS — BP 142/86 | HR 71 | Temp 98.1°F | Resp 16 | Ht 67.0 in | Wt 177.0 lb

## 2015-03-04 DIAGNOSIS — J0140 Acute pansinusitis, unspecified: Secondary | ICD-10-CM | POA: Diagnosis not present

## 2015-03-04 DIAGNOSIS — J209 Acute bronchitis, unspecified: Secondary | ICD-10-CM | POA: Diagnosis not present

## 2015-03-04 MED ORDER — PSEUDOEPHEDRINE-GUAIFENESIN ER 60-600 MG PO TB12: 1.0000 | ORAL_TABLET | Freq: Two times a day (BID) | ORAL | Status: DC

## 2015-03-04 MED ORDER — AMOXICILLIN-POT CLAVULANATE 875-125 MG PO TABS: 1.0000 | ORAL_TABLET | Freq: Two times a day (BID) | ORAL | Status: DC

## 2015-03-04 MED ORDER — HYDROCOD POLST-CPM POLST ER 10-8 MG/5ML PO SUER: 5.0000 mL | Freq: Two times a day (BID) | ORAL | Status: DC

## 2015-03-04 NOTE — Patient Instructions (Signed)

## 2015-03-04 NOTE — Progress Notes (Signed)
Subjective:  Patient ID: [name], male    DOB: Apr 12, 1950  Age: 65 y.o. MRN: [id number]  CC: Sinusitis; Sore Throat; and Cough   HPI Frederick Rush presents   A she has nasal congestion postnasal drainage and purulent nasal drip discharge. He has a sore throat. He has no fever or chills but his fatigue. He has a cough productive purulent sputum. He has no wheezing or shortness of breath. He has no nausea vomiting or stool change. No rash. He's a nonsmoker  History Frederick Rush has a past medical history of Hyperlipidemia; Arthritis; Hypercholesterolemia; Crohn disease ([place]); Angina; Pneumonia; CAD (coronary artery disease), non obstructive by cath 07/19/11, treat medically (07/19/2011); Myocardial infarction ([place]); Cancer [facility]); and Skin cancer (2006).   He has past surgical history that includes Hernia repair; Knee surgery; Tonsillectomy; Appendectomy; Cardiac catheterization (07/15/2011); and left heart catheterization with coronary angiogram (07/19/2011).   His  family history includes Cancer in his mother; Heart disease (age of onset: 30) in his mother.  He   reports that he quit smoking about 16 years ago. He has never used smokeless tobacco. He reports that he drinks about 4.8 oz of alcohol per week. He reports that he does not use illicit drugs.  Outpatient Prescriptions Prior to Visit  Medication Sig Dispense Refill  . aspirin 81 MG tablet Take 81 mg by mouth daily.    . fluticasone (FLONASE) 50 MCG/ACT nasal spray Place into both nostrils daily.    Marland Kitchen lisinopril (PRINIVIL,ZESTRIL) 20 MG tablet Take 1 tablet (20 mg total) by mouth daily. 90 tablet 3  . meloxicam (MOBIC) 15 MG tablet Take 1 tablet (15 mg total) by mouth daily. 30 tablet 11  . mesalamine (APRISO) 0.375 G 24 hr capsule Take 1,500 mg by mouth every morning. 1542m=4 capsules    . nitroGLYCERIN (NITROSTAT) 0.4 MG SL tablet Place 1 tablet (0.4 mg total) under the tongue every 5 (five) minutes x 3 doses as needed for chest  pain. 25 tablet 4  . rosuvastatin (CRESTOR) 40 MG tablet TAKE ONE TABLET BY MOUTH ONCE DAILY 30 tablet 11  . zoster vaccine live, PF, (ZOSTAVAX) 128206UNT/0.65ML injection Inject 19,400 Units into the skin once. 0.65 mL 0  . gabapentin (NEURONTIN) 100 MG capsule Take 100 mg by mouth at bedtime.      No facility-administered medications prior to visit.    Social History   Social History  . Marital Status: Married    Spouse Name: Frederick Rush . Number of Children: 2  . Years of Education: Associates   Occupational History  . BULK DRIVER    Social History Main Topics  . Smoking status: Former Smoker    Quit date: 07/16/1998  . Smokeless tobacco: Never Used  . Alcohol Use: 4.8 oz/week    8 Cans of beer per week  . Drug Use: No  . Sexual Activity:    Partners: Female    BMuseum/gallery curator None   Other Topics Concern  . None   Social History Narrative   Drive a truck for [employer]  Lies with his wife and their pets.     Review of Systems  Constitutional: Negative for fever, chills and appetite change.  HENT: Positive for congestion, postnasal drip, rhinorrhea, sinus pressure and sore throat. Negative for ear pain.   Eyes: Negative for pain and redness.  Respiratory: Positive for cough. Negative for shortness of breath and wheezing.   Cardiovascular: Negative for leg swelling.  Gastrointestinal: Negative  for nausea, vomiting, abdominal pain, diarrhea, constipation and blood in stool.  Endocrine: Negative for polyuria.  Genitourinary: Negative for dysuria, urgency, frequency and flank pain.  Musculoskeletal: Negative for gait problem.  Skin: Negative for rash.  Neurological: Negative for weakness and headaches.  Psychiatric/Behavioral: Negative for confusion and decreased concentration. The patient is not nervous/anxious.     Objective:  BP 142/86 mmHg  Pulse 71  Temp(Src) 98.1 F (36.7 C)  Resp 16  Ht 5' 7"  (1.702 m)  Wt 177 lb (80.287 kg)  BMI 27.72  kg/m2  SpO2 99%  Physical Exam  Constitutional: He is oriented to person, place, and time. He appears well-developed and well-nourished.  HENT:  Head: Normocephalic and atraumatic.  Eyes: Conjunctivae are normal. Pupils are equal, round, and reactive to light.  Pulmonary/Chest: Effort normal.  Musculoskeletal: He exhibits no edema.  Neurological: He is alert and oriented to person, place, and time.  Skin: Skin is dry.  Psychiatric: He has a normal mood and affect. His behavior is normal. Thought content normal.      Assessment & Plan:   Frederick Rush was seen today for sinusitis, sore throat and cough.  Diagnoses and all orders for this visit:  Acute bronchitis, unspecified organism  Acute pansinusitis, recurrence not specified  Other orders -     pseudoephedrine-guaifenesin (MUCINEX D) 60-600 MG 12 hr tablet; Take 1 tablet by mouth every 12 (twelve) hours. -     chlorpheniramine-HYDROcodone (TUSSIONEX PENNKINETIC ER) 10-8 MG/5ML SUER; Take 5 mLs by mouth 2 (two) times daily. -     amoxicillin-clavulanate (AUGMENTIN) 875-125 MG tablet; Take 1 tablet by mouth 2 (two) times daily.  I am having Mr. Frederick Rush start on pseudoephedrine-guaifenesin, chlorpheniramine-HYDROcodone, and amoxicillin-clavulanate. I am also having him maintain his mesalamine, aspirin, nitroGLYCERIN, fluticasone, gabapentin, lisinopril, meloxicam, zoster vaccine live (PF), and rosuvastatin.  Meds ordered this encounter  Medications  . pseudoephedrine-guaifenesin (MUCINEX D) 60-600 MG 12 hr tablet    Sig: Take 1 tablet by mouth every 12 (twelve) hours.    Dispense:  18 tablet    Refill:  0  . chlorpheniramine-HYDROcodone (TUSSIONEX PENNKINETIC ER) 10-8 MG/5ML SUER    Sig: Take 5 mLs by mouth 2 (two) times daily.    Dispense:  60 mL    Refill:  0  . amoxicillin-clavulanate (AUGMENTIN) 875-125 MG tablet    Sig: Take 1 tablet by mouth 2 (two) times daily.    Dispense:  20 tablet    Refill:  0    Appropriate red  flag conditions were discussed with the patient as well as actions that should be taken.  Patient expressed his understanding.  Follow-up: Return if symptoms worsen or fail to improve.  Roselee Culver, MD

## 2015-06-15 ENCOUNTER — Ambulatory Visit (INDEPENDENT_AMBULATORY_CARE_PROVIDER_SITE_OTHER): Payer: BLUE CROSS/BLUE SHIELD | Admitting: Family Medicine

## 2015-06-15 VITALS — BP 126/80 | HR 62 | Temp 97.4°F | Resp 17 | Ht 67.5 in | Wt 174.0 lb

## 2015-06-15 DIAGNOSIS — Z720 Tobacco use: Secondary | ICD-10-CM | POA: Diagnosis not present

## 2015-06-15 DIAGNOSIS — M13149 Monoarthritis, not elsewhere classified, unspecified hand: Secondary | ICD-10-CM | POA: Diagnosis not present

## 2015-06-15 DIAGNOSIS — I2583 Coronary atherosclerosis due to lipid rich plaque: Secondary | ICD-10-CM

## 2015-06-15 DIAGNOSIS — Z8249 Family history of ischemic heart disease and other diseases of the circulatory system: Secondary | ICD-10-CM

## 2015-06-15 DIAGNOSIS — Z0000 Encounter for general adult medical examination without abnormal findings: Secondary | ICD-10-CM

## 2015-06-15 DIAGNOSIS — M19049 Primary osteoarthritis, unspecified hand: Secondary | ICD-10-CM

## 2015-06-15 DIAGNOSIS — I10 Essential (primary) hypertension: Secondary | ICD-10-CM | POA: Diagnosis not present

## 2015-06-15 DIAGNOSIS — Z1159 Encounter for screening for other viral diseases: Secondary | ICD-10-CM

## 2015-06-15 DIAGNOSIS — K5000 Crohn's disease of small intestine without complications: Secondary | ICD-10-CM | POA: Diagnosis not present

## 2015-06-15 DIAGNOSIS — K279 Peptic ulcer, site unspecified, unspecified as acute or chronic, without hemorrhage or perforation: Secondary | ICD-10-CM

## 2015-06-15 DIAGNOSIS — I2510 Atherosclerotic heart disease of native coronary artery without angina pectoris: Secondary | ICD-10-CM

## 2015-06-15 DIAGNOSIS — E785 Hyperlipidemia, unspecified: Secondary | ICD-10-CM

## 2015-06-15 DIAGNOSIS — Z87891 Personal history of nicotine dependence: Secondary | ICD-10-CM

## 2015-06-15 LAB — CBC WITH DIFFERENTIAL/PLATELET
Basophils Absolute: 90 cells/uL (ref 0–200)
Basophils Relative: 1 %
Eosinophils Absolute: 180 cells/uL (ref 15–500)
Eosinophils Relative: 2 %
HCT: 40.4 % (ref 38.5–50.0)
Hemoglobin: 13.6 g/dL (ref 13.2–17.1)
Lymphocytes Relative: 27 %
Lymphs Abs: 2430 cells/uL (ref 850–3900)
MCH: 30.2 pg (ref 27.0–33.0)
MCHC: 33.7 g/dL (ref 32.0–36.0)
MCV: 89.6 fL (ref 80.0–100.0)
MPV: 9 fL (ref 7.5–12.5)
Monocytes Absolute: 720 cells/uL (ref 200–950)
Monocytes Relative: 8 %
Neutro Abs: 5580 cells/uL (ref 1500–7800)
Neutrophils Relative %: 62 %
Platelets: 303 10*3/uL (ref 140–400)
RBC: 4.51 MIL/uL (ref 4.20–5.80)
RDW: 13.6 % (ref 11.0–15.0)
WBC: 9 10*3/uL (ref 3.8–10.8)

## 2015-06-15 LAB — COMPREHENSIVE METABOLIC PANEL
ALT: 25 U/L (ref 9–46)
AST: 26 U/L (ref 10–35)
Albumin: 4.6 g/dL (ref 3.6–5.1)
Alkaline Phosphatase: 24 U/L — ABNORMAL LOW (ref 40–115)
BUN: 19 mg/dL (ref 7–25)
CO2: 26 mmol/L (ref 20–31)
Calcium: 9.5 mg/dL (ref 8.6–10.3)
Chloride: 101 mmol/L (ref 98–110)
Creat: 0.91 mg/dL (ref 0.70–1.25)
Glucose, Bld: 78 mg/dL (ref 65–99)
Potassium: 4.3 mmol/L (ref 3.5–5.3)
Sodium: 138 mmol/L (ref 135–146)
Total Bilirubin: 0.6 mg/dL (ref 0.2–1.2)
Total Protein: 6.9 g/dL (ref 6.1–8.1)

## 2015-06-15 LAB — LIPID PANEL
Cholesterol: 152 mg/dL (ref 125–200)
HDL: 73 mg/dL (ref >=40)
LDL Cholesterol: 65 mg/dL (ref <130)
Total CHOL/HDL Ratio: 2.1 Ratio (ref <=5.0)
Triglycerides: 72 mg/dL (ref <150)
VLDL: 14 mg/dL (ref <30)

## 2015-06-15 LAB — HEPATITIS C ANTIBODY: HCV Ab: NEGATIVE

## 2015-06-15 MED ORDER — MELOXICAM 15 MG PO TABS: 15.0000 mg | ORAL_TABLET | Freq: Every day | ORAL | Status: DC

## 2015-06-15 MED ORDER — LISINOPRIL 20 MG PO TABS: 20.0000 mg | ORAL_TABLET | Freq: Every day | ORAL | Status: DC

## 2015-06-15 NOTE — Patient Instructions (Addendum)
IF you received an x-ray today, you will receive an invoice from [facility]. Please contact [facility] at [phone] with questions or concerns regarding your invoice.   IF you received labwork today, you will receive an invoice from [facility]. Please contact [facility] at [phone] with questions or concerns regarding your invoice.   Our billing staff will not be able to assist you with questions regarding bills from these companies.  You will be contacted with the lab results as soon as they are available. The fastest way to get your results is to activate your My Chart account. Instructions are located on the last page of this paperwork. If you have not heard from [place] regarding the results in 2 weeks, please contact this office.     Keeping you healthy  Get these tests  Blood pressure- Have your blood pressure checked once a year by your healthcare provider.  Normal blood pressure is 120/80  Weight- Have your body mass index (BMI) calculated to screen for obesity.  BMI is a measure of body fat based on height and weight. You can also calculate your own BMI at ViewBanking.si.  Cholesterol- Have your cholesterol checked every year.  Diabetes- Have your blood sugar checked regularly if you have high blood pressure, high cholesterol, have a family history of diabetes or if you are overweight.  Screening for Colon Cancer- Colonoscopy starting at age 55.  Screening may begin sooner depending on your family history and other health conditions. Follow up colonoscopy as directed by your Gastroenterologist.  Screening for Prostate Cancer- Both blood work (PSA) and a rectal exam help screen for Prostate Cancer.  Screening begins at age 64 with African-American men and at age 6 with Caucasian men.  Screening may begin sooner depending on your family history.  Take these medicines  Aspirin- One aspirin daily can help prevent Heart  disease and Stroke.  Flu shot- Every fall.  Tetanus- Every 10 years.  Zostavax- Once after the age of 71 to prevent Shingles.  Pneumonia shot- Once after the age of 47; if you are younger than 70, ask your healthcare provider if you need a Pneumonia shot.  Take these steps  Don't smoke- If you do smoke, talk to your doctor about quitting.  For tips on how to quit, go to www.smokefree.gov or call 1-800-QUIT-NOW.  Be physically active- Exercise 5 days a week for at least 30 minutes.  If you are not already physically active start slow and gradually work up to 30 minutes of moderate physical activity.  Examples of moderate activity include walking briskly, mowing the yard, dancing, swimming, bicycling, etc.  Eat a healthy diet- Eat a variety of healthy food such as fruits, vegetables, low fat milk, low fat cheese, yogurt, lean meant, poultry, fish, beans, tofu, etc. For more information go to www.thenutritionsource.org  Drink alcohol in moderation- Limit alcohol intake to less than two drinks a day. Never drink and drive.  Dentist- Brush and floss twice daily; visit your dentist twice a year.  Depression- Your emotional health is as important as your physical health. If you're feeling down, or losing interest in things you would normally enjoy please talk to your healthcare provider.  Eye exam- Visit your eye doctor every year.  Safe sex- If you may be exposed to a sexually transmitted infection, use a condom.  Seat belts- Seat belts can save your life; always wear one.  Smoke/Carbon Monoxide detectors- These detectors need to be installed  on the appropriate level of your home.  Replace batteries at least once a year.  Skin cancer- When out in the sun, cover up and use sunscreen 15 SPF or higher.  Violence- If anyone is threatening you, please tell your healthcare provider.  Living Will/ Health care power of attorney- Speak with your healthcare provider and family.

## 2015-06-15 NOTE — Progress Notes (Signed)
Subjective:    Patient ID: Frederick Rush, male    DOB: 1950/09/17, 65 y.o.   MRN: [id number]  06/15/2015  CPE   HPI This 65 y.o. male presents for Complete Physical Examination.  Last physical:  DOT in 03/2015. Colonoscopy:  2010; repeat in ten years; [place]; [facility] [facility] [place].  Followed every six months. TDAP:  2015; TD[facility] at [facility].  Pneumovax:  10-04-2014 Zostavax:+shingles in past age 56.   Influenza:  12-12-2013; [facility] 11-2014 Eye exam:  +glasses; next month.  No g/c.  Early cataract Dental exam:  peridontist recently.    Prostate examination: went to see Dr. Kary Kos in 01/2015.  S/p DRE and PSA.  Gave urine sample.    Skin cancer L shoulder: followed every six months.  CAD: followed by [facility] yearly in November.  Crohn's: followed by gastroenterologist every six months.  R ulnar neuropathy: was taking Gabapentin for painful shocks into R fifth digit.    Dr. Davene Costain.  Released.  S/p evaluation by Rheumatology.    Osteoarthritis: knees, back , hands, arms. Meloxicam.  Hyperlipidemia:  Patient reports good compliance with medication, good tolerance to medication, and good symptom control.     Review of Systems  Constitutional: Negative for fever, chills, diaphoresis, activity change, appetite change, fatigue and unexpected weight change.  HENT: Negative for congestion, dental problem, drooling, ear discharge, ear pain, facial swelling, hearing loss, mouth sores, nosebleeds, postnasal drip, rhinorrhea, sinus pressure, sneezing, sore throat, tinnitus, trouble swallowing and voice change.   Eyes: Negative for photophobia, pain, discharge, redness, itching and visual disturbance.  Respiratory: Negative for apnea, cough, choking, chest tightness, shortness of breath, wheezing and stridor.   Cardiovascular: Negative for chest pain, palpitations and leg swelling.  Gastrointestinal: Negative for nausea, vomiting, abdominal pain, diarrhea, constipation  and blood in stool.  Endocrine: Negative for cold intolerance, heat intolerance, polydipsia, polyphagia and polyuria.  Genitourinary: Negative for dysuria, urgency, frequency, hematuria, flank pain, decreased urine volume, discharge, penile swelling, scrotal swelling, enuresis, difficulty urinating, genital sores, penile pain and testicular pain.       Nocturia x 0.  Urine stream is strong.    Musculoskeletal: Negative for myalgias, back pain, joint swelling, arthralgias, gait problem, neck pain and neck stiffness.  Skin: Negative for color change, pallor, rash and wound.  Allergic/Immunologic: Negative for environmental allergies, food allergies and immunocompromised state.  Neurological: Negative for dizziness, tremors, seizures, syncope, facial asymmetry, speech difficulty, weakness, light-headedness, numbness and headaches.  Hematological: Negative for adenopathy. Does not bruise/bleed easily.  Psychiatric/Behavioral: Negative for suicidal ideas, hallucinations, behavioral problems, confusion, sleep disturbance, self-injury, dysphoric mood, decreased concentration and agitation. The patient is not nervous/anxious and is not hyperactive.     Past Medical History  Diagnosis Date  . Hyperlipidemia   . Arthritis   . Hypercholesterolemia   . Crohn disease ([place])   . Angina   . Pneumonia   . CAD (coronary artery disease), non obstructive by cath 07/19/11, treat medically 07/19/2011  . Myocardial infarction ([place])   . Skin cancer 2006    Left shoulder; followed every six months.     Past Surgical History  Procedure Laterality Date  . Hernia repair    . Knee surgery      arthroscopy  . Tonsillectomy    . Appendectomy    . Cardiac catheterization  07/15/2011    Medical management  . Left heart catheterization with coronary angiogram  07/19/2011    Procedure: LEFT HEART CATHETERIZATION WITH CORONARY ANGIOGRAM;  Surgeon: Leonie Man, MD;  Location: [facility] CATH LAB;  Service: Cardiovascular;;    No Known Allergies Current Outpatient Prescriptions  Medication Sig Dispense Refill  . aspirin 81 MG tablet Take 81 mg by mouth daily.    . fluticasone (FLONASE) 50 MCG/ACT nasal spray Place into both nostrils daily.    Marland Kitchen lisinopril (PRINIVIL,ZESTRIL) 20 MG tablet Take 1 tablet (20 mg total) by mouth daily. 90 tablet 3  . meloxicam (MOBIC) 15 MG tablet Take 1 tablet (15 mg total) by mouth daily. 90 tablet 3  . mesalamine (APRISO) 0.375 G 24 hr capsule Take 1,500 mg by mouth every morning. 1547m=4 capsules    . nitroGLYCERIN (NITROSTAT) 0.4 MG SL tablet Place 1 tablet (0.4 mg total) under the tongue every 5 (five) minutes x 3 doses as needed for chest pain. 25 tablet 4  . rosuvastatin (CRESTOR) 40 MG tablet TAKE ONE TABLET BY MOUTH ONCE DAILY 30 tablet 11  . zoster vaccine live, PF, (ZOSTAVAX) 115176UNT/0.65ML injection Inject 19,400 Units into the skin once. (Patient not taking: Reported on 06/15/2015) 0.65 mL 0   No current facility-administered medications for this visit.   Social History   Social History  . Marital Status: Married    Spouse Name: [name] . Number of Children: 2  . Years of Education: Associates   Occupational History  . BULK DRIVER    Social History Main Topics  . Smoking status: Former Smoker    Quit date: 07/16/1998  . Smokeless tobacco: Never Used  . Alcohol Use: 4.8 oz/week    8 Cans of beer per week  . Drug Use: No  . Sexual Activity:    Partners: Female    BMuseum/gallery curator None   Other Topics Concern  . 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      Lives: Lives with his wife and their pets.      Tobacco:  None; quit in 2000; smoked x 15 years + 20 years = 35 years.      Alcohol:    2-3 beers per day      Exercise: job physically demanding; mows yard.      Seatbelt:  100%; no texting      Family History  Problem Relation Age of Onset  . Cancer Mother     Breast cancer; leukemia  . Heart disease Mother 773   CABG       Objective:    BP 126/80 mmHg  Pulse 62  Temp(Src) 97.4 F (36.3 C) (Oral)  Resp 17  Ht 5' 7.5" (1.715 m)  Wt 174 lb (78.926 kg)  BMI 26.83 kg/m2  SpO2 98% Physical Exam  Constitutional: He is oriented to person, place, and time. He appears well-developed and well-nourished. No distress.  HENT:  Head: Normocephalic and atraumatic.  Right Ear: External ear normal.  Left Ear: External ear normal.  Nose: Nose normal.  Mouth/Throat: Oropharynx is clear and moist.  Eyes: Conjunctivae and EOM are normal. Pupils are equal, round, and reactive to light.  Neck: Normal range of motion. Neck supple. Carotid bruit is not present. No thyromegaly present.  Cardiovascular: Normal rate, regular rhythm, normal heart sounds and intact distal pulses.  Exam reveals no gallop and  no friction rub.   No murmur heard. Pulmonary/Chest: Effort normal and breath sounds normal. He has no wheezes. He has no rales.  Abdominal: Soft. Bowel sounds are normal. He exhibits no distension and no mass. There is no tenderness. There is no rebound and no guarding.  Musculoskeletal:       Right shoulder: Normal.       Left shoulder: Normal.       Cervical back: Normal.  Lymphadenopathy:    He has no cervical adenopathy.  Neurological: He is alert and oriented to person, place, and time. He has normal reflexes. No cranial nerve deficit. He exhibits normal muscle tone. Coordination normal.  Skin: Skin is warm and dry. No rash noted. He is not diaphoretic.  Psychiatric: He has a normal mood and affect. His behavior is normal. Judgment and thought content normal.   Results for orders placed or performed in visit on 11/28/14  Hepatic function panel  Result Value Ref Range   Total Bilirubin 0.8 0.2 - 1.2 mg/dL   Bilirubin, Direct 0.2 <=0.2 mg/dL   Indirect  Bilirubin 0.6 0.2 - 1.2 mg/dL   Alkaline Phosphatase 26 (L) 40 - 115 U/L   AST 25 10 - 35 U/L   ALT 20 9 - 46 U/L   Total Protein 6.8 6.1 - 8.1 g/dL   Albumin 4.7 3.6 - 5.1 g/dL  Lipid panel  Result Value Ref Range   Cholesterol 157 125 - 200 mg/dL   Triglycerides 64 <150 mg/dL   HDL 75 >=40 mg/dL   Total CHOL/HDL Ratio 2.1 <=5.0 Ratio   VLDL 13 <30 mg/dL   LDL Cholesterol 69 <130 mg/dL   Depression screen [facility] 2/9 06/15/2015 03/04/2015 10/04/2014  Decreased Interest 0 0 0  Down, Depressed, Hopeless 0 0 0  PHQ - 2 Score 0 0 0   Fall Risk  06/15/2015 10/04/2014  Falls in the past year? Yes No  Number falls in past yr: 1 -  Injury with Fall? No -   Functional Status Survey: Is the patient deaf or have difficulty hearing?: No Does the patient have difficulty seeing, even when wearing glasses/contacts?: No Does the patient have difficulty concentrating, remembering, or making decisions?: No Does the patient have difficulty walking or climbing stairs?: No Does the patient have difficulty dressing or bathing?: No Does the patient have difficulty doing errands alone such as visiting a doctor's office or shopping?: No    Assessment & Plan:   1. Routine physical examination   2. Crohn's disease of small intestine without complication ([place])   3. Essential hypertension   4. Dyslipidemia   5. Coronary artery disease due to lipid rich plaque   6. Family history of coronary artery bypass surgery   7. History of smoking   8. Peptic ulcer disease, endo 2011   9. Need for hepatitis C screening test   10. Inflammation of hand joint, unspecified laterality    -anticipatory guidance --- exercise, weight maintenance. -colonoscopy UTD. -immunizations UTD. -independent with ADLs; one fall in past six months; no evidence of depression. -obtain age appropriate labs. -refills provided. -RTC six months.   Orders Placed This Encounter  Procedures  . CBC with Differential/Platelet  .  Comprehensive metabolic panel    Order Specific Question:  Has the patient fasted?    Answer:  Yes  . Lipid panel    Order Specific Question:  Has the patient fasted?    Answer:  Yes  . Hepatitis C antibody  Meds ordered this encounter  Medications  . lisinopril (PRINIVIL,ZESTRIL) 20 MG tablet    Sig: Take 1 tablet (20 mg total) by mouth daily.    Dispense:  90 tablet    Refill:  3  . meloxicam (MOBIC) 15 MG tablet    Sig: Take 1 tablet (15 mg total) by mouth daily.    Dispense:  90 tablet    Refill:  3    Return in about 6 months (around 12/15/2015) for recheck high blood pressure, high cholesterol.    Patterson Hollenbaugh Elayne Guerin, M.D. [facility] [address] [place], [place]  [address] [phone] phone [phone] fax

## 2015-06-26 ENCOUNTER — Encounter: Payer: Self-pay | Admitting: Family Medicine

## 2015-07-18 ENCOUNTER — Ambulatory Visit (INDEPENDENT_AMBULATORY_CARE_PROVIDER_SITE_OTHER): Payer: BLUE CROSS/BLUE SHIELD | Admitting: Family Medicine

## 2015-07-18 ENCOUNTER — Ambulatory Visit (INDEPENDENT_AMBULATORY_CARE_PROVIDER_SITE_OTHER): Payer: BLUE CROSS/BLUE SHIELD

## 2015-07-18 VITALS — BP 126/70 | HR 50 | Temp 97.8°F | Resp 18 | Ht 67.5 in | Wt 167.6 lb

## 2015-07-18 DIAGNOSIS — R05 Cough: Secondary | ICD-10-CM

## 2015-07-18 DIAGNOSIS — J301 Allergic rhinitis due to pollen: Secondary | ICD-10-CM

## 2015-07-18 DIAGNOSIS — I2510 Atherosclerotic heart disease of native coronary artery without angina pectoris: Secondary | ICD-10-CM

## 2015-07-18 DIAGNOSIS — J9801 Acute bronchospasm: Secondary | ICD-10-CM

## 2015-07-18 DIAGNOSIS — R059 Cough, unspecified: Secondary | ICD-10-CM

## 2015-07-18 DIAGNOSIS — J988 Other specified respiratory disorders: Secondary | ICD-10-CM | POA: Diagnosis not present

## 2015-07-18 DIAGNOSIS — Z720 Tobacco use: Secondary | ICD-10-CM

## 2015-07-18 DIAGNOSIS — J22 Unspecified acute lower respiratory infection: Secondary | ICD-10-CM

## 2015-07-18 DIAGNOSIS — I2583 Coronary atherosclerosis due to lipid rich plaque: Secondary | ICD-10-CM

## 2015-07-18 DIAGNOSIS — Z87891 Personal history of nicotine dependence: Secondary | ICD-10-CM

## 2015-07-18 LAB — POCT CBC
Granulocyte percent: 72.5 %G (ref 37–80)
HCT, POC: 37.2 % — AB (ref 43.5–53.7)
Hemoglobin: 13 g/dL — AB (ref 14.1–18.1)
Lymph, poc: 1.1 (ref 0.6–3.4)
MCH, POC: 31.6 pg — AB (ref 27–31.2)
MCHC: 34.9 g/dL (ref 31.8–35.4)
MCV: 90.4 fL (ref 80–97)
MID (cbc): 0.8 (ref 0–0.9)
MPV: 6.3 fL (ref 0–99.8)
POC Granulocyte: 5.1 (ref 2–6.9)
POC LYMPH PERCENT: 15.6 %L (ref 10–50)
POC MID %: 11.9 %M (ref 0–12)
Platelet Count, POC: 218 10*3/uL (ref 142–424)
RBC: 4.11 M/uL — AB (ref 4.69–6.13)
RDW, POC: 13.3 %
WBC: 7.1 10*3/uL (ref 4.6–10.2)

## 2015-07-18 MED ORDER — ALBUTEROL SULFATE (2.5 MG/3ML) 0.083% IN NEBU
2.5000 mg | INHALATION_SOLUTION | Freq: Once | RESPIRATORY_TRACT | Status: AC
  Administered 2015-07-18: 2.5 mg via RESPIRATORY_TRACT

## 2015-07-18 MED ORDER — PREDNISONE 20 MG PO TABS: ORAL_TABLET | ORAL | Status: DC

## 2015-07-18 MED ORDER — CLARITHROMYCIN 500 MG PO TABS: 500.0000 mg | ORAL_TABLET | Freq: Two times a day (BID) | ORAL | Status: DC

## 2015-07-18 MED ORDER — IPRATROPIUM BROMIDE 0.02 % IN SOLN
0.5000 mg | Freq: Once | RESPIRATORY_TRACT | Status: AC
  Administered 2015-07-18: 0.5 mg via RESPIRATORY_TRACT

## 2015-07-18 MED ORDER — ALBUTEROL SULFATE HFA 108 (90 BASE) MCG/ACT IN AERS: 2.0000 | INHALATION_SPRAY | Freq: Four times a day (QID) | RESPIRATORY_TRACT | Status: DC | PRN

## 2015-07-18 NOTE — Progress Notes (Signed)
Subjective:    Patient ID: Frederick Rush, male    DOB: 07/21/1950, 65 y.o.   MRN: [id number]  07/18/2015  Sinus Problem   HPI This 65 y.o. male presents for evaluation of sinus congestion and wheezing. Onset three days ago.  No fever but +chills; no worsening sweats.  +HA in frontal sinus area.  No ear pain or sore throat; +scratchy throat.  +rhinorrhea clear; +nasal congestion; using Flonase year round.  +cough; no sputum production; +wheezing; +SOB with walking dog.  No v/d.  No other medication for symptoms.  Does have Mucinex D at home yet not using it.    Review of Systems  Constitutional: Positive for chills. Negative for fever, diaphoresis, activity change, appetite change and fatigue.  HENT: Positive for congestion, postnasal drip, rhinorrhea, sinus pressure, sneezing and voice change. Negative for ear pain, sore throat and trouble swallowing.   Respiratory: Positive for cough, shortness of breath and wheezing.   Cardiovascular: Negative for chest pain, palpitations and leg swelling.  Gastrointestinal: Negative for nausea, vomiting, abdominal pain and diarrhea.  Endocrine: Negative for cold intolerance, heat intolerance, polydipsia, polyphagia and polyuria.  Skin: Negative for color change, rash and wound.  Neurological: Positive for headaches. Negative for dizziness, tremors, seizures, syncope, facial asymmetry, speech difficulty, weakness, light-headedness and numbness.  Psychiatric/Behavioral: Negative for sleep disturbance and dysphoric mood. The patient is not nervous/anxious.     Past Medical History  Diagnosis Date  . Hyperlipidemia   . Arthritis   . Hypercholesterolemia   . Crohn disease ([place])   . Angina   . Pneumonia   . CAD (coronary artery disease), non obstructive by cath 07/19/11, treat medically 07/19/2011  . Myocardial infarction ([place])   . Skin cancer 2006    Left shoulder; followed every six months.     Past Surgical History  Procedure Laterality Date    . Hernia repair    . Knee surgery      arthroscopy  . Tonsillectomy    . Appendectomy    . Cardiac catheterization  07/15/2011    Medical management  . Left heart catheterization with coronary angiogram  07/19/2011    Procedure: LEFT HEART CATHETERIZATION WITH CORONARY ANGIOGRAM;  Surgeon: Leonie Man, MD;  Location: [facility] CATH LAB;  Service: Cardiovascular;;   No Known Allergies  Social History   Social History  . Marital Status: Married    Spouse Name: Shirlean Mylar  . Number of Children: 2  . Years of Education: Associates   Occupational History  . BULK DRIVER    Social History Main Topics  . Smoking status: Former Smoker    Quit date: 07/16/1998  . Smokeless tobacco: Never Used  . Alcohol Use: 4.8 oz/week    8 Cans of beer per week  . Drug Use: No  . Sexual Activity:    Partners: Female    Museum/gallery curator: None   Other Topics Concern  . 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      Lives: Lives with his wife and their pets.      Tobacco:  None; quit in 2000; smoked x 15 years + 20 years = 35 years.      Alcohol:    2-3 beers per day  Exercise: job physically demanding; mows yard.      Seatbelt: 100%; no texting      Family History  Problem Relation Age of Onset  . Cancer Mother     Breast cancer; leukemia  . Heart disease Mother 68    CABG       Objective:    BP 126/70 mmHg  Pulse 50  Temp(Src) 97.8 F (36.6 C) (Oral)  Resp 18  Ht 5' 7.5" (1.715 m)  Wt 167 lb 9.6 oz (76.023 kg)  BMI 25.85 kg/m2  SpO2 98% Physical Exam  Constitutional: He is oriented to person, place, and time. He appears well-developed and well-nourished. No distress.  HENT:  Head: Normocephalic and atraumatic.  Right Ear: Tympanic membrane, external ear and ear canal normal.  Left Ear: Tympanic  membrane, external ear and ear canal normal.  Nose: Nose normal.  Mouth/Throat: Uvula is midline, oropharynx is clear and moist and mucous membranes are normal. No oropharyngeal exudate, posterior oropharyngeal edema or posterior oropharyngeal erythema.  +drainage in OP.  Eyes: Conjunctivae and EOM are normal. Pupils are equal, round, and reactive to light.  Neck: Normal range of motion. Neck supple. Carotid bruit is not present. No thyromegaly present.  Cardiovascular: Normal rate, regular rhythm, normal heart sounds and intact distal pulses.  Exam reveals no gallop and no friction rub.   No murmur heard. Pulmonary/Chest: Effort normal. No accessory muscle usage. No respiratory distress. He has wheezes in the right upper field, the right middle field, the right lower field, the left upper field, the left middle field and the left lower field. He has no rales.  Musculoskeletal: He exhibits no edema.  Lymphadenopathy:    He has no cervical adenopathy.  Neurological: He is alert and oriented to person, place, and time. No cranial nerve deficit.  Skin: Skin is warm and dry. No rash noted. He is not diaphoretic.  Psychiatric: He has a normal mood and affect. His behavior is normal.  Nursing note and vitals reviewed.  Results for orders placed or performed in visit on 07/18/15  POCT CBC  Result Value Ref Range   WBC 7.1 4.6 - 10.2 K/uL   Lymph, poc 1.1 0.6 - 3.4   POC LYMPH PERCENT 15.6 10 - 50 %L   MID (cbc) 0.8 0 - 0.9   POC MID % 11.9 0 - 12 %M   POC Granulocyte 5.1 2 - 6.9   Granulocyte percent 72.5 37 - 80 %G   RBC 4.11 (A) 4.69 - 6.13 M/uL   Hemoglobin 13.0 (A) 14.1 - 18.1 g/dL   HCT, POC 37.2 (A) 43.5 - 53.7 %   MCV 90.4 80 - 97 fL   MCH, POC 31.6 (A) 27 - 31.2 pg   MCHC 34.9 31.8 - 35.4 g/dL   RDW, POC 13.3 %   Platelet Count, POC 218 142 - 424 K/uL   MPV 6.3 0 - 99.8 fL   Dg Chest 2 View  07/18/2015  CLINICAL DATA:  65 year old male with cough, wheezing and shortness of  breath. EXAM: CHEST  2 VIEW COMPARISON:  Chest x-ray 07/15/2011. FINDINGS: Lung volumes are normal. No consolidative airspace disease. No pleural effusions. No pneumothorax. No pulmonary nodule or mass noted. Pulmonary vasculature and the cardiomediastinal silhouette are within normal limits. Atherosclerosis in the thoracic aorta. IMPRESSION: 1.  No radiographic evidence of acute cardiopulmonary disease. 2. Atherosclerosis. Electronically Signed   By: Vinnie Langton M.D.   On: 07/18/2015 09:11  Assessment & Plan:   1. Lower respiratory infection   2. Bronchospasm   3. Cough   4. Seasonal allergic rhinitis due to pollen   5. Coronary artery disease due to lipid rich plaque   6. History of smoking    -New. -Rx for Biaxin and Prednisone provided; rx for Albuterol also provided. -start Mucinex D bid for one week. -continue Flonase.   Orders Placed This Encounter  Procedures  . DG Chest 2 View    Standing Status: Future     Number of Occurrences: 1     Standing Expiration Date: 07/17/2016    Order Specific Question:  Reason for Exam (SYMPTOM  OR DIAGNOSIS REQUIRED)    Answer:  cough, wheezing, SOB    Order Specific Question:  Preferred imaging location?    Answer:  External  . POCT CBC   Meds ordered this encounter  Medications  . albuterol (PROVENTIL) (2.5 MG/3ML) 0.083% nebulizer solution 2.5 mg    Sig:   . ipratropium (ATROVENT) nebulizer solution 0.5 mg    Sig:   . clarithromycin (BIAXIN) 500 MG tablet    Sig: Take 1 tablet (500 mg total) by mouth 2 (two) times daily.    Dispense:  20 tablet    Refill:  0  . predniSONE (DELTASONE) 20 MG tablet    Sig: Two tablets daily x 5 days then one tablet daily x 5 days    Dispense:  15 tablet    Refill:  0  . albuterol (PROVENTIL HFA;VENTOLIN HFA) 108 (90 Base) MCG/ACT inhaler    Sig: Inhale 2 puffs into the lungs every 6 (six) hours as needed.    Dispense:  1 Inhaler    Refill:  1    No Follow-up on file.    Daanya Lanphier  Elayne Guerin, M.D. [facility] [address] [place], [place]  [address] [phone] phone [phone] fax

## 2015-07-18 NOTE — Patient Instructions (Addendum)
1.  Start Mucinex D one tablet twice daily. 2.  Continue Flonase. 3.  Take Biaxin and Prednisone as prescribed. 4.  Albuterol inhaler  -- 2 puffs every six hours for wheezing, coughing    IF you received an x-ray today, you will receive an invoice from [facility]. Please contact [facility] at [phone] with questions or concerns regarding your invoice.   IF you received labwork today, you will receive an invoice from [facility] [employer]. Please contact [facility] at [phone] with questions or concerns regarding your invoice.   Our billing staff will not be able to assist you with questions regarding bills from these companies.  You will be contacted with the lab results as soon as they are available. The fastest way to get your results is to activate your My Chart account. Instructions are located on the last page of this paperwork. If you have not heard from [place] regarding the results in 2 weeks, please contact this office.     Bronchospasm, Adult A bronchospasm is a spasm or tightening of the airways going into the lungs. During a bronchospasm breathing becomes more difficult because the airways get smaller. When this happens there can be coughing, a whistling sound when breathing (wheezing), and difficulty breathing. Bronchospasm is often associated with asthma, but not all patients who experience a bronchospasm have asthma. CAUSES  A bronchospasm is caused by inflammation or irritation of the airways. The inflammation or irritation may be triggered by:   Allergies (such as to animals, pollen, food, or mold). Allergens that cause bronchospasm may cause wheezing immediately after exposure or many hours later.   Infection. Viral infections are believed to be the most common cause of bronchospasm.   Exercise.   Irritants (such as pollution, cigarette smoke, strong odors, aerosol sprays, and paint fumes).   Weather changes. Winds increase  molds and pollens in the air. Rain refreshes the air by washing irritants out. Cold air may cause inflammation.   Stress and emotional upset.  SIGNS AND SYMPTOMS   Wheezing.   Excessive nighttime coughing.   Frequent or severe coughing with a simple cold.   Chest tightness.   Shortness of breath.  DIAGNOSIS  Bronchospasm is usually diagnosed through a history and physical exam. Tests, such as chest X-rays, are sometimes done to look for other conditions. TREATMENT   Inhaled medicines can be given to open up your airways and help you breathe. The medicines can be given using either an inhaler or a nebulizer machine.  Corticosteroid medicines may be given for severe bronchospasm, usually when it is associated with asthma. HOME CARE INSTRUCTIONS   Always have a plan prepared for seeking medical care. Know when to call your health care provider and local emergency services (911 in the [place] Know where you can access local emergency care.  Only take medicines as directed by your health care provider.  If you were prescribed an inhaler or nebulizer machine, ask your health care provider to explain how to use it correctly. Always use a spacer with your inhaler if you were given one.  It is necessary to remain calm during an attack. Try to relax and breathe more slowly.  Control your home environment in the following ways:   Change your heating and air conditioning filter at least once a month.   Limit your use of fireplaces and wood stoves.  Do not smoke and do not allow smoking in your home.   Avoid exposure to perfumes and fragrances.  Get rid of pests (such as roaches and mice) and their droppings.   Throw away plants if you see mold on them.   Keep your house clean and dust free.   Replace carpet with wood, tile, or vinyl flooring. Carpet can trap dander and dust.   Use allergy-proof pillows, mattress covers, and box spring covers.   Wash bed sheets  and blankets every week in hot water and dry them in a dryer.   Use blankets that are made of polyester or cotton.   Wash hands frequently. SEEK MEDICAL CARE IF:   You have muscle aches.   You have chest pain.   The sputum changes from clear or white to yellow, green, gray, or bloody.   The sputum you cough up gets thicker.   There are problems that may be related to the medicine you are given, such as a rash, itching, swelling, or trouble breathing.  SEEK IMMEDIATE MEDICAL CARE IF:   You have worsening wheezing and coughing even after taking your prescribed medicines.   You have increased difficulty breathing.   You develop severe chest pain. MAKE SURE YOU:   Understand these instructions.  Will watch your condition.  Will get help right away if you are not doing well or get worse.   This information is not intended to replace advice given to you by your health care provider. Make sure you discuss any questions you have with your health care provider.   Document Released: 02/10/2003 Document Revised: 02/28/2014 Document Reviewed: 07/30/2012 Elsevier Interactive Patient Education [employer].

## 2015-08-26 ENCOUNTER — Ambulatory Visit (INDEPENDENT_AMBULATORY_CARE_PROVIDER_SITE_OTHER): Payer: BLUE CROSS/BLUE SHIELD | Admitting: Family Medicine

## 2015-08-26 VITALS — BP 122/70 | HR 77 | Temp 97.9°F | Resp 18 | Ht 67.5 in | Wt 170.0 lb

## 2015-08-26 DIAGNOSIS — J069 Acute upper respiratory infection, unspecified: Secondary | ICD-10-CM | POA: Diagnosis not present

## 2015-08-26 DIAGNOSIS — J301 Allergic rhinitis due to pollen: Secondary | ICD-10-CM

## 2015-08-26 LAB — POCT RAPID STREP A (OFFICE): Rapid Strep A Screen: NEGATIVE

## 2015-08-26 MED ORDER — CLARITHROMYCIN 500 MG PO TABS: 500.0000 mg | ORAL_TABLET | Freq: Two times a day (BID) | ORAL | Status: DC

## 2015-08-26 MED ORDER — HYDROCOD POLST-CPM POLST ER 10-8 MG/5ML PO SUER: 5.0000 mL | Freq: Two times a day (BID) | ORAL | Status: DC | PRN

## 2015-08-26 NOTE — Patient Instructions (Addendum)
IF you received an x-ray today, you will receive an invoice from [facility]. Please contact [facility] at [phone] with questions or concerns regarding your invoice.   IF you received labwork today, you will receive an invoice from [facility]. Please contact [facility] at [phone] with questions or concerns regarding your invoice.   Our billing staff will not be able to assist you with questions regarding bills from these companies.  You will be contacted with the lab results as soon as they are available. The fastest way to get your results is to activate your My Chart account. Instructions are located on the last page of this paperwork. If you have not heard from us regarding the results in 2 weeks, please contact this office.   Upper Respiratory Infection, Adult Most upper respiratory infections (URIs) are a viral infection of the air passages leading to the lungs. A URI affects the nose, throat, and upper air passages. The most common type of URI is nasopharyngitis and is typically referred to as "the common cold." URIs run their course and usually go away on their own. Most of the time, a URI does not require medical attention, but sometimes a bacterial infection in the upper airways can follow a viral infection. This is called a secondary infection. Sinus and middle ear infections are common types of secondary upper respiratory infections. Bacterial pneumonia can also complicate a URI. A URI can worsen asthma and chronic obstructive pulmonary disease (COPD). Sometimes, these complications can require emergency medical care and may be life threatening.  CAUSES Almost all URIs are caused by viruses. A virus is a type of germ and can spread from one person to another.  RISKS FACTORS You may be at risk for a URI if:   You smoke.   You have chronic heart or lung disease.  You have a weakened defense (immune) system.   You are very young  or very old.   You have nasal allergies or asthma.  You work in crowded or poorly ventilated areas.  You work in health care facilities or schools. SIGNS AND SYMPTOMS  Symptoms typically develop 2-3 days after you come in contact with a cold virus. Most viral URIs last 7-10 days. However, viral URIs from the influenza virus (flu virus) can last 14-18 days and are typically more severe. Symptoms may include:   Runny or stuffy (congested) nose.   Sneezing.   Cough.   Sore throat.   Headache.   Fatigue.   Fever.   Loss of appetite.   Pain in your forehead, behind your eyes, and over your cheekbones (sinus pain).  Muscle aches.  DIAGNOSIS  Your health care provider may diagnose a URI by:  Physical exam.  Tests to check that your symptoms are not due to another condition such as:  Strep throat.  Sinusitis.  Pneumonia.  Asthma. TREATMENT  A URI goes away on its own with time. It cannot be cured with medicines, but medicines may be prescribed or recommended to relieve symptoms. Medicines may help:  Reduce your fever.  Reduce your cough.  Relieve nasal congestion. HOME CARE INSTRUCTIONS   Take medicines only as directed by your health care provider.   Gargle warm saltwater or take cough drops to comfort your throat as directed by your health care provider.  Use a warm mist humidifier or inhale steam from a shower to increase air moisture. This may make it easier to breathe.  Drink enough fluid to keep your   urine clear or pale yellow.   Eat soups and other clear broths and maintain good nutrition.   Rest as needed.   Return to work when your temperature has returned to normal or as your health care provider advises. You may need to stay home longer to avoid infecting others. You can also use a face mask and careful hand washing to prevent spread of the virus.  Increase the usage of your inhaler if you have asthma.   Do not use any tobacco  products, including cigarettes, chewing tobacco, or electronic cigarettes. If you need help quitting, ask your health care provider. PREVENTION  The best way to protect yourself from getting a cold is to practice good hygiene.   Avoid oral or hand contact with people with cold symptoms.   Wash your hands often if contact occurs.  There is no clear evidence that vitamin C, vitamin E, echinacea, or exercise reduces the chance of developing a cold. However, it is always recommended to get plenty of rest, exercise, and practice good nutrition.  SEEK MEDICAL CARE IF:   You are getting worse rather than better.   Your symptoms are not controlled by medicine.   You have chills.  You have worsening shortness of breath.  You have brown or red mucus.  You have yellow or brown nasal discharge.  You have pain in your face, especially when you bend forward.  You have a fever.  You have swollen neck glands.  You have pain while swallowing.  You have white areas in the back of your throat. SEEK IMMEDIATE MEDICAL CARE IF:   You have severe or persistent:  Headache.  Ear pain.  Sinus pain.  Chest pain.  You have chronic lung disease and any of the following:  Wheezing.  Prolonged cough.  Coughing up blood.  A change in your usual mucus.  You have a stiff neck.  You have changes in your:  Vision.  Hearing.  Thinking.  Mood. MAKE SURE YOU:   Understand these instructions.  Will watch your condition.  Will get help right away if you are not doing well or get worse.   This information is not intended to replace advice given to you by your health care provider. Make sure you discuss any questions you have with your health care provider.   Document Released: 08/03/2000 Document Revised: 06/24/2014 Document Reviewed: 05/15/2013 Elsevier Interactive Patient Education 2016 Elsevier Inc.  

## 2015-08-26 NOTE — Progress Notes (Signed)
Subjective:    Patient ID: MINOR IDEN, male    DOB: [birth year], 65 y.o.   MRN: [id number]  08/26/2015  Follow-up (URI)   HPI This 65 y.o. male presents for evaluation of persistent cough, rhinorrhea.  Three days ago developed significant nasal congestion with L nasal pain.  With throat clearing, large amount of nasal congestion.  No fever/chills/sweats.   Chronic night sweats for years; nothing worse.  +hoarseness.  +scratchy throat.  Started coughing excessively.  Completely quit coughing.  +PND.  No SOB.  No wheezing.  Sister in law sick for seven weeks.  Wife works at [employer].  Wife also sick with similar symptoms.  Wife has been coughing for seven weeks.   Takes Flonase daily.   Treated with Biaxin, Prednisone, Mucinex D and Flonase on 07/18/15.  S/p CXR negative and CBC WNL. Completely resolved with this treatment; now sick again.  Leg cramps: has Centrum three days per week.  Drinks a lot of water.    Review of Systems  Constitutional: Positive for diaphoresis. Negative for activity change, appetite change, chills, fatigue and fever.  HENT: Positive for congestion, postnasal drip and rhinorrhea. Negative for ear pain, sinus pressure and sore throat.   Respiratory: Positive for cough. Negative for shortness of breath.   Cardiovascular: Negative for chest pain, palpitations and leg swelling.  Gastrointestinal: Negative for nausea, vomiting, abdominal pain and diarrhea.  Endocrine: Negative for cold intolerance, heat intolerance, polydipsia, polyphagia and polyuria.  Skin: Negative for color change, rash and wound.  Neurological: Negative for dizziness, tremors, seizures, syncope, facial asymmetry, speech difficulty, weakness, light-headedness, numbness and headaches.  Psychiatric/Behavioral: Negative for sleep disturbance and dysphoric mood. The patient is not nervous/anxious.     Past Medical History:  Diagnosis Date  . Angina   . Arthritis   . CAD (coronary artery  disease), non obstructive by cath 07/19/11, treat medically 07/19/2011  . Crohn disease ([place])   . Hypercholesterolemia   . Hyperlipidemia   . Myocardial infarction ([place])   . Pneumonia   . Skin cancer 2006   Left shoulder; followed every six months.     Past Surgical History:  Procedure Laterality Date  . APPENDECTOMY    . CARDIAC CATHETERIZATION  07/15/2011   Medical management  . HERNIA REPAIR    . KNEE SURGERY     arthroscopy  . LEFT HEART CATHETERIZATION WITH CORONARY ANGIOGRAM  07/19/2011   Procedure: LEFT HEART CATHETERIZATION WITH CORONARY ANGIOGRAM;  Surgeon: Leonie Man, MD;  Location: [facility] CATH LAB;  Service: Cardiovascular;;  . TONSILLECTOMY     No Known Allergies  Social History   Social History  . Marital status: Married    Spouse name: Shirlean Mylar  . Number of children: 2  . 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   Social History Main Topics  . Smoking status: Former Smoker    Quit date: 07/16/1998  . Smokeless tobacco: Never Used  . Alcohol use 4.8 oz/week    8 Cans of beer per week  . Drug use: No  . Sexual activity: Yes    Partners: Female    Birth control/ protection: None   Other Topics Concern  . 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      Lives: Lives with his wife and their pets.      Tobacco:  None; quit in 2000; smoked x 15 years + 20 years = 35 years.      Alcohol:    2-3 beers per day      Exercise: job physically demanding; mows yard.      Seatbelt: 100%; no texting      Family History  Problem Relation Age of Onset  . Cancer Mother     Breast cancer; leukemia  . Heart disease Mother 32    CABG       Objective:    BP 122/70   Pulse 77   Temp 97.9 F (36.6 C) (Oral)   Resp 18   Ht 5' 7.5" (1.715 m)   Wt 170  lb (77.1 kg)   SpO2 98%   BMI 26.23 kg/m  Physical Exam  Constitutional: He is oriented to person, place, and time. He appears well-developed and well-nourished. No distress.  HENT:  Head: Normocephalic and atraumatic.  Right Ear: Tympanic membrane, external ear and ear canal normal.  Left Ear: Tympanic membrane, external ear and ear canal normal.  Nose: Mucosal edema and rhinorrhea present. Right sinus exhibits no maxillary sinus tenderness and no frontal sinus tenderness. Left sinus exhibits no maxillary sinus tenderness and no frontal sinus tenderness.  Mouth/Throat: Uvula is midline, oropharynx is clear and moist and mucous membranes are normal.  Eyes: Conjunctivae and EOM are normal. Pupils are equal, round, and reactive to light.  Neck: Normal range of motion. Neck supple. Carotid bruit is not present. No thyromegaly present.  Cardiovascular: Normal rate, regular rhythm, normal heart sounds and intact distal pulses.  Exam reveals no gallop and no friction rub.   No murmur heard. Pulmonary/Chest: Effort normal and breath sounds normal. He has no wheezes. He has no rales.  Abdominal: Soft. Bowel sounds are normal. He exhibits no distension and no mass. There is no tenderness. There is no rebound and no guarding.  Lymphadenopathy:    He has no cervical adenopathy.  Neurological: He is alert and oriented to person, place, and time. No cranial nerve deficit.  Skin: Skin is warm and dry. No rash noted. He is not diaphoretic.  Psychiatric: He has a normal mood and affect. His behavior is normal.  Nursing note and vitals reviewed.       Assessment & Plan:   1. Acute upper respiratory infection   2. Seasonal allergic rhinitis due to pollen    -New. -consistent with viral syndrome. -continue Flonase; rx for Tussionex provided. -if no improvement in one week, start Biaxin.   Orders Placed This Encounter  Procedures  . Culture, Group A Strep    Order Specific Question:   Source     Answer:   oropharynx  . POCT rapid strep A   Meds ordered this encounter  Medications  . chlorpheniramine-HYDROcodone (TUSSIONEX PENNKINETIC ER) 10-8 MG/5ML SUER    Sig: Take 5 mLs by mouth every 12 (twelve) hours as needed for cough.    Dispense:  180 mL    Refill:  0  . clarithromycin (BIAXIN) 500 MG tablet    Sig: Take 1 tablet (500 mg total) by mouth 2 (two) times daily.    Dispense:  20 tablet    Refill:  0    No Follow-up on file.    Galen Russman Elayne Guerin, M.D. [facility]  [address] [place],   [address] [phone] phone [phone] fax

## 2015-08-28 LAB — CULTURE, GROUP A STREP: Organism ID, Bacteria: NORMAL

## 2015-10-07 ENCOUNTER — Ambulatory Visit (INDEPENDENT_AMBULATORY_CARE_PROVIDER_SITE_OTHER): Payer: BLUE CROSS/BLUE SHIELD | Admitting: Family Medicine

## 2015-10-07 ENCOUNTER — Ambulatory Visit (INDEPENDENT_AMBULATORY_CARE_PROVIDER_SITE_OTHER): Payer: BLUE CROSS/BLUE SHIELD

## 2015-10-07 ENCOUNTER — Encounter: Payer: Self-pay | Admitting: Family Medicine

## 2015-10-07 VITALS — BP 120/78 | HR 64 | Temp 97.9°F | Resp 16 | Ht 67.5 in | Wt 175.2 lb

## 2015-10-07 DIAGNOSIS — M25511 Pain in right shoulder: Secondary | ICD-10-CM | POA: Diagnosis not present

## 2015-10-07 DIAGNOSIS — M19042 Primary osteoarthritis, left hand: Secondary | ICD-10-CM

## 2015-10-07 DIAGNOSIS — M19011 Primary osteoarthritis, right shoulder: Secondary | ICD-10-CM | POA: Diagnosis not present

## 2015-10-07 DIAGNOSIS — M542 Cervicalgia: Secondary | ICD-10-CM

## 2015-10-07 DIAGNOSIS — M5030 Other cervical disc degeneration, unspecified cervical region: Secondary | ICD-10-CM | POA: Diagnosis not present

## 2015-10-07 DIAGNOSIS — M19041 Primary osteoarthritis, right hand: Secondary | ICD-10-CM

## 2015-10-07 MED ORDER — PREDNISONE 20 MG PO TABS: ORAL_TABLET | ORAL | 0 refills | Status: DC

## 2015-10-07 MED ORDER — METHOCARBAMOL 500 MG PO TABS: 500.0000 mg | ORAL_TABLET | Freq: Three times a day (TID) | ORAL | 0 refills | Status: DC | PRN

## 2015-10-07 NOTE — Progress Notes (Signed)
Subjective:    Patient ID: Frederick Rush, male    DOB: 01-Feb-1951, 65 y.o.   MRN: [id number]  10/07/2015  Shoulder Pain (right shoulder with pain radiating to right side of neck and back x 2 weeks ) and Other (patient denies any injury, woke up with the pain )   HPI This 65 y.o. male presents for evaluation of R shoulder pain and neck pain.  Awoke with crick in neck two weeks ago.  No improvement.  R posterior shoulder blade and anterior shoulder hurts.  Now causing a headache.  Has taken nothing.  Constant pain that worsens during the day progresses. Awoke at 10:15pm last night.  No radiatin into arms.  No weakness.  Pain with movement in posterior shoulder.  No n/t/burning in arm now.  Did feel tingling the other day.  Taking Naproxen sodium four every morning.  Orthopedist: [facility] in [place]. Previous evaluation by [facility] in 2015; chronic osteoarthritis in hands requires daily Mobic.   Review of Systems  Constitutional: Negative for chills, diaphoresis, fatigue and fever.  Musculoskeletal: Positive for arthralgias, myalgias, neck pain and neck stiffness. Negative for back pain, gait problem and joint swelling.  Neurological: Negative for weakness and numbness.    Past Medical History:  Diagnosis Date  . Angina   . Arthritis   . CAD (coronary artery disease), non obstructive by cath 07/19/11, treat medically 07/19/2011  . Crohn disease ([place])   . Hypercholesterolemia   . Hyperlipidemia   . Myocardial infarction ([place])   . Pneumonia   . Skin cancer 2006   Left shoulder; followed every six months.     Past Surgical History:  Procedure Laterality Date  . APPENDECTOMY    . CARDIAC CATHETERIZATION  07/15/2011   Medical management  . HERNIA REPAIR    . KNEE SURGERY     arthroscopy  . LEFT HEART CATHETERIZATION WITH CORONARY ANGIOGRAM  07/19/2011   Procedure: LEFT HEART CATHETERIZATION WITH CORONARY ANGIOGRAM;  Surgeon: Leonie Man, MD;  Location: [facility] CATH  LAB;  Service: Cardiovascular;;  . TONSILLECTOMY     No Known Allergies  Social History   Social History  . Marital status: Married    Spouse name: Shirlean Mylar  . Number of children: 2  . 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   Social History Main Topics  . Smoking status: Former Smoker    Quit date: 07/16/1998  . Smokeless tobacco: Never Used  . Alcohol use 4.8 oz/week    8 Cans of beer per week  . Drug use: No  . Sexual activity: Yes    Partners: Female    Birth control/ protection: None   Other Topics Concern  . 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      Lives: Lives with his wife and their pets.      Tobacco:  None; quit in 2000; smoked x 15 years + 20 years = 35 years.      Alcohol:    2-3 beers per day      Exercise: job physically demanding; mows yard.      Seatbelt: 100%; no texting      Family History  Problem Relation  Age of Onset  . Cancer Mother     Breast cancer; leukemia  . Heart disease Mother 26    CABG       Objective:    BP 120/78 (BP Location: Left Arm, Patient Position: Sitting, Cuff Size: Normal)   Pulse 64   Temp 97.9 F (36.6 C) (Oral)   Resp 16   Ht 5' 7.5" (1.715 m)   Wt 175 lb 3.2 oz (79.5 kg)   SpO2 99%   BMI 27.04 kg/m  Physical Exam  Constitutional: He is oriented to person, place, and time. He appears well-developed and well-nourished. No distress.  HENT:  Head: Normocephalic and atraumatic.  Eyes: Conjunctivae and EOM are normal. Pupils are equal, round, and reactive to light.  Neck: Normal range of motion. Neck supple. Carotid bruit is not present. No thyromegaly present.  Cardiovascular: Normal rate, regular rhythm, normal heart sounds and intact distal pulses.  Exam reveals no gallop and no friction  rub.   No murmur heard. Pulmonary/Chest: Effort normal and breath sounds normal. He has no wheezes. He has no rales.  Musculoskeletal:       Right shoulder: He exhibits decreased range of motion. He exhibits no tenderness, no bony tenderness, no swelling, no deformity, no laceration, no pain, no spasm, normal pulse and normal strength.       Cervical back: He exhibits pain and spasm. He exhibits normal range of motion, no tenderness, no bony tenderness and normal pulse.  Cervical spine: non-tender midline; non-tender paraspinal regions B; full ROM cervical spine without limitation.  Motor 5/5 BUE.  Grip 5/5. +pain with flexion, lateral bending, and rotation side to side. R SHOULDER: pain with elevation above 150 degrees; cross over positive; empty can negative.  Cross over positive;motor 5/5; internal and external rotation normal.   Lymphadenopathy:    He has no cervical adenopathy.  Neurological: He is alert and oriented to person, place, and time. No cranial nerve deficit.  Skin: Skin is warm and dry. No rash noted. He is not diaphoretic.  Psychiatric: He has a normal mood and affect. His behavior is normal.  Nursing note and vitals reviewed.  Dg Cervical Spine Complete  Result Date: 10/07/2015 CLINICAL DATA:  Right-sided neck and shoulder pain over the last 2 weeks. EXAM: CERVICAL SPINE - COMPLETE 4+ VIEW COMPARISON:  09/26/2010 FINDINGS: Alignment is normal. There is degenerative spondylosis in the midcervical region with disc space narrowing and endplate osteophytes at C4-5, C5-6 and C6-7. There is mid cervical facet osteoarthritis bilaterally. These findings have progressed since 2012. Some carotid calcification is noted. IMPRESSION: Worsening degenerative disease since 2012. Disc space narrowing and facet arthropathy in the midcervical spine could relate to neck pain or cervical radiculopathy. Electronically Signed   By: Nelson Chimes M.D.   On: 10/07/2015 16:25   Dg Shoulder  Right  Result Date: 10/07/2015 CLINICAL DATA:  Right-sided neck and shoulder pain. EXAM: RIGHT SHOULDER - 2+ VIEW COMPARISON:  None. FINDINGS: Glenohumeral joint shows mild joint space narrowing with small subchondral cysts of the glenoid. Humeral acromial distance is normal. AC joint is normal. IMPRESSION: Mild osteoarthritis of the glenohumeral joint.  No acute finding. Electronically Signed   By: Nelson Chimes M.D.   On: 10/07/2015 16:26        Assessment & Plan:   1. Pain in joint of right shoulder   2. Neck pain   3. Degenerative disc disease, cervical   4. Osteoarthritis of right shoulder, unspecified osteoarthritis type    -  New. -Rx for Prednisone taper; to HOLD Mobic and Naproxen while taking Prednisone taper. -rx for Robaxin provided. -home exercise program provided. -if no improvement in two weeks, call for ortho referral to [facility] in [place].   Orders Placed This Encounter  Procedures  . DG Cervical Spine Complete    Standing Status:   Future    Number of Occurrences:   1    Standing Expiration Date:   10/06/2016    Order Specific Question:   Reason for Exam (SYMPTOM  OR DIAGNOSIS REQUIRED)    Answer:   R sided neck pain and R shoulder pain    Order Specific Question:   Preferred imaging location?    Answer:   External  . DG Shoulder Right    Standing Status:   Future    Number of Occurrences:   1    Standing Expiration Date:   10/06/2016    Order Specific Question:   Reason for Exam (SYMPTOM  OR DIAGNOSIS REQUIRED)    Answer:   R sided neck pain and R shoulder pain    Order Specific Question:   Preferred imaging location?    Answer:   External   Meds ordered this encounter  Medications  . predniSONE (DELTASONE) 20 MG tablet    Sig: Three tablets daily x 2 days then two tablets daily x 5 days then one tablet daily x 5 days    Dispense:  21 tablet    Refill:  0  . methocarbamol (ROBAXIN) 500 MG tablet    Sig: Take 1 tablet (500 mg total) by mouth  every 8 (eight) hours as needed for muscle spasms.    Dispense:  30 tablet    Refill:  0    No Follow-up on file.   Hoover Grewe Elayne Guerin, M.D. [facility] [address] [place], [place]  [address] [phone] phone [phone] fax

## 2015-10-07 NOTE — Patient Instructions (Addendum)
IF you received an x-ray today, you will receive an invoice from [facility]. Please contact [facility] at [phone] with questions or concerns regarding your invoice.   IF you received labwork today, you will receive an invoice from [employer]. Please contact [facility] at [phone] with questions or concerns regarding your invoice.   Our billing staff will not be able to assist you with questions regarding bills from these companies.  You will be contacted with the lab results as soon as they are available. The fastest way to get your results is to activate your My Chart account. Instructions are located on the last page of this paperwork. If you have not heard from [place] regarding the results in 2 weeks, please contact this office.      Cervical Strain and Sprain With Rehab Cervical strain and sprain are injuries that commonly occur with "whiplash" injuries. Whiplash occurs when the neck is forcefully whipped backward or forward, such as during a motor vehicle accident or during contact sports. The muscles, ligaments, tendons, discs, and nerves of the neck are susceptible to injury when this occurs. RISK FACTORS Risk of having a whiplash injury increases if:  Osteoarthritis of the spine.  Situations that make head or neck accidents or trauma more likely.  High-risk sports (football, rugby, wrestling, hockey, auto racing, gymnastics, diving, contact karate, or boxing).  Poor strength and flexibility of the neck.  Previous neck injury.  Poor tackling technique.  Improperly fitted or padded equipment. SYMPTOMS   Pain or stiffness in the front or back of neck or both.  Symptoms may present immediately or up to 24 hours after injury.  Dizziness, headache, nausea, and vomiting.  Muscle spasm with soreness and stiffness in the neck.  Tenderness and swelling at the injury site. PREVENTION  Learn and use proper technique  (avoid tackling with the head, spearing, and head-butting; use proper falling techniques to avoid landing on the head).  Warm up and stretch properly before activity.  Maintain physical fitness:  Strength, flexibility, and endurance.  Cardiovascular fitness.  Wear properly fitted and padded protective equipment, such as padded soft collars, for participation in contact sports. PROGNOSIS  Recovery from cervical strain and sprain injuries is dependent on the extent of the injury. These injuries are usually curable in 1 week to 3 months with appropriate treatment.  RELATED COMPLICATIONS   Temporary numbness and weakness may occur if the nerve roots are damaged, and this may persist until the nerve has completely healed.  Chronic pain due to frequent recurrence of symptoms.  Prolonged healing, especially if activity is resumed too soon (before complete recovery). TREATMENT  Treatment initially involves the use of ice and medication to help reduce pain and inflammation. It is also important to perform strengthening and stretching exercises and modify activities that worsen symptoms so the injury does not get worse. These exercises may be performed at home or with a therapist. For patients who experience severe symptoms, a soft, padded collar may be recommended to be worn around the neck.  Improving your posture may help reduce symptoms. Posture improvement includes pulling your chin and abdomen in while sitting or standing. If you are sitting, sit in a firm chair with your buttocks against the back of the chair. While sleeping, try replacing your pillow with a small towel rolled to 2 inches in diameter, or use a cervical pillow or soft cervical collar. Poor sleeping positions delay healing.  For patients with nerve root  damage, which causes numbness or weakness, the use of a cervical traction apparatus may be recommended. Surgery is rarely necessary for these injuries. However, cervical strain and  sprains that are present at birth (congenital) may require surgery. MEDICATION   If pain medication is necessary, nonsteroidal anti-inflammatory medications, such as aspirin and ibuprofen, or other minor pain relievers, such as acetaminophen, are often recommended.  Do not take pain medication for 7 days before surgery.  Prescription pain relievers may be given if deemed necessary by your caregiver. Use only as directed and only as much as you need. HEAT AND COLD:   Cold treatment (icing) relieves pain and reduces inflammation. Cold treatment should be applied for 10 to 15 minutes every 2 to 3 hours for inflammation and pain and immediately after any activity that aggravates your symptoms. Use ice packs or an ice massage.  Heat treatment may be used prior to performing the stretching and strengthening activities prescribed by your caregiver, physical therapist, or athletic trainer. Use a heat Ahmia Colford or a warm soak. SEEK MEDICAL CARE IF:   Symptoms get worse or do not improve in 2 weeks despite treatment.  New, unexplained symptoms develop (drugs used in treatment may produce side effects). EXERCISES RANGE OF MOTION (ROM) AND STRETCHING EXERCISES - Cervical Strain and Sprain These exercises may help you when beginning to rehabilitate your injury. In order to successfully resolve your symptoms, you must improve your posture. These exercises are designed to help reduce the forward-head and rounded-shoulder posture which contributes to this condition. Your symptoms may resolve with or without further involvement from your physician, physical therapist or athletic trainer. While completing these exercises, remember:   Restoring tissue flexibility helps normal motion to return to the joints. This allows healthier, less painful movement and activity.  An effective stretch should be held for at least 20 seconds, although you may need to begin with shorter hold times for comfort.  A stretch should  never be painful. You should only feel a gentle lengthening or release in the stretched tissue. STRETCH- Axial Extensors  Lie on your back on the floor. You may bend your knees for comfort. Place a rolled-up hand towel or dish towel, about 2 inches in diameter, under the part of your head that makes contact with the floor.  Gently tuck your chin, as if trying to make a "double chin," until you feel a gentle stretch at the base of your head.  Hold __________ seconds. Repeat __________ times. Complete this exercise __________ times per day.  STRETCH - Axial Extension   Stand or sit on a firm surface. Assume a good posture: chest up, shoulders drawn back, abdominal muscles slightly tense, knees unlocked (if standing) and feet hip width apart.  Slowly retract your chin so your head slides back and your chin slightly lowers. Continue to look straight ahead.  You should feel a gentle stretch in the back of your head. Be certain not to feel an aggressive stretch since this can cause headaches later.  Hold for __________ seconds. Repeat __________ times. Complete this exercise __________ times per day. STRETCH - Cervical Side Bend   Stand or sit on a firm surface. Assume a good posture: chest up, shoulders drawn back, abdominal muscles slightly tense, knees unlocked (if standing) and feet hip width apart.  Without letting your nose or shoulders move, slowly tip your right / left ear to your shoulder until your feel a gentle stretch in the muscles on the opposite side of your  neck.  Hold __________ seconds. Repeat __________ times. Complete this exercise __________ times per day. STRETCH - Cervical Rotators   Stand or sit on a firm surface. Assume a good posture: chest up, shoulders drawn back, abdominal muscles slightly tense, knees unlocked (if standing) and feet hip width apart.  Keeping your eyes level with the ground, slowly turn your head until you feel a gentle stretch along the back and  opposite side of your neck.  Hold __________ seconds. Repeat __________ times. Complete this exercise __________ times per day. RANGE OF MOTION - Neck Circles   Stand or sit on a firm surface. Assume a good posture: chest up, shoulders drawn back, abdominal muscles slightly tense, knees unlocked (if standing) and feet hip width apart.  Gently roll your head down and around from the back of one shoulder to the back of the other. The motion should never be forced or painful.  Repeat the motion 10-20 times, or until you feel the neck muscles relax and loosen. Repeat __________ times. Complete the exercise __________ times per day. STRENGTHENING EXERCISES - Cervical Strain and Sprain These exercises may help you when beginning to rehabilitate your injury. They may resolve your symptoms with or without further involvement from your physician, physical therapist, or athletic trainer. While completing these exercises, remember:   Muscles can gain both the endurance and the strength needed for everyday activities through controlled exercises.  Complete these exercises as instructed by your physician, physical therapist, or athletic trainer. Progress the resistance and repetitions only as guided.  You may experience muscle soreness or fatigue, but the pain or discomfort you are trying to eliminate should never worsen during these exercises. If this pain does worsen, stop and make certain you are following the directions exactly. If the pain is still present after adjustments, discontinue the exercise until you can discuss the trouble with your clinician. STRENGTH - Cervical Flexors, Isometric  Face a wall, standing about 6 inches away. Place a small pillow, a ball about 6-8 inches in diameter, or a folded towel between your forehead and the wall.  Slightly tuck your chin and gently push your forehead into the soft object. Push only with mild to moderate intensity, building up tension gradually. Keep  your jaw and forehead relaxed.  Hold 10 to 20 seconds. Keep your breathing relaxed.  Release the tension slowly. Relax your neck muscles completely before you start the next repetition. Repeat __________ times. Complete this exercise __________ times per day. STRENGTH- Cervical Lateral Flexors, Isometric   Stand about 6 inches away from a wall. Place a small pillow, a ball about 6-8 inches in diameter, or a folded towel between the side of your head and the wall.  Slightly tuck your chin and gently tilt your head into the soft object. Push only with mild to moderate intensity, building up tension gradually. Keep your jaw and forehead relaxed.  Hold 10 to 20 seconds. Keep your breathing relaxed.  Release the tension slowly. Relax your neck muscles completely before you start the next repetition. Repeat __________ times. Complete this exercise __________ times per day. STRENGTH - Cervical Extensors, Isometric   Stand about 6 inches away from a wall. Place a small pillow, a ball about 6-8 inches in diameter, or a folded towel between the back of your head and the wall.  Slightly tuck your chin and gently tilt your head back into the soft object. Push only with mild to moderate intensity, building up tension gradually. Keep your jaw  and forehead relaxed.  Hold 10 to 20 seconds. Keep your breathing relaxed.  Release the tension slowly. Relax your neck muscles completely before you start the next repetition. Repeat __________ times. Complete this exercise __________ times per day. POSTURE AND BODY MECHANICS CONSIDERATIONS - Cervical Strain and Sprain Keeping correct posture when sitting, standing or completing your activities will reduce the stress put on different body tissues, allowing injured tissues a chance to heal and limiting painful experiences. The following are general guidelines for improved posture. Your physician or physical therapist will provide you with any instructions specific  to your needs. While reading these guidelines, remember:  The exercises prescribed by your provider will help you have the flexibility and strength to maintain correct postures.  The correct posture provides the optimal environment for your joints to work. All of your joints have less wear and tear when properly supported by a spine with good posture. This means you will experience a healthier, less painful body.  Correct posture must be practiced with all of your activities, especially prolonged sitting and standing. Correct posture is as important when doing repetitive low-stress activities (typing) as it is when doing a single heavy-load activity (lifting). PROLONGED STANDING WHILE SLIGHTLY LEANING FORWARD When completing a task that requires you to lean forward while standing in one place for a long time, place either foot up on a stationary 2- to 4-inch high object to help maintain the best posture. When both feet are on the ground, the low back tends to lose its slight inward curve. If this curve flattens (or becomes too large), then the back and your other joints will experience too much stress, fatigue more quickly, and can cause pain.  RESTING POSITIONS Consider which positions are most painful for you when choosing a resting position. If you have pain with flexion-based activities (sitting, bending, stooping, squatting), choose a position that allows you to rest in a less flexed posture. You would want to avoid curling into a fetal position on your side. If your pain worsens with extension-based activities (prolonged standing, working overhead), avoid resting in an extended position such as sleeping on your stomach. Most people will find more comfort when they rest with their spine in a more neutral position, neither too rounded nor too arched. Lying on a non-sagging bed on your side with a pillow between your knees, or on your back with a pillow under your knees will often provide some relief.  Keep in mind, being in any one position for a prolonged period of time, no matter how correct your posture, can still lead to stiffness. WALKING Walk with an upright posture. Your ears, shoulders, and hips should all line up. OFFICE WORK When working at a desk, create an environment that supports good, upright posture. Without extra support, muscles fatigue and lead to excessive strain on joints and other tissues. CHAIR:  A chair should be able to slide under your desk when your back makes contact with the back of the chair. This allows you to work closely.  The chair's height should allow your eyes to be level with the upper part of your monitor and your hands to be slightly lower than your elbows.  Body position:  Your feet should make contact with the floor. If this is not possible, use a foot rest.  Keep your ears over your shoulders. This will reduce stress on your neck and low back.   This information is not intended to replace advice given to you by  your health care provider. Make sure you discuss any questions you have with your health care provider.   Document Released: 02/07/2005 Document Revised: 02/28/2014 Document Reviewed: 05/22/2008 [employer] Interactive Patient Education 2016 [employer]  Impingement Syndrome, Rotator Cuff, Bursitis With Rehab Impingement syndrome is a condition that involves inflammation of the tendons of the rotator cuff and the subacromial bursa, that causes pain in the shoulder. The rotator cuff consists of four tendons and muscles that control much of the shoulder and upper arm function. The subacromial bursa is a fluid filled sac that helps reduce friction between the rotator cuff and one of the bones of the shoulder (acromion). Impingement syndrome is usually an overuse injury that causes swelling of the bursa (bursitis), swelling of the tendon (tendonitis), and/or a tear of the tendon (strain). Strains are classified into three categories. Grade 1  strains cause pain, but the tendon is not lengthened. Grade 2 strains include a lengthened ligament, due to the ligament being stretched or partially ruptured. With grade 2 strains there is still function, although the function may be decreased. Grade 3 strains include a complete tear of the tendon or muscle, and function is usually impaired. SYMPTOMS   Pain around the shoulder, often at the outer portion of the upper arm.  Pain that gets worse with shoulder function, especially when reaching overhead or lifting.  Sometimes, aching when not using the arm.  Pain that wakes you up at night.  Sometimes, tenderness, swelling, warmth, or redness over the affected area.  Loss of strength.  Limited motion of the shoulder, especially reaching behind the back (to the back pocket or to unhook bra) or across your body.  Crackling sound (crepitation) when moving the arm.  Biceps tendon pain and inflammation (in the front of the shoulder). Worse when bending the elbow or lifting. CAUSES  Impingement syndrome is often an overuse injury, in which chronic (repetitive) motions cause the tendons or bursa to become inflamed. A strain occurs when a force is paced on the tendon or muscle that is greater than it can withstand. Common mechanisms of injury include: Stress from sudden increase in duration, frequency, or intensity of training.  Direct hit (trauma) to the shoulder.  Aging, erosion of the tendon with normal use.  Bony bump on shoulder (acromial spur). RISK INCREASES WITH:  Contact sports (football, wrestling, boxing).  Throwing sports (baseball, tennis, volleyball).  Weightlifting and bodybuilding.  Heavy labor.  Previous injury to the rotator cuff, including impingement.  Poor shoulder strength and flexibility.  Failure to warm up properly before activity.  Inadequate protective equipment.  Old age.  Bony bump on shoulder (acromial spur). PREVENTION   Warm up and stretch  properly before activity.  Allow for adequate recovery between workouts.  Maintain physical fitness:  Strength, flexibility, and endurance.  Cardiovascular fitness.  Learn and use proper exercise technique. PROGNOSIS  If treated properly, impingement syndrome usually goes away within 6 weeks. Sometimes surgery is required.  RELATED COMPLICATIONS   Longer healing time if not properly treated, or if not given enough time to heal.  Recurring symptoms, that result in a chronic condition.  Shoulder stiffness, frozen shoulder, or loss of motion.  Rotator cuff tendon tear.  Recurring symptoms, especially if activity is resumed too soon, with overuse, with a direct blow, or when using poor technique. TREATMENT  Treatment first involves the use of ice and medicine, to reduce pain and inflammation. The use of strengthening and stretching exercises may help reduce pain with  activity. These exercises may be performed at home or with a therapist. If non-surgical treatment is unsuccessful after more than 6 months, surgery may be advised. After surgery and rehabilitation, activity is usually possible in 3 months.  MEDICATION  If pain medicine is needed, nonsteroidal anti-inflammatory medicines (aspirin and ibuprofen), or other minor pain relievers (acetaminophen), are often advised.  Do not take pain medicine for 7 days before surgery.  Prescription pain relievers may be given, if your caregiver thinks they are needed. Use only as directed and only as much as you need.  Corticosteroid injections may be given by your caregiver. These injections should be reserved for the most serious cases, because they may only be given a certain number of times. HEAT AND COLD  Cold treatment (icing) should be applied for 10 to 15 minutes every 2 to 3 hours for inflammation and pain, and immediately after activity that aggravates your symptoms. Use ice packs or an ice massage.  Heat treatment may be used  before performing stretching and strengthening activities prescribed by your caregiver, physical therapist, or athletic trainer. Use a heat Shawn Carattini or a warm water soak. SEEK MEDICAL CARE IF:   Symptoms get worse or do not improve in 4 to 6 weeks, despite treatment.  New, unexplained symptoms develop. (Drugs used in treatment may produce side effects.) EXERCISES  RANGE OF MOTION (ROM) AND STRETCHING EXERCISES - Impingement Syndrome (Rotator Cuff  Tendinitis, Bursitis) These exercises may help you when beginning to rehabilitate your injury. Your symptoms may go away with or without further involvement from your physician, physical therapist or athletic trainer. While completing these exercises, remember:   Restoring tissue flexibility helps normal motion to return to the joints. This allows healthier, less painful movement and activity.  An effective stretch should be held for at least 30 seconds.  A stretch should never be painful. You should only feel a gentle lengthening or release in the stretched tissue. STRETCH - Flexion, Standing  Stand with good posture. With an underhand grip on your right / left hand, and an overhand grip on the opposite hand, grasp a broomstick or cane so that your hands are a little more than shoulder width apart.  Keeping your right / left elbow straight and shoulder muscles relaxed, push the stick with your opposite hand, to raise your right / left arm in front of your body and then overhead. Raise your arm until you feel a stretch in your right / left shoulder, but before you have increased shoulder pain.  Try to avoid shrugging your right / left shoulder as your arm rises, by keeping your shoulder blade tucked down and toward your mid-back spine. Hold for __________ seconds.  Slowly return to the starting position. Repeat __________ times. Complete this exercise __________ times per day. STRETCH - Abduction, Supine  Lie on your back. With an underhand grip on  your right / left hand and an overhand grip on the opposite hand, grasp a broomstick or cane so that your hands are a little more than shoulder width apart.  Keeping your right / left elbow straight and your shoulder muscles relaxed, push the stick with your opposite hand, to raise your right / left arm out to the side of your body and then overhead. Raise your arm until you feel a stretch in your right / left shoulder, but before you have increased shoulder pain.  Try to avoid shrugging your right / left shoulder as your arm rises, by keeping your shoulder  blade tucked down and toward your mid-back spine. Hold for __________ seconds.  Slowly return to the starting position. Repeat __________ times. Complete this exercise __________ times per day. ROM - Flexion, Active-Assisted  Lie on your back. You may bend your knees for comfort.  Grasp a broomstick or cane so your hands are about shoulder width apart. Your right / left hand should grip the end of the stick, so that your hand is positioned "thumbs-up," as if you were about to shake hands.  Using your healthy arm to lead, raise your right / left arm overhead, until you feel a gentle stretch in your shoulder. Hold for __________ seconds.  Use the stick to assist in returning your right / left arm to its starting position. Repeat __________ times. Complete this exercise __________ times per day.  ROM - Internal Rotation, Supine   Lie on your back on a firm surface. Place your right / left elbow about 60 degrees away from your side. Elevate your elbow with a folded towel, so that the elbow and shoulder are the same height.  Using a broomstick or cane and your strong arm, pull your right / left hand toward your body until you feel a gentle stretch, but no increase in your shoulder pain. Keep your shoulder and elbow in place throughout the exercise.  Hold for __________ seconds. Slowly return to the starting position. Repeat __________ times.  Complete this exercise __________ times per day. STRETCH - Internal Rotation  Place your right / left hand behind your back, palm up.  Throw a towel or belt over your opposite shoulder. Grasp the towel with your right / left hand.  While keeping an upright posture, gently pull up on the towel, until you feel a stretch in the front of your right / left shoulder.  Avoid shrugging your right / left shoulder as your arm rises, by keeping your shoulder blade tucked down and toward your mid-back spine.  Hold for __________ seconds. Release the stretch, by lowering your healthy hand. Repeat __________ times. Complete this exercise __________ times per day. ROM - Internal Rotation   Using an underhand grip, grasp a stick behind your back with both hands.  While standing upright with good posture, slide the stick up your back until you feel a mild stretch in the front of your shoulder.  Hold for __________ seconds. Slowly return to your starting position. Repeat __________ times. Complete this exercise __________ times per day.  STRETCH - Posterior Shoulder Capsule   Stand or sit with good posture. Grasp your right / left elbow and draw it across your chest, keeping it at the same height as your shoulder.  Pull your elbow, so your upper arm comes in closer to your chest. Pull until you feel a gentle stretch in the back of your shoulder.  Hold for __________ seconds. Repeat __________ times. Complete this exercise __________ times per day. STRENGTHENING EXERCISES - Impingement Syndrome (Rotator Cuff Tendinitis, Bursitis) These exercises may help you when beginning to rehabilitate your injury. They may resolve your symptoms with or without further involvement from your physician, physical therapist or athletic trainer. While completing these exercises, remember:  Muscles can gain both the endurance and the strength needed for everyday activities through controlled exercises.  Complete these  exercises as instructed by your physician, physical therapist or athletic trainer. Increase the resistance and repetitions only as guided.  You may experience muscle soreness or fatigue, but the pain or discomfort you are trying to  eliminate should never worsen during these exercises. If this pain does get worse, stop and make sure you are following the directions exactly. If the pain is still present after adjustments, discontinue the exercise until you can discuss the trouble with your clinician.  During your recovery, avoid activity or exercises which involve actions that place your injured hand or elbow above your head or behind your back or head. These positions stress the tissues which you are trying to heal. STRENGTH - Scapular Depression and Adduction   With good posture, sit on a firm chair. Support your arms in front of you, with pillows, arm rests, or on a table top. Have your elbows in line with the sides of your body.  Gently draw your shoulder blades down and toward your mid-back spine. Gradually increase the tension, without tensing the muscles along the top of your shoulders and the back of your neck.  Hold for __________ seconds. Slowly release the tension and relax your muscles completely before starting the next repetition.  After you have practiced this exercise, remove the arm support and complete the exercise in standing as well as sitting position. Repeat __________ times. Complete this exercise __________ times per day.  STRENGTH - Shoulder Abductors, Isometric  With good posture, stand or sit about 4-6 inches from a wall, with your right / left side facing the wall.  Bend your right / left elbow. Gently press your right / left elbow into the wall. Increase the pressure gradually, until you are pressing as hard as you can, without shrugging your shoulder or increasing any shoulder discomfort.  Hold for __________ seconds.  Release the tension slowly. Relax your shoulder  muscles completely before you begin the next repetition. Repeat __________ times. Complete this exercise __________ times per day.  STRENGTH - External Rotators, Isometric  Keep your right / left elbow at your side and bend it 90 degrees.  Step into a door frame so that the outside of your right / left wrist can press against the door frame without your upper arm leaving your side.  Gently press your right / left wrist into the door frame, as if you were trying to swing the back of your hand away from your stomach. Gradually increase the tension, until you are pressing as hard as you can, without shrugging your shoulder or increasing any shoulder discomfort.  Hold for __________ seconds.  Release the tension slowly. Relax your shoulder muscles completely before you begin the next repetition. Repeat __________ times. Complete this exercise __________ times per day.  STRENGTH - Supraspinatus   Stand or sit with good posture. Grasp a __________ weight, or an exercise band or tubing, so that your hand is "thumbs-up," like you are shaking hands.  Slowly lift your right / left arm in a "V" away from your thigh, diagonally into the space between your side and straight ahead. Lift your hand to shoulder height or as far as you can, without increasing any shoulder pain. At first, many people do not lift their hands above shoulder height.  Avoid shrugging your right / left shoulder as your arm rises, by keeping your shoulder blade tucked down and toward your mid-back spine.  Hold for __________ seconds. Control the descent of your hand, as you slowly return to your starting position. Repeat __________ times. Complete this exercise __________ times per day.  STRENGTH - External Rotators  Secure a rubber exercise band or tubing to a fixed object (table, pole) so that it is  at the same height as your right / left elbow when you are standing or sitting on a firm surface.  Stand or sit so that the  secured exercise band is at your uninjured side.  Bend your right / left elbow 90 degrees. Place a folded towel or small pillow under your right / left arm, so that your elbow is a few inches away from your side.  Keeping the tension on the exercise band, pull it away from your body, as if pivoting on your elbow. Be sure to keep your body steady, so that the movement is coming only from your rotating shoulder.  Hold for __________ seconds. Release the tension in a controlled manner, as you return to the starting position. Repeat __________ times. Complete this exercise __________ times per day.  STRENGTH - Internal Rotators   Secure a rubber exercise band or tubing to a fixed object (table, pole) so that it is at the same height as your right / left elbow when you are standing or sitting on a firm surface.  Stand or sit so that the secured exercise band is at your right / left side.  Bend your elbow 90 degrees. Place a folded towel or small pillow under your right / left arm so that your elbow is a few inches away from your side.  Keeping the tension on the exercise band, pull it across your body, toward your stomach. Be sure to keep your body steady, so that the movement is coming only from your rotating shoulder.  Hold for __________ seconds. Release the tension in a controlled manner, as you return to the starting position. Repeat __________ times. Complete this exercise __________ times per day.  STRENGTH - Scapular Protractors, Standing   Stand arms length away from a wall. Place your hands on the wall, keeping your elbows straight.  Begin by dropping your shoulder blades down and toward your mid-back spine.  To strengthen your protractors, keep your shoulder blades down, but slide them forward on your rib cage. It will feel as if you are lifting the back of your rib cage away from the wall. This is a subtle motion and can be challenging to complete. Ask your caregiver for further  instruction, if you are not sure you are doing the exercise correctly.  Hold for __________ seconds. Slowly return to the starting position, resting the muscles completely before starting the next repetition. Repeat __________ times. Complete this exercise __________ times per day. STRENGTH - Scapular Protractors, Supine  Lie on your back on a firm surface. Extend your right / left arm straight into the air while holding a __________ weight in your hand.  Keeping your head and back in place, lift your shoulder off the floor.  Hold for __________ seconds. Slowly return to the starting position, and allow your muscles to relax completely before starting the next repetition. Repeat __________ times. Complete this exercise __________ times per day. STRENGTH - Scapular Protractors, Quadruped  Get onto your hands and knees, with your shoulders directly over your hands (or as close as you can be, comfortably).  Keeping your elbows locked, lift the back of your rib cage up into your shoulder blades, so your mid-back rounds out. Keep your neck muscles relaxed.  Hold this position for __________ seconds. Slowly return to the starting position and allow your muscles to relax completely before starting the next repetition. Repeat __________ times. Complete this exercise __________ times per day.  STRENGTH - Scapular Retractors  Secure  a rubber exercise band or tubing to a fixed object (table, pole), so that it is at the height of your shoulders when you are either standing, or sitting on a firm armless chair.  With a palm down grip, grasp an end of the band in each hand. Straighten your elbows and lift your hands straight in front of you, at shoulder height. Step back, away from the secured end of the band, until it becomes tense.  Squeezing your shoulder blades together, draw your elbows back toward your sides, as you bend them. Keep your upper arms lifted away from your body throughout the  exercise.  Hold for __________ seconds. Slowly ease the tension on the band, as you reverse the directions and return to the starting position. Repeat __________ times. Complete this exercise __________ times per day. STRENGTH - Shoulder Extensors   Secure a rubber exercise band or tubing to a fixed object (table, pole) so that it is at the height of your shoulders when you are either standing, or sitting on a firm armless chair.  With a thumbs-up grip, grasp an end of the band in each hand. Straighten your elbows and lift your hands straight in front of you, at shoulder height. Step back, away from the secured end of the band, until it becomes tense.  Squeezing your shoulder blades together, pull your hands down to the sides of your thighs. Do not allow your hands to go behind you.  Hold for __________ seconds. Slowly ease the tension on the band, as you reverse the directions and return to the starting position. Repeat __________ times. Complete this exercise __________ times per day.  STRENGTH - Scapular Retractors and External Rotators   Secure a rubber exercise band or tubing to a fixed object (table, pole) so that it is at the height as your shoulders, when you are either standing, or sitting on a firm armless chair.  With a palm down grip, grasp an end of the band in each hand. Bend your elbows 90 degrees and lift your elbows to shoulder height, at your sides. Step back, away from the secured end of the band, until it becomes tense.  Squeezing your shoulder blades together, rotate your shoulders so that your upper arms and elbows remain stationary, but your fists travel upward to head height.  Hold for __________ seconds. Slowly ease the tension on the band, as you reverse the directions and return to the starting position. Repeat __________ times. Complete this exercise __________ times per day.  STRENGTH - Scapular Retractors and External Rotators, Rowing   Secure a rubber exercise  band or tubing to a fixed object (table, pole) so that it is at the height of your shoulders, when you are either standing, or sitting on a firm armless chair.  With a palm down grip, grasp an end of the band in each hand. Straighten your elbows and lift your hands straight in front of you, at shoulder height. Step back, away from the secured end of the band, until it becomes tense.  Step 1: Squeeze your shoulder blades together. Bending your elbows, draw your hands to your chest, as if you are rowing a boat. At the end of this motion, your hands and elbow should be at shoulder height and your elbows should be out to your sides.  Step 2: Rotate your shoulders, to raise your hands above your head. Your forearms should be vertical and your upper arms should be horizontal.  Hold for __________ seconds.  Slowly ease the tension on the band, as you reverse the directions and return to the starting position. Repeat __________ times. Complete this exercise __________ times per day.  STRENGTH - Scapular Depressors  Find a sturdy chair without wheels, such as a dining room chair.  Keeping your feet on the floor, and your hands on the chair arms, lift your bottom up from the seat, and lock your elbows.  Keeping your elbows straight, allow gravity to pull your body weight down. Your shoulders will rise toward your ears.  Raise your body against gravity by drawing your shoulder blades down your back, shortening the distance between your shoulders and ears. Although your feet should always maintain contact with the floor, your feet should progressively support less body weight, as you get stronger.  Hold for __________ seconds. In a controlled and slow manner, lower your body weight to begin the next repetition. Repeat __________ times. Complete this exercise __________ times per day.    This information is not intended to replace advice given to you by your health care provider. Make sure you discuss any  questions you have with your health care provider.   Document Released: 02/07/2005 Document Revised: 02/28/2014 Document Reviewed: 05/22/2008 Elsevier Interactive Patient Education [employer].

## 2015-12-01 ENCOUNTER — Encounter: Payer: Self-pay | Admitting: Cardiovascular Disease

## 2015-12-01 ENCOUNTER — Ambulatory Visit (INDEPENDENT_AMBULATORY_CARE_PROVIDER_SITE_OTHER): Payer: BLUE CROSS/BLUE SHIELD | Admitting: Cardiovascular Disease

## 2015-12-01 DIAGNOSIS — I2510 Atherosclerotic heart disease of native coronary artery without angina pectoris: Secondary | ICD-10-CM | POA: Diagnosis not present

## 2015-12-01 DIAGNOSIS — I10 Essential (primary) hypertension: Secondary | ICD-10-CM | POA: Diagnosis not present

## 2015-12-01 DIAGNOSIS — E785 Hyperlipidemia, unspecified: Secondary | ICD-10-CM

## 2015-12-01 NOTE — Assessment & Plan Note (Signed)
History of hyperlipidemia on statin therapy with recent lipid profile performed 06/14/68 related to cholesterol 52, LDL 65 HDL 73.

## 2015-12-01 NOTE — Assessment & Plan Note (Signed)
History of noncritical CAD by cardiac catheterization performed by Dr. Ellyn Hack 07/19/11. The patient denies chest pain or shortness of breath.

## 2015-12-01 NOTE — Progress Notes (Signed)
12/01/2015 Frederick Rush   Apr 21, 1950  [id number]  Primary Physician Frederick Forts, MD Primary Cardiologist: Frederick Harp MD Frederick Rush, [place]  HPI:  The patient is a 65 year old mildly overweight, married, Caucasian male, father of two, grandfather to two grandchildren, who I last saw in the office 11/28/14 He has a history of noncritical CAD found on cath performed by Dr. Ula Rush on Jul 15, 2011. His other problems include hyperlipidemia and remote tobacco abuse. He denies chest pain or shortness of breath.   Current Outpatient Prescriptions  Medication Sig Dispense Refill  . aspirin 81 MG tablet Take 81 mg by mouth daily.    . fluticasone (FLONASE) 50 MCG/ACT nasal spray Place into both nostrils daily.    Marland Kitchen lisinopril (PRINIVIL,ZESTRIL) 20 MG tablet Take 1 tablet (20 mg total) by mouth daily. 90 tablet 3  . meloxicam (MOBIC) 15 MG tablet Take 1 tablet (15 mg total) by mouth daily. 90 tablet 3  . mesalamine (APRISO) 0.375 G 24 hr capsule Take 1,500 mg by mouth every morning. 1553m=4 capsules    . nitroGLYCERIN (NITROSTAT) 0.4 MG SL tablet Place 1 tablet (0.4 mg total) under the tongue every 5 (five) minutes x 3 doses as needed for chest pain. 25 tablet 4  . rosuvastatin (CRESTOR) 40 MG tablet TAKE ONE TABLET BY MOUTH ONCE DAILY 30 tablet 11   No current facility-administered medications for this visit.     No Known Allergies  Social History   Social History  . Marital status: Married    Spouse name: Frederick Rush . Number of children: 2  . 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   Social History Main Topics  . Smoking status: Former Smoker    Quit date: 07/16/1998  . Smokeless tobacco: Never Used  . Alcohol use 4.8 oz/week    8 Cans of beer per week  . Drug use: No  . Sexual activity: Yes    Partners: Female    Birth control/ protection: None   Other Topics Concern  . 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      Lives: Lives with his wife and their pets.      Tobacco:  None; quit in 2000; smoked x 15 years + 20 years = 35 years.      Alcohol:    2-3 beers per day      Exercise: job physically demanding; mows yard.      Seatbelt: 100%; no texting        Review of Systems: General: negative for chills, fever, night sweats or weight changes.  Cardiovascular: negative for chest pain, dyspnea on exertion, edema, orthopnea, palpitations, paroxysmal nocturnal dyspnea or shortness of breath Dermatological: negative for rash Respiratory: negative for cough or wheezing Urologic: negative for hematuria Abdominal: negative for nausea, vomiting, diarrhea, bright red blood per rectum, melena, or hematemesis Neurologic: negative for visual changes, syncope, or dizziness All other systems reviewed and are otherwise negative except as noted above.    Blood pressure 140/86, pulse (!) 57, height 5' 8"  (1.727 m), weight 174 lb (78.9 kg).  General appearance: alert and no distress Neck: no adenopathy, no carotid bruit, no JVD,  supple, symmetrical, trachea midline and thyroid not enlarged, symmetric, no tenderness/mass/nodules Lungs: clear to auscultation bilaterally Heart: regular rate and rhythm, S1, S2 normal, no murmur, click, rub or gallop Extremities: extremities normal, atraumatic, no cyanosis or edema  EKG sinus bradycardia 57 without ST or T-wave changes. I personally reviewed this EKG  ASSESSMENT AND PLAN:   Dyslipidemia History of hyperlipidemia on statin therapy with recent lipid profile performed 06/14/68 related to cholesterol 52, LDL 65 HDL 73.  CAD (coronary artery disease), non obstructive by cath 07/19/11, treat medically History of noncritical CAD by cardiac catheterization performed by Dr. Ellyn Rush 07/19/11. The  patient denies chest pain or shortness of breath.  Essential hypertension History of hypertension blood pressure measured at 140/86. He is on lisinopril. Continue current meds at current dosing      Frederick Harp MD [facility], [facility] 12/01/2015 2:52 PM

## 2015-12-01 NOTE — Assessment & Plan Note (Signed)
History of hypertension blood pressure measured at 140/86. He is on lisinopril. Continue current meds at current dosing

## 2015-12-01 NOTE — Patient Instructions (Signed)
Medication Instructions:  NO CHANGES.   Follow-Up: Your physician wants you to follow-up in: 12 MONTHS WITH DR BERRY.  You will receive a reminder letter in the mail two months in advance. If you don't receive a letter, please call our office to schedule the follow-up appointment.   If you need a refill on your cardiac medications before your next appointment, please call your pharmacy.   

## 2016-01-02 ENCOUNTER — Other Ambulatory Visit: Payer: Self-pay | Admitting: Cardiovascular Disease

## 2016-01-10 ENCOUNTER — Other Ambulatory Visit: Payer: Self-pay | Admitting: Cardiovascular Disease

## 2016-01-15 ENCOUNTER — Ambulatory Visit (INDEPENDENT_AMBULATORY_CARE_PROVIDER_SITE_OTHER): Payer: BLUE CROSS/BLUE SHIELD | Admitting: Family Medicine

## 2016-01-15 VITALS — BP 124/80 | HR 65 | Temp 98.3°F | Resp 18 | Ht 68.0 in | Wt 176.0 lb

## 2016-01-15 DIAGNOSIS — J40 Bronchitis, not specified as acute or chronic: Secondary | ICD-10-CM | POA: Diagnosis not present

## 2016-01-15 DIAGNOSIS — J329 Chronic sinusitis, unspecified: Secondary | ICD-10-CM | POA: Diagnosis not present

## 2016-01-15 DIAGNOSIS — R05 Cough: Secondary | ICD-10-CM

## 2016-01-15 DIAGNOSIS — R062 Wheezing: Secondary | ICD-10-CM | POA: Diagnosis not present

## 2016-01-15 DIAGNOSIS — J22 Unspecified acute lower respiratory infection: Secondary | ICD-10-CM

## 2016-01-15 DIAGNOSIS — R059 Cough, unspecified: Secondary | ICD-10-CM

## 2016-01-15 MED ORDER — CLARITHROMYCIN 500 MG PO TABS: 500.0000 mg | ORAL_TABLET | Freq: Two times a day (BID) | ORAL | 0 refills | Status: DC

## 2016-01-15 NOTE — Patient Instructions (Addendum)
Mucinex over-the-counter as needed for cough. As we discussed, your symptoms are likely due to a virus at this time, but if the cough is not improving with Mucinex as we discussed, I did prescribe the Biaxin that would help lower respiratory infections or sinus infection. Saline nasal spray and Mucinex for now. Albuterol if needed for wheezing, but if you require that medication more than 2-3 times per day, or persistently need that medication frequently in the next 3 days, return for recheck as you may need prednisone again.  Return to the clinic or go to the nearest emergency room if any of your symptoms worsen or new symptoms occur.   Acute Bronchitis, Adult Acute bronchitis is when air tubes (bronchi) in the lungs suddenly get swollen. The condition can make it hard to breathe. It can also cause these symptoms:  A cough.  Coughing up clear, yellow, or green mucus.  Wheezing.  Chest congestion.  Shortness of breath.  A fever.  Body aches.  Chills.  A sore throat. Follow these instructions at home: Medicines  Take over-the-counter and prescription medicines only as told by your doctor.  If you were prescribed an antibiotic medicine, take it as told by your doctor. Do not stop taking the antibiotic even if you start to feel better. General instructions  Rest.  Drink enough fluids to keep your pee (urine) clear or pale yellow.  Avoid smoking and secondhand smoke. If you smoke and you need help quitting, ask your doctor. Quitting will help your lungs heal faster.  Use an inhaler, cool mist vaporizer, or humidifier as told by your doctor.  Keep all follow-up visits as told by your doctor. This is important. How is this prevented? To lower your risk of getting this condition again:  Wash your hands often with soap and water. If you cannot use soap and water, use hand sanitizer.  Avoid contact with people who have cold symptoms.  Try not to touch your hands to your  mouth, nose, or eyes.  Make sure to get the flu shot every year. Contact a doctor if:  Your symptoms do not get better in 2 weeks. Get help right away if:  You cough up blood.  You have chest pain.  You have very bad shortness of breath.  You become dehydrated.  You faint (pass out) or keep feeling like you are going to pass out.  You keep throwing up (vomiting).  You have a very bad headache.  Your fever or chills gets worse. This information is not intended to replace advice given to you by your health care provider. Make sure you discuss any questions you have with your health care provider. Document Released: 07/27/2007 Document Revised: 09/16/2015 Document Reviewed: 07/29/2015 Elsevier Interactive Patient Education  2017 [employer].   IF you received an x-ray today, you will receive an invoice from [facility]. Please contact [facility] at [phone] with questions or concerns regarding your invoice.   IF you received labwork today, you will receive an invoice from [employer]. Please contact [facility] at [phone] with questions or concerns regarding your invoice.   Our billing staff will not be able to assist you with questions regarding bills from these companies.  You will be contacted with the lab results as soon as they are available. The fastest way to get your results is to activate your My Chart account. Instructions are located on the last page of this paperwork. If you have not heard from [place]  regarding the results in 2 weeks, please contact this office.

## 2016-01-15 NOTE — Progress Notes (Signed)
Subjective:    Patient ID: Frederick Rush, male    DOB: 04/22/1950, 65 y.o.   MRN: [id number]  HPI Frederick Rush is a 65 y.o. male  Started with dry cough 3 days ago.  burning in chest with cough, sore throat noted yesterday morning. More discolored mucus yesterday afternoon, more productive of green mucus today.  No fever. Some cold spells. Slight shortness of breath with exertion yesterday afternoon. Cold air hurt to breathe, but no other chest pains.  Hx of pneumonia.   No known sick contacts. No attempted treatments.   Quit smoking 16 years ago, 20 pack year hx.   Prior note from Dr. Tamala Julian reviewed  Patient Active Problem List   Diagnosis Date Noted  . Osteoarthritis of both hands 10/07/2015  . Essential hypertension 04/24/2014  . CAD (coronary artery disease), non obstructive by cath 07/19/11, treat medically 07/19/2011  . Unstable angina ([place]) 07/16/2011  . Dyslipidemia 07/16/2011  . History of smoking 07/16/2011  . Family history of coronary artery bypass surgery 07/16/2011  . Crohn disease ([place]) 07/16/2011  . Peptic ulcer disease, endo 2011 07/16/2011   Past Medical History:  Diagnosis Date  . Angina   . Arthritis   . CAD (coronary artery disease), non obstructive by cath 07/19/11, treat medically 07/19/2011  . Crohn disease ([place])   . Hypercholesterolemia   . Hyperlipidemia   . Myocardial infarction   . Pneumonia   . Skin cancer 2006   Left shoulder; followed every six months.     Past Surgical History:  Procedure Laterality Date  . APPENDECTOMY    . CARDIAC CATHETERIZATION  07/15/2011   Medical management  . HERNIA REPAIR    . KNEE SURGERY     arthroscopy  . LEFT HEART CATHETERIZATION WITH CORONARY ANGIOGRAM  07/19/2011   Procedure: LEFT HEART CATHETERIZATION WITH CORONARY ANGIOGRAM;  Surgeon: Leonie Man, MD;  Location: [facility] CATH LAB;  Service: Cardiovascular;;  . TONSILLECTOMY     No Known Allergies Prior to Admission medications   Medication Sig  Start Date End Date Taking? Authorizing Provider  aspirin 81 MG tablet Take 81 mg by mouth daily.   Yes Historical Provider, MD  fluticasone (FLONASE) 50 MCG/ACT nasal spray Place into both nostrils daily.   Yes Historical Provider, MD  lisinopril (PRINIVIL,ZESTRIL) 20 MG tablet Take 1 tablet (20 mg total) by mouth daily. 06/15/15  Yes Wardell Honour, MD  meloxicam (MOBIC) 15 MG tablet Take 1 tablet (15 mg total) by mouth daily. 06/15/15  Yes Wardell Honour, MD  mesalamine (APRISO) 0.375 G 24 hr capsule Take 1,500 mg by mouth every morning. 1555m=4 capsules   Yes Historical Provider, MD  nitroGLYCERIN (NITROSTAT) 0.4 MG SL tablet Place 1 tablet (0.4 mg total) under the tongue every 5 (five) minutes x 3 doses as needed for chest pain. 11/19/12  Yes JLorretta Harp MD  rosuvastatin (CRESTOR) 40 MG tablet TAKE ONE TABLET BY MOUTH ONCE DAILY 01/04/16  Yes JLorretta Harp MD  rosuvastatin (CRESTOR) 40 MG tablet TAKE ONE TABLET BY MOUTH ONCE DAILY Patient not taking: Reported on 01/15/2016 01/11/16   JLorretta Harp MD   Social History   Social History  . Marital status: Married    Spouse name: [name] . Number of children: 2  . 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   Social History Main Topics  . Smoking status: Former Smoker  Quit date: 07/16/1998  . Smokeless tobacco: Never Used  . Alcohol use 4.8 oz/week    8 Cans of beer per week  . Drug use: No  . Sexual activity: Yes    Partners: Female    Birth control/ protection: None   Other Topics Concern  . 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      Lives: Lives with his wife and their pets.      Tobacco:  None; quit in 2000; smoked x 15 years + 20 years = 35 years.      Alcohol:    2-3 beers per day       Exercise: job physically demanding; mows yard.      Seatbelt: 100%; no texting         Review of Systems  Constitutional: Positive for appetite change and chills. Negative for diaphoresis and fever.  HENT: Positive for congestion and sore throat. Negative for sinus pressure.   Respiratory: Positive for cough, shortness of breath (with exertion only. not at rest. ) and wheezing (used inhaler yesterday afternoon - 2 times then, once this am. ).   Cardiovascular: Negative for chest pain, palpitations and leg swelling.       Objective:   Physical Exam  Constitutional: He is oriented to person, place, and time. He appears well-developed and well-nourished.  HENT:  Head: Normocephalic and atraumatic.  Right Ear: Tympanic membrane, external ear and ear canal normal.  Left Ear: Tympanic membrane, external ear and ear canal normal.  Nose: No rhinorrhea.  Mouth/Throat: Oropharynx is clear and moist and mucous membranes are normal. No oropharyngeal exudate or posterior oropharyngeal erythema.  Right frontal sinus tenderness. No lymphadenopathy, other Sinuses nontender  Eyes: Conjunctivae are normal. Pupils are equal, round, and reactive to light.  Neck: Neck supple.  Cardiovascular: Normal rate, regular rhythm, normal heart sounds and intact distal pulses.   No murmur heard. Pulmonary/Chest: Effort normal and breath sounds normal. He has no wheezes. He has no rhonchi. He has no rales.  Abdominal: Soft. There is no tenderness.  Lymphadenopathy:    He has no cervical adenopathy.  Neurological: He is alert and oriented to person, place, and time.  Skin: Skin is warm and dry. No rash noted.  Psychiatric: He has a normal mood and affect. His behavior is normal.  Vitals reviewed.   Vitals:   01/15/16 1604  BP: 124/80  Pulse: 65  Resp: 18  Temp: 98.3 F (36.8 C)  TempSrc: Oral  SpO2: 98%  Weight: 176 lb (79.8 kg)  Height: 5' 8"  (1.727 m)      Assessment & Plan:    Frederick Rush  is a 65 y.o. male Cough - Plan: clarithromycin (BIAXIN) 500 MG tablet  LRTI (lower respiratory tract infection) - Plan: clarithromycin (BIAXIN) 500 MG tablet  Sinobronchitis - Plan: clarithromycin (BIAXIN) 500 MG tablet  Wheezing - Plan: clarithromycin (BIAXIN) 500 MG tablet  Suspected viral infection, but with change in mucus color, history of pneumonia by report, antibiotic prescribed if not improving with symptomatic care in the next few days. Continue albuterol if needed for wheezing, RTC precautions if persistent or frequent use as may need albuterol again as required last visit. ER/RTC precautions.  Meds ordered this encounter  Medications  . clarithromycin (BIAXIN) 500 MG tablet    Sig: Take 1 tablet (500 mg total) by mouth 2 (two) times daily.    Dispense:  20 tablet    Refill:  0   Patient Instructions    Mucinex over-the-counter as needed for cough. As we discussed, your symptoms are likely due to a virus at this time, but if the cough is not improving with Mucinex as we discussed, I did prescribe the Biaxin that would help lower respiratory infections or sinus infection. Saline nasal spray and Mucinex for now. Albuterol if needed for wheezing, but if you require that medication more than 2-3 times per day, or persistently need that medication frequently in the next 3 days, return for recheck as you may need prednisone again.  Return to the clinic or go to the nearest emergency room if any of your symptoms worsen or new symptoms occur.   Acute Bronchitis, Adult Acute bronchitis is when air tubes (bronchi) in the lungs suddenly get swollen. The condition can make it hard to breathe. It can also cause these symptoms:  A cough.  Coughing up clear, yellow, or green mucus.  Wheezing.  Chest congestion.  Shortness of breath.  A fever.  Body aches.  Chills.  A sore throat. Follow these instructions at home: Medicines  Take over-the-counter and prescription  medicines only as told by your doctor.  If you were prescribed an antibiotic medicine, take it as told by your doctor. Do not stop taking the antibiotic even if you start to feel better. General instructions  Rest.  Drink enough fluids to keep your pee (urine) clear or pale yellow.  Avoid smoking and secondhand smoke. If you smoke and you need help quitting, ask your doctor. Quitting will help your lungs heal faster.  Use an inhaler, cool mist vaporizer, or humidifier as told by your doctor.  Keep all follow-up visits as told by your doctor. This is important. How is this prevented? To lower your risk of getting this condition again:  Wash your hands often with soap and water. If you cannot use soap and water, use hand sanitizer.  Avoid contact with people who have cold symptoms.  Try not to touch your hands to your mouth, nose, or eyes.  Make sure to get the flu shot every year. Contact a doctor if:  Your symptoms do not get better in 2 weeks. Get help right away if:  You cough up blood.  You have chest pain.  You have very bad shortness of breath.  You become dehydrated.  You faint (pass out) or keep feeling like you are going to pass out.  You keep throwing up (vomiting).  You have a very bad headache.  Your fever or chills gets worse. This information is not intended to replace advice given to you by your health care provider. Make sure you discuss any questions you have with your health care provider. Document Released: 07/27/2007 Document Revised: 09/16/2015 Document Reviewed: 07/29/2015 Elsevier Interactive Patient Education  2017 [employer].   IF you received an x-ray today, you will receive an invoice from [facility] Rad[facility]. Please contact [facility] at [phone] with questions or concerns regarding your invoice.   IF you received labwork today, you will receive an invoice from [employer]. Please contact  [facility] at [phone] with questions or concerns regarding your invoice.   Our billing staff will not be able to assist you with questions regarding bills from these companies.  You will be contacted with the lab results as soon as they are available. The fastest way to get your results is to activate your My Chart account. Instructions are located on the last page of this paperwork. If you have not heard from [place] regarding the results in 2 weeks, please contact this office.       Signed,   Merri Ray, MD [facility].  01/15/16 4:50 PM

## 2016-02-23 ENCOUNTER — Ambulatory Visit: Payer: BLUE CROSS/BLUE SHIELD

## 2016-02-23 ENCOUNTER — Ambulatory Visit (INDEPENDENT_AMBULATORY_CARE_PROVIDER_SITE_OTHER): Payer: BLUE CROSS/BLUE SHIELD | Admitting: Family Medicine

## 2016-02-23 ENCOUNTER — Encounter: Payer: Self-pay | Admitting: Family Medicine

## 2016-02-23 VITALS — BP 138/82 | HR 67 | Temp 98.1°F | Resp 16 | Ht 66.5 in | Wt 176.2 lb

## 2016-02-23 DIAGNOSIS — J0100 Acute maxillary sinusitis, unspecified: Secondary | ICD-10-CM | POA: Diagnosis not present

## 2016-02-23 MED ORDER — CEFDINIR 300 MG PO CAPS: 600.0000 mg | ORAL_CAPSULE | Freq: Every day | ORAL | 0 refills | Status: DC

## 2016-02-23 NOTE — Patient Instructions (Addendum)
Nasal saline --- 2 sprays into each three times daily. Afrin nasal spray --- 1-2 sprays into each nostril twice daily for one week only.   Sinusitis, Adult Sinusitis is soreness and inflammation of your sinuses. Sinuses are hollow spaces in the bones around your face. Your sinuses are located:  Around your eyes.  In the middle of your forehead.  Behind your nose.  In your cheekbones. Your sinuses and nasal passages are lined with a stringy fluid (mucus). Mucus normally drains out of your sinuses. When your nasal tissues become inflamed or swollen, the mucus can become trapped or blocked so air cannot flow through your sinuses. This allows bacteria, viruses, and funguses to grow, which leads to infection. Sinusitis can develop quickly and last for 7?10 days (acute) or for more than 12 weeks (chronic). Sinusitis often develops after a cold. What are the causes? This condition is caused by anything that creates swelling in the sinuses or stops mucus from draining, including:  Allergies.  Asthma.  Bacterial or viral infection.  Abnormally shaped bones between the nasal passages.  Nasal growths that contain mucus (nasal polyps).  Narrow sinus openings.  Pollutants, such as chemicals or irritants in the air.  A foreign object stuck in the nose.  A fungal infection. This is rare. What increases the risk? The following factors may make you more likely to develop this condition:  Having allergies or asthma.  Having had a recent cold or respiratory tract infection.  Having structural deformities or blockages in your nose or sinuses.  Having a weak immune system.  Doing a lot of swimming or diving.  Overusing nasal sprays.  Smoking. What are the signs or symptoms? The main symptoms of this condition are pain and a feeling of pressure around the affected sinuses. Other symptoms include:  Upper toothache.  Earache.  Headache.  Bad breath.  Decreased sense of smell  and taste.  A cough that may get worse at night.  Fatigue.  Fever.  Thick drainage from your nose. The drainage is often green and it may contain pus (purulent).  Stuffy nose or congestion.  Postnasal drip. This is when extra mucus collects in the throat or back of the nose.  Swelling and warmth over the affected sinuses.  Sore throat.  Sensitivity to light. How is this diagnosed? This condition is diagnosed based on symptoms, a medical history, and a physical exam. To find out if your condition is acute or chronic, your health care provider may:  Look in your nose for signs of nasal polyps.  Tap over the affected sinus to check for signs of infection.  View the inside of your sinuses using an imaging device that has a light attached (endoscope). If your health care provider suspects that you have chronic sinusitis, you may also:  Be tested for allergies.  Have a sample of mucus taken from your nose (nasal culture) and checked for bacteria.  Have a mucus sample examined to see if your sinusitis is related to an allergy. If your sinusitis does not respond to treatment and it lasts longer than 8 weeks, you may have an MRI or CT scan to check your sinuses. These scans also help to determine how severe your infection is. In rare cases, a bone biopsy may be done to rule out more serious types of fungal sinus disease. How is this treated? Treatment for sinusitis depends on the cause and whether your condition is chronic or acute. If a virus is causing your  sinusitis, your symptoms will go away on their own within 10 days. You may be given medicines to relieve your symptoms, including:  Topical nasal decongestants. They shrink swollen nasal passages and let mucus drain from your sinuses.  Antihistamines. These drugs block inflammation that is triggered by allergies. This can help to ease swelling in your nose and sinuses.  Topical nasal corticosteroids. These are nasal sprays that  ease inflammation and swelling in your nose and sinuses.  Nasal saline washes. These rinses can help to get rid of thick mucus in your nose. If your condition is caused by bacteria, you will be given an antibiotic medicine. If your condition is caused by a fungus, you will be given an antifungal medicine. Surgery may be needed to correct underlying conditions, such as narrow nasal passages. Surgery may also be needed to remove polyps. Follow these instructions at home: Medicines  Take, use, or apply over-the-counter and prescription medicines only as told by your health care provider. These may include nasal sprays.  If you were prescribed an antibiotic medicine, take it as told by your health care provider. Do not stop taking the antibiotic even if you start to feel better. Hydrate and Humidify  Drink enough water to keep your urine clear or pale yellow. Staying hydrated will help to thin your mucus.  Use a cool mist humidifier to keep the humidity level in your home above 50%.  Inhale steam for 10-15 minutes, 3-4 times a day or as told by your health care provider. You can do this in the bathroom while a hot shower is running.  Limit your exposure to cool or dry air. Rest  Rest as much as possible.  Sleep with your head raised (elevated).  Make sure to get enough sleep each night. General instructions  Apply a warm, moist washcloth to your face 3-4 times a day or as told by your health care provider. This will help with discomfort.  Wash your hands often with soap and water to reduce your exposure to viruses and other germs. If soap and water are not available, use hand sanitizer.  Do not smoke. Avoid being around people who are smoking (secondhand smoke).  Keep all follow-up visits as told by your health care provider. This is important. Contact a health care provider if:  You have a fever.  Your symptoms get worse.  Your symptoms do not improve within 10 days. Get help  right away if:  You have a severe headache.  You have persistent vomiting.  You have pain or swelling around your face or eyes.  You have vision problems.  You develop confusion.  Your neck is stiff.  You have trouble breathing. This information is not intended to replace advice given to you by your health care provider. Make sure you discuss any questions you have with your health care provider. Document Released: 02/07/2005 Document Revised: 10/04/2015 Document Reviewed: 12/03/2014 Elsevier Interactive Patient Education  2017 [employer].     IF you received an x-ray today, you will receive an invoice from [facility]. Please contact [facility] Radiology at [phone] with questions or concerns regarding your invoice.   IF you received labwork today, you will receive an invoice from [place]. Please contact [facility] at [phone] with questions or concerns regarding your invoice.   Our billing staff will not be able to assist you with questions regarding bills from these companies.  You will be contacted with the lab results as soon as they are available. The  fastest way to get your results is to activate your My Chart account. Instructions are located on the last page of this paperwork. If you have not heard from [place] regarding the results in 2 weeks, please contact this office.

## 2016-02-23 NOTE — Progress Notes (Signed)
Subjective:    Patient ID: Frederick Rush, male    DOB: 1950/04/11, 66 y.o.   MRN: [id number]  02/23/2016  Cough (green mucus from nose, facial pain symptoms started x 6 days)   HPI This 66 y.o. male presents for evaluation of cough, nasal congestion.  Onset one week ago.   No fever; throat is raw.  No chills/sweats.  +HA; L sided headache.  B ear congestion into glands.  +rhinorrhea.  +R nare with blood.  L side is clear and green.  Sinus pressure.  +PND.  Coughing due to PND.  No SOB.  No medication for symptoms.  Using Flonase qod.  Granddaughters visited over the holidays; they were both sick.     Review of Systems  Constitutional: Positive for fatigue. Negative for activity change, appetite change, chills, diaphoresis and fever.  HENT: Positive for congestion, postnasal drip, rhinorrhea and voice change. Negative for sore throat.   Eyes: Negative for visual disturbance.  Respiratory: Positive for cough. Negative for shortness of breath.   Cardiovascular: Negative for chest pain, palpitations and leg swelling.  Gastrointestinal: Negative for diarrhea, nausea and vomiting.  Endocrine: Negative for cold intolerance, heat intolerance, polydipsia, polyphagia and polyuria.  Neurological: Positive for headaches. Negative for dizziness, tremors, seizures, syncope, facial asymmetry, speech difficulty, weakness, light-headedness and numbness.    Past Medical History:  Diagnosis Date  . Angina   . Arthritis   . CAD (coronary artery disease), non obstructive by cath 07/19/11, treat medically 07/19/2011  . Crohn disease ([place])   . Hypercholesterolemia   . Hyperlipidemia   . Myocardial infarction   . Pneumonia   . Skin cancer 2006   Left shoulder; followed every six months.     Past Surgical History:  Procedure Laterality Date  . APPENDECTOMY    . CARDIAC CATHETERIZATION  07/15/2011   Medical management  . HERNIA REPAIR    . KNEE SURGERY     arthroscopy  . LEFT HEART CATHETERIZATION  WITH CORONARY ANGIOGRAM  07/19/2011   Procedure: LEFT HEART CATHETERIZATION WITH CORONARY ANGIOGRAM;  Surgeon: Leonie Man, MD;  Location: [facility] CATH LAB;  Service: Cardiovascular;;  . TONSILLECTOMY     No Known Allergies  Social History   Social History  . Marital status: Married    Spouse name: Shirlean Mylar  . Number of children: 2  . 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   Social History Main Topics  . Smoking status: Former Smoker    Quit date: 07/16/1998  . Smokeless tobacco: Never Used  . Alcohol use 4.8 oz/week    8 Cans of beer per week  . Drug use: No  . Sexual activity: Yes    Partners: Female    Birth control/ protection: None   Other Topics Concern  . 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      Lives: Lives with his wife and their pets.      Tobacco:  None; quit in 2000; smoked x 15 years + 20 years = 35 years.      Alcohol:    2-3 beers per day      Exercise: job physically demanding; mows yard.  Seatbelt: 100%; no texting      Family History  Problem Relation Age of Onset  . Cancer Mother     Breast cancer; leukemia  . Heart disease Mother 16    CABG       Objective:    BP 138/82 (BP Location: Left Arm, Cuff Size: Normal)   Pulse 67   Temp 98.1 F (36.7 C) (Oral)   Resp 16   Ht 5' 6.5" (1.689 m)   Wt 176 lb 3.2 oz (79.9 kg)   SpO2 99%   BMI 28.01 kg/m  Physical Exam  Constitutional: He is oriented to person, place, and time. He appears well-developed and well-nourished. No distress.  HENT:  Head: Normocephalic and atraumatic.  Right Ear: Tympanic membrane, external ear and ear canal normal.  Left Ear: Tympanic membrane, external ear and ear canal normal.  Nose: Mucosal edema and rhinorrhea present. Right sinus exhibits  maxillary sinus tenderness and frontal sinus tenderness. Left sinus exhibits no maxillary sinus tenderness and no frontal sinus tenderness.  Mouth/Throat: Oropharynx is clear and moist.  Eyes: Conjunctivae and EOM are normal. Pupils are equal, round, and reactive to light.  Neck: Normal range of motion. Neck supple. Carotid bruit is not present. No thyromegaly present.  Cardiovascular: Normal rate, regular rhythm, normal heart sounds and intact distal pulses.  Exam reveals no gallop and no friction rub.   No murmur heard. Pulmonary/Chest: Effort normal and breath sounds normal. He has no wheezes. He has no rales.  Lymphadenopathy:    He has no cervical adenopathy.  Neurological: He is alert and oriented to person, place, and time. No cranial nerve deficit.  Skin: Skin is warm and dry. No rash noted. He is not diaphoretic.  Psychiatric: He has a normal mood and affect. His behavior is normal.  Nursing note and vitals reviewed.       Assessment & Plan:   1. Acute non-recurrent maxillary sinusitis     No orders of the defined types were placed in this encounter.  Meds ordered this encounter  Medications  . cefdinir (OMNICEF) 300 MG capsule    Sig: Take 2 capsules (600 mg total) by mouth daily.    Dispense:  20 capsule    Refill:  0    Return in about 3 months (around 05/23/2016) for complete physical examiniation.   Amariya Liskey Elayne Guerin, M.D. [facility] [address] [place], [place]  [address] [phone] phone [phone] fax

## 2016-05-24 ENCOUNTER — Ambulatory Visit (INDEPENDENT_AMBULATORY_CARE_PROVIDER_SITE_OTHER): Payer: BLUE CROSS/BLUE SHIELD | Admitting: Family Medicine

## 2016-05-24 ENCOUNTER — Encounter: Payer: Self-pay | Admitting: Family Medicine

## 2016-05-24 VITALS — BP 129/83 | HR 62 | Temp 98.6°F | Resp 16 | Ht 66.5 in | Wt 171.2 lb

## 2016-05-24 DIAGNOSIS — K279 Peptic ulcer, site unspecified, unspecified as acute or chronic, without hemorrhage or perforation: Secondary | ICD-10-CM | POA: Diagnosis not present

## 2016-05-24 DIAGNOSIS — M19041 Primary osteoarthritis, right hand: Secondary | ICD-10-CM | POA: Diagnosis not present

## 2016-05-24 DIAGNOSIS — Z8249 Family history of ischemic heart disease and other diseases of the circulatory system: Secondary | ICD-10-CM

## 2016-05-24 DIAGNOSIS — I10 Essential (primary) hypertension: Secondary | ICD-10-CM

## 2016-05-24 DIAGNOSIS — Z131 Encounter for screening for diabetes mellitus: Secondary | ICD-10-CM | POA: Diagnosis not present

## 2016-05-24 DIAGNOSIS — K5000 Crohn's disease of small intestine without complications: Secondary | ICD-10-CM

## 2016-05-24 DIAGNOSIS — Z23 Encounter for immunization: Secondary | ICD-10-CM | POA: Diagnosis not present

## 2016-05-24 DIAGNOSIS — Z0000 Encounter for general adult medical examination without abnormal findings: Secondary | ICD-10-CM

## 2016-05-24 DIAGNOSIS — I2510 Atherosclerotic heart disease of native coronary artery without angina pectoris: Secondary | ICD-10-CM

## 2016-05-24 DIAGNOSIS — Z8489 Family history of other specified conditions: Secondary | ICD-10-CM | POA: Diagnosis not present

## 2016-05-24 DIAGNOSIS — Z125 Encounter for screening for malignant neoplasm of prostate: Secondary | ICD-10-CM

## 2016-05-24 DIAGNOSIS — Z87891 Personal history of nicotine dependence: Secondary | ICD-10-CM

## 2016-05-24 DIAGNOSIS — E785 Hyperlipidemia, unspecified: Secondary | ICD-10-CM | POA: Diagnosis not present

## 2016-05-24 DIAGNOSIS — M19049 Primary osteoarthritis, unspecified hand: Secondary | ICD-10-CM

## 2016-05-24 DIAGNOSIS — M19042 Primary osteoarthritis, left hand: Secondary | ICD-10-CM

## 2016-05-24 LAB — POCT URINALYSIS DIP (MANUAL ENTRY)
Bilirubin, UA: NEGATIVE
Glucose, UA: NEGATIVE
Ketones, POC UA: NEGATIVE
Leukocytes, UA: NEGATIVE
Nitrite, UA: NEGATIVE
Protein Ur, POC: NEGATIVE
Spec Grav, UA: 1.005 (ref 1.030–1.035)
Urobilinogen, UA: 0.2 (ref ?–2.0)
pH, UA: 6 (ref 5.0–8.0)

## 2016-05-24 MED ORDER — MELOXICAM 15 MG PO TABS: 15.0000 mg | ORAL_TABLET | Freq: Every day | ORAL | 3 refills | Status: DC

## 2016-05-24 MED ORDER — LISINOPRIL 20 MG PO TABS: 20.0000 mg | ORAL_TABLET | Freq: Every day | ORAL | 3 refills | Status: DC

## 2016-05-24 MED ORDER — ROSUVASTATIN CALCIUM 40 MG PO TABS: 40.0000 mg | ORAL_TABLET | Freq: Every day | ORAL | 3 refills | Status: DC

## 2016-05-24 NOTE — Progress Notes (Signed)
Subjective:    Patient ID: CHRSITOPHER WIK, male    DOB: 1950-08-02, 66 y.o.   MRN: [id number]  05/24/2016  Annual Exam   HPI This 66 y.o. male presents for Annual Wellness Examination with complete physical examination and follow-up of chronic medical conditions.  Last physical: 06-15-2015 Colonoscopy:  2012 [facility] PSA:  01/2016 with Kary Kos. Eye exam:  May 2017; +glasses; no glaucoma/macular degeneration/+mild cataracts. Dental exam:  Every six months.   Hematuria:  : Kary Kos wants PSA sent to him. Follow-up with urology 01/2016.   No previous shingles vaccine; had shingles age 66.  Provided with shingles vaccine rx at last visit.     Immunization History  Administered Date(s) Administered  . Influenza-Unspecified 12/12/2013, 12/22/2015  . Pneumococcal Conjugate-13 10/04/2014  . Pneumococcal Polysaccharide-23 05/24/2016  . Tdap 02/22/2012   BP Readings from Last 3 Encounters:  05/24/16 129/83  02/23/16 138/82  01/15/16 124/80   Wt Readings from Last 3 Encounters:  05/24/16 171 lb 3.2 oz (77.7 kg)  02/23/16 176 lb 3.2 oz (79.9 kg)  01/15/16 176 lb (79.8 kg)    Review of Systems  Constitutional: Negative for activity change, appetite change, chills, diaphoresis, fatigue, fever and unexpected weight change.  HENT: Negative for congestion, dental problem, drooling, ear discharge, ear pain, facial swelling, hearing loss, mouth sores, nosebleeds, postnasal drip, rhinorrhea, sinus pressure, sneezing, sore throat, tinnitus, trouble swallowing and voice change.   Eyes: Negative for photophobia, pain, discharge, redness, itching and visual disturbance.  Respiratory: Negative for apnea, cough, choking, chest tightness, shortness of breath, wheezing and stridor.   Cardiovascular: Negative for chest pain, palpitations and leg swelling.  Gastrointestinal: Negative for abdominal pain, blood in stool, constipation, diarrhea, nausea and vomiting.  Endocrine: Negative  for cold intolerance, heat intolerance, polydipsia, polyphagia and polyuria.  Genitourinary: Negative for decreased urine volume, difficulty urinating, discharge, dysuria, enuresis, flank pain, frequency, genital sores, hematuria, penile pain, penile swelling, scrotal swelling, testicular pain and urgency.       Nocturia x 0.  Urine stream is strong.  Musculoskeletal: Negative for arthralgias, back pain, gait problem, joint swelling, myalgias, neck pain and neck stiffness.  Skin: Negative for color change, pallor, rash and wound.  Allergic/Immunologic: Negative for environmental allergies, food allergies and immunocompromised state.  Neurological: Negative for dizziness, tremors, seizures, syncope, facial asymmetry, speech difficulty, weakness, light-headedness, numbness and headaches.  Hematological: Negative for adenopathy. Does not bruise/bleed easily.  Psychiatric/Behavioral: Negative for agitation, behavioral problems, confusion, decreased concentration, dysphoric mood, hallucinations, self-injury, sleep disturbance and suicidal ideas. The patient is not nervous/anxious and is not hyperactive.        Bedtime 8:30pm; wakes up at 1:15am.      Past Medical History:  Diagnosis Date  . Angina   . Arthritis   . CAD (coronary artery disease), non obstructive by cath 07/19/11, treat medically 07/19/2011  . Crohn disease ([place])   . Hypercholesterolemia   . Hyperlipidemia   . Myocardial infarction ([place])   . Pneumonia   . Skin cancer 2006   Left shoulder; followed every six months.     Past Surgical History:  Procedure Laterality Date  . APPENDECTOMY    . CARDIAC CATHETERIZATION  07/15/2011   Medical management  . HERNIA REPAIR    . KNEE SURGERY     arthroscopy  . LEFT HEART CATHETERIZATION WITH CORONARY ANGIOGRAM  07/19/2011   Procedure: LEFT HEART CATHETERIZATION WITH CORONARY ANGIOGRAM;  Surgeon: Leonie Man, MD;  Location: [facility] CATH LAB;  Service:  Cardiovascular;;  . TONSILLECTOMY      No Known Allergies Current Outpatient Prescriptions  Medication Sig Dispense Refill  . aspirin 81 MG tablet Take 81 mg by mouth daily.    . fluticasone (FLONASE) 50 MCG/ACT nasal spray Place into both nostrils daily.    Marland Kitchen lisinopril (PRINIVIL,ZESTRIL) 20 MG tablet Take 1 tablet (20 mg total) by mouth daily. 90 tablet 3  . meloxicam (MOBIC) 15 MG tablet Take 1 tablet (15 mg total) by mouth daily. 90 tablet 3  . mesalamine (APRISO) 0.375 G 24 hr capsule Take 1,500 mg by mouth every morning. 1535m=4 capsules    . nitroGLYCERIN (NITROSTAT) 0.4 MG SL tablet Place 1 tablet (0.4 mg total) under the tongue every 5 (five) minutes x 3 doses as needed for chest pain. 25 tablet 4  . rosuvastatin (CRESTOR) 40 MG tablet Take 1 tablet (40 mg total) by mouth daily. 90 tablet 3   No current facility-administered medications for this visit.    Social History   Social History  . Marital status: Married    Spouse name: [name] . Number of children: 2  . 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   Social History Main Topics  . Smoking status: Former Smoker    Quit date: 07/16/1998  . Smokeless tobacco: Never Used  . Alcohol use 4.8 oz/week    8 Cans of beer per week  . Drug use: No  . Sexual activity: Yes    Partners: Female    Birth control/ protection: None, Post-menopausal   Other Topics Concern  . 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      Lives: Lives with his wife and their pets.      Tobacco:  None; quit in 2000; smoked x 15 years + 20 years = 35 years.      Alcohol:    2-4 beers per day      Exercise: job physically demanding; mows yard.      Seatbelt: 100%; no texting      Family History  Problem Relation Age of Onset  . Cancer Mother     Breast cancer;  leukemia  . Heart disease Mother 759   CABG       Objective:    BP 129/83 (BP Location: Right Arm, Patient Position: Sitting, Cuff Size: Small)   Pulse 62   Temp 98.6 F (37 C) (Oral)   Resp 16   Ht 5' 6.5" (1.689 m)   Wt 171 lb 3.2 oz (77.7 kg)   SpO2 96%   BMI 27.22 kg/m  Physical Exam  Constitutional: He is oriented to person, place, and time. He appears well-developed and well-nourished. No distress.  HENT:  Head: Normocephalic and atraumatic.  Right Ear: External ear normal.  Left Ear: External ear normal.  Nose: Nose normal.  Mouth/Throat: Oropharynx is clear and moist.  Eyes: Conjunctivae and EOM are normal. Pupils are equal, round, and reactive to light.  Neck: Normal range of motion. Neck supple. Carotid bruit is not present. No thyromegaly present.  Cardiovascular: Normal rate, regular rhythm, normal heart sounds and intact distal pulses.  Exam reveals no gallop and no friction rub.   No  murmur heard. Pulmonary/Chest: Effort normal and breath sounds normal. He has no wheezes. He has no rales.  Abdominal: Soft. Bowel sounds are normal. He exhibits no distension and no mass. There is no tenderness. There is no rebound and no guarding. Hernia confirmed negative in the right inguinal area and confirmed negative in the left inguinal area.  Genitourinary: Testes normal and penis normal.  Musculoskeletal:       Right shoulder: Normal.       Left shoulder: Normal.       Cervical back: Normal.  R first MTP bunion with deviation.  Lymphadenopathy:    He has no cervical adenopathy.       Right: No inguinal adenopathy present.       Left: No inguinal adenopathy present.  Neurological: He is alert and oriented to person, place, and time. He has normal reflexes. No cranial nerve deficit. He exhibits normal muscle tone. Coordination normal.  Skin: Skin is warm and dry. No rash noted. He is not diaphoretic.  Psychiatric: He has a normal mood and affect. His behavior is normal.  Judgment and thought content normal.        Depression screen [facility] 2/9 05/24/2016 01/15/2016 10/07/2015 07/18/2015 06/15/2015  Decreased Interest 0 0 0 0 0  Down, Depressed, Hopeless 0 0 0 0 0  PHQ - 2 Score 0 0 0 0 0   Fall Risk  05/24/2016 01/15/2016 10/07/2015 08/26/2015 07/18/2015  Falls in the past year? No No No No No  Number falls in past yr: - - - - -  Injury with Fall? - - - - No   Functional Status Survey:    Assessment & Plan:   1. Routine physical examination   2. Coronary artery disease involving native coronary artery of native heart without angina pectoris   3. Essential hypertension   4. Crohn's disease of small intestine without complication ([place])   5. Primary osteoarthritis of both hands   6. Dyslipidemia   7. History of smoking   8. Family history of coronary artery bypass surgery   9. Peptic ulcer disease, endo 2011   10. Screening for diabetes mellitus   11. Screening for prostate cancer   12. Need for prophylactic vaccination against Streptococcus pneumoniae (pneumococcus)   13. Inflammation of hand joint, unspecified laterality    -anticipatory guidance provided -- exercise, weight loss, safe driving practices. -s/p Pneumovax. -obtain chronic disease state management labs; obtain age appropriate screening labs. -refills provided. -followed closely by gastroenterology for Crohn's disease. -followed closely by cardiology for CAD and history of AMI.     Orders Placed This Encounter  Procedures  . Pneumococcal polysaccharide vaccine 23-valent greater than or equal to 2yo subcutaneous/IM  . CBC with Differential/Platelet  . Comprehensive metabolic panel    Order Specific Question:   Has the patient fasted?    Answer:   Yes  . Hemoglobin A1c  . Lipid panel    Order Specific Question:   Has the patient fasted?    Answer:   Yes  . TSH  . PSA  . Vitamin B12  . VITAMIN D 25 Hydroxy (Vit-D Deficiency, Fractures)  . Iron  . POCT urinalysis dipstick   Meds  ordered this encounter  Medications  . lisinopril (PRINIVIL,ZESTRIL) 20 MG tablet    Sig: Take 1 tablet (20 mg total) by mouth daily.    Dispense:  90 tablet    Refill:  3  . meloxicam (MOBIC) 15 MG tablet    Sig:  Take 1 tablet (15 mg total) by mouth daily.    Dispense:  90 tablet    Refill:  3  . rosuvastatin (CRESTOR) 40 MG tablet    Sig: Take 1 tablet (40 mg total) by mouth daily.    Dispense:  90 tablet    Refill:  3    Please consider 90 day supplies to promote better adherence    Return in about 1 year (around 05/24/2017) for complete physical examiniation.   Daley Gosse Elayne Guerin, M.D. Primary Care at [facility] [address] [place], [place]  [address] [phone] phone [phone] fax

## 2016-05-24 NOTE — Patient Instructions (Addendum)
IF you received an x-ray today, you will receive an invoice from [facility]. Please contact [facility] at [phone] with questions or concerns regarding your invoice.   IF you received labwork today, you will receive an invoice from [facility]. Please contact [facility] at [phone] with questions or concerns regarding your invoice.   Our billing staff will not be able to assist you with questions regarding bills from these companies.  You will be contacted with the lab results as soon as they are available. The fastest way to get your results is to activate your My Chart account. Instructions are located on the last page of this paperwork. If you have not heard from [place] regarding the results in 2 weeks, please contact this office.     pre Preventive Care 68 Years and Older, Male Preventive care refers to lifestyle choices and visits with your health care provider that can promote health and wellness. What does preventive care include?  A yearly physical exam. This is also called an annual well check.  Dental exams once or twice a year.  Routine eye exams. Ask your health care provider how often you should have your eyes checked.  Personal lifestyle choices, including:  Daily care of your teeth and gums.  Regular physical activity.  Eating a healthy diet.  Avoiding tobacco and drug use.  Limiting alcohol use.  Practicing safe sex.  Taking low doses of aspirin every day.  Taking vitamin and mineral supplements as recommended by your health care provider. What happens during an annual well check? The services and screenings done by your health care provider during your annual well check will depend on your age, overall health, lifestyle risk factors, and family history of disease. Counseling  Your health care provider may ask you questions about your:  Alcohol use.  Tobacco use.  Drug use.  Emotional well-being.  Home and relationship  well-being.  Sexual activity.  Eating habits.  History of falls.  Memory and ability to understand (cognition).  Work and work Statistician. Screening  You may have the following tests or measurements:  Height, weight, and BMI.  Blood pressure.  Lipid and cholesterol levels. These may be checked every 5 years, or more frequently if you are over 66 years old.  Skin check.  Lung cancer screening. You may have this screening every year starting at age 17 if you have a 30-pack-year history of smoking and currently smoke or have quit within the past 15 years.  Fecal occult blood test (FOBT) of the stool. You may have this test every year starting at age 53.  Flexible sigmoidoscopy or colonoscopy. You may have a sigmoidoscopy every 5 years or a colonoscopy every 10 years starting at age 48.  Prostate cancer screening. Recommendations will vary depending on your family history and other risks.  Hepatitis C blood test.  Hepatitis B blood test.  Sexually transmitted disease (STD) testing.  Diabetes screening. This is done by checking your blood sugar (glucose) after you have not eaten for a while (fasting). You may have this done every 1-3 years.  Abdominal aortic aneurysm (AAA) screening. You may need this if you are a current or former smoker.  Osteoporosis. You may be screened starting at age 2 if you are at high risk. Talk with your health care provider about your test results, treatment options, and if necessary, the need for more tests. Vaccines  Your health care provider may recommend certain vaccines, such as:  Influenza vaccine.  is recommended every year.  Tetanus, diphtheria, and acellular pertussis (Tdap, Td) vaccine. You may need a Td booster every 10 years.  Varicella vaccine. You may need this if you have not been vaccinated.  Zoster vaccine. You may need this after age 60.  Measles, mumps, and rubella (MMR) vaccine. You may need at least one dose of  MMR if you were born in 1957 or later. You may also need a second dose.  Pneumococcal 13-valent conjugate (PCV13) vaccine. One dose is recommended after age 65.  Pneumococcal polysaccharide (PPSV23) vaccine. One dose is recommended after age 65.  Meningococcal vaccine. You may need this if you have certain conditions.  Hepatitis A vaccine. You may need this if you have certain conditions or if you travel or work in places where you may be exposed to hepatitis A.  Hepatitis B vaccine. You may need this if you have certain conditions or if you travel or work in places where you may be exposed to hepatitis B.  Haemophilus influenzae type b (Hib) vaccine. You may need this if you have certain risk factors.  Talk to your health care provider about which screenings and vaccines you need and how often you need them. This information is not intended to replace advice given to you by your health care provider. Make sure you discuss any questions you have with your health care provider. Document Released: 03/06/2015 Document Revised: 10/28/2015 Document Reviewed: 12/09/2014 Elsevier Interactive Patient Education  2017 Elsevier Inc.  

## 2016-05-25 LAB — CBC WITH DIFFERENTIAL/PLATELET
Basophils Absolute: 0.1 10*3/uL (ref 0.0–0.2)
Basos: 1 %
EOS (ABSOLUTE): 0.2 10*3/uL (ref 0.0–0.4)
Eos: 2 %
Hematocrit: 39.6 % (ref 37.5–51.0)
Hemoglobin: 13.6 g/dL (ref 13.0–17.7)
Immature Grans (Abs): 0 10*3/uL (ref 0.0–0.1)
Immature Granulocytes: 0 %
Lymphocytes Absolute: 2.5 10*3/uL (ref 0.7–3.1)
Lymphs: 29 %
MCH: 31.1 pg (ref 26.6–33.0)
MCHC: 34.3 g/dL (ref 31.5–35.7)
MCV: 90 fL (ref 79–97)
Monocytes Absolute: 0.8 10*3/uL (ref 0.1–0.9)
Monocytes: 10 %
Neutrophils Absolute: 5.1 10*3/uL (ref 1.4–7.0)
Neutrophils: 58 %
Platelets: 297 10*3/uL (ref 150–379)
RBC: 4.38 x10E6/uL (ref 4.14–5.80)
RDW: 14.2 % (ref 12.3–15.4)
WBC: 8.7 10*3/uL (ref 3.4–10.8)

## 2016-05-25 LAB — COMPREHENSIVE METABOLIC PANEL
ALT: 26 IU/L (ref 0–44)
AST: 35 IU/L (ref 0–40)
Albumin/Globulin Ratio: 2.1 (ref 1.2–2.2)
Albumin: 4.8 g/dL (ref 3.6–4.8)
Alkaline Phosphatase: 32 IU/L — ABNORMAL LOW (ref 39–117)
BUN/Creatinine Ratio: 16 (ref 10–24)
BUN: 15 mg/dL (ref 8–27)
Bilirubin Total: 0.6 mg/dL (ref 0.0–1.2)
CO2: 24 mmol/L (ref 18–29)
Calcium: 9.7 mg/dL (ref 8.6–10.2)
Chloride: 97 mmol/L (ref 96–106)
Creatinine, Ser: 0.93 mg/dL (ref 0.76–1.27)
GFR calc Af Amer: 98 mL/min/{1.73_m2} (ref >59)
GFR calc non Af Amer: 85 mL/min/{1.73_m2} (ref >59)
Globulin, Total: 2.3 g/dL (ref 1.5–4.5)
Glucose: 80 mg/dL (ref 65–99)
Potassium: 4.2 mmol/L (ref 3.5–5.2)
Sodium: 138 mmol/L (ref 134–144)
Total Protein: 7.1 g/dL (ref 6.0–8.5)

## 2016-05-25 LAB — HEMOGLOBIN A1C
Est. average glucose Bld gHb Est-mCnc: 108 mg/dL
Hgb A1c MFr Bld: 5.4 % (ref 4.8–5.6)

## 2016-05-25 LAB — LIPID PANEL
Chol/HDL Ratio: 2 ratio (ref 0.0–5.0)
Cholesterol, Total: 175 mg/dL (ref 100–199)
HDL: 86 mg/dL (ref >39)
LDL Calculated: 76 mg/dL (ref 0–99)
Triglycerides: 67 mg/dL (ref 0–149)
VLDL Cholesterol Cal: 13 mg/dL (ref 5–40)

## 2016-05-25 LAB — PSA: Prostate Specific Ag, Serum: 0.3 ng/mL (ref 0.0–4.0)

## 2016-05-25 LAB — TSH: TSH: 4.75 u[IU]/mL — ABNORMAL HIGH (ref 0.450–4.500)

## 2016-07-19 ENCOUNTER — Telehealth: Payer: Self-pay | Admitting: Family Medicine

## 2016-07-19 DIAGNOSIS — M19049 Primary osteoarthritis, unspecified hand: Secondary | ICD-10-CM

## 2016-07-19 MED ORDER — LISINOPRIL 20 MG PO TABS: 20.0000 mg | ORAL_TABLET | Freq: Every day | ORAL | 0 refills | Status: DC

## 2016-07-19 MED ORDER — MELOXICAM 15 MG PO TABS: 15.0000 mg | ORAL_TABLET | Freq: Every day | ORAL | 0 refills | Status: DC

## 2016-07-19 NOTE — Telephone Encounter (Signed)
PT CALLING FOR A REFILL ON MELOXICAM AND LISINOPRIL I SEE THAT HE WAS GIVEN 90 PILLS WITH 3 REFILLS PHARMACY STATES THAT THEY NEVER RECEIVED ANYTHING

## 2016-12-07 ENCOUNTER — Encounter: Payer: Self-pay | Admitting: Cardiovascular Disease

## 2016-12-07 ENCOUNTER — Ambulatory Visit (INDEPENDENT_AMBULATORY_CARE_PROVIDER_SITE_OTHER): Payer: BLUE CROSS/BLUE SHIELD | Admitting: Cardiovascular Disease

## 2016-12-07 DIAGNOSIS — I2510 Atherosclerotic heart disease of native coronary artery without angina pectoris: Secondary | ICD-10-CM | POA: Diagnosis not present

## 2016-12-07 DIAGNOSIS — E785 Hyperlipidemia, unspecified: Secondary | ICD-10-CM

## 2016-12-07 DIAGNOSIS — I10 Essential (primary) hypertension: Secondary | ICD-10-CM

## 2016-12-07 NOTE — Assessment & Plan Note (Signed)
History of hyperlipidemia on statin therapy with recent lipid profile performed 05/24/16 revealing LDL 76 and HDL of 86.

## 2016-12-07 NOTE — Patient Instructions (Signed)

## 2016-12-07 NOTE — Addendum Note (Signed)
Addended by: Zebedee Iba on: 12/07/2016 03:02 PM   Modules accepted: Orders

## 2016-12-07 NOTE — Progress Notes (Signed)
12/07/2016 Frederick Rush   [birth year]  [id number]  Primary Physician Wardell Honour, MD Primary Cardiologist: Lorretta Harp MD Garret Reddish, [place], [place]  HPI:  Frederick Rush is a 66 y.o. male mildly overweight, married, Caucasian male, father of two, grandfather to two grandchildren, who I last saw in the office 12/01/15  He has a history of noncritical CAD found on cath performed by Dr. Ula Lingo on Jul 15, 2011. His other problems include hyperlipidemia and remote tobacco abuse. He denies chest pain or shortness of breath.   Current Meds  Medication Sig  . aspirin 81 MG tablet Take 81 mg by mouth daily.  . fluticasone (FLONASE) 50 MCG/ACT nasal spray Place into both nostrils daily.  Marland Kitchen lisinopril (PRINIVIL,ZESTRIL) 20 MG tablet Take 1 tablet (20 mg total) by mouth daily.  . meloxicam (MOBIC) 15 MG tablet Take 1 tablet (15 mg total) by mouth daily.  . mesalamine (APRISO) 0.375 G 24 hr capsule Take 1,500 mg by mouth every morning. 154m=4 capsules  . nitroGLYCERIN (NITROSTAT) 0.4 MG SL tablet Place 1 tablet (0.4 mg total) under the tongue every 5 (five) minutes x 3 doses as needed for chest pain.  . rosuvastatin (CRESTOR) 40 MG tablet Take 1 tablet (40 mg total) by mouth daily.     No Known Allergies  Social History   Social History  . Marital status: Married    Spouse name: [name] . Number of children: 2  . 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   Social History Main Topics  . Smoking status: Former Smoker    Quit date: 07/16/1998  . Smokeless tobacco: Never Used  . Alcohol use 4.8 oz/week    8 Cans of beer per week  . Drug use: No  . Sexual activity: Yes    Partners: Female    Birth control/ protection: None, Post-menopausal   Other Topics Concern  . 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      Lives: Lives with his wife and their pets.      Tobacco:  None; quit in 2000; smoked x 15 years + 20 years = 35 years.      Alcohol:    2-4 beers per day      Exercise: job physically demanding; mows yard.      Seatbelt: 100%; no texting        Review of Systems: General: negative for chills, fever, night sweats or weight changes.  Cardiovascular: negative for chest pain, dyspnea on exertion, edema, orthopnea, palpitations, paroxysmal nocturnal dyspnea or shortness of breath Dermatological: negative for rash Respiratory: negative for cough or wheezing Urologic: negative for hematuria Abdominal: negative for nausea, vomiting, diarrhea, bright red blood per rectum, melena, or hematemesis Neurologic: negative for visual changes, syncope, or dizziness All other systems reviewed and are otherwise negative except as noted above.    Blood pressure 138/82, pulse 61, height 5' 8.5" (1.74 m), weight 170 lb 9.6 oz (77.4 kg).  General appearance: alert and no distress Neck: no adenopathy, no carotid bruit, no JVD, supple, symmetrical, trachea midline and thyroid not enlarged, symmetric, no tenderness/mass/nodules Lungs: clear to auscultation bilaterally Heart: regular rate  and rhythm, S1, S2 normal, no murmur, click, rub or gallop Extremities: extremities normal, atraumatic, no cyanosis or edema Pulses: 2+ and symmetric Skin: Skin color, texture, turgor normal. No rashes or lesions Neurologic: Alert and oriented X 3, normal strength and tone. Normal symmetric reflexes. Normal coordination and gait  EKG sinus rhythm at 61 without ST or T-wave changes. I personally reviewed this EKG.  ASSESSMENT AND PLAN:   Dyslipidemia History of hyperlipidemia on statin therapy with recent lipid profile performed 05/24/16 revealing LDL 76 and HDL of 86.  CAD (coronary artery disease), non obstructive by cath 07/19/11, treat medically History  of noncritical CAD by cath 07/15/11. He is asymptomatic  Essential hypertension History of essential hypertension blood pressure measures 138/82. He is on lisinopril. Continue current meds at current dosing.      Lorretta Harp MD FACP,FACC,FAHA, [facility] 12/07/2016 2:59 PM

## 2016-12-07 NOTE — Assessment & Plan Note (Signed)
History of noncritical CAD by cath 07/15/11. He is asymptomatic

## 2016-12-07 NOTE — Assessment & Plan Note (Signed)
History of essential hypertension blood pressure measures 138/82. He is on lisinopril. Continue current meds at current dosing.

## 2017-05-01 ENCOUNTER — Encounter: Payer: Self-pay | Admitting: Family Medicine

## 2017-05-01 ENCOUNTER — Ambulatory Visit: Payer: BLUE CROSS/BLUE SHIELD | Admitting: Family Medicine

## 2017-05-01 VITALS — BP 140/68 | HR 82 | Temp 98.5°F | Ht 67.72 in | Wt 171.2 lb

## 2017-05-01 DIAGNOSIS — I10 Essential (primary) hypertension: Secondary | ICD-10-CM | POA: Diagnosis not present

## 2017-05-01 DIAGNOSIS — M19042 Primary osteoarthritis, left hand: Secondary | ICD-10-CM | POA: Diagnosis not present

## 2017-05-01 DIAGNOSIS — K5000 Crohn's disease of small intestine without complications: Secondary | ICD-10-CM

## 2017-05-01 DIAGNOSIS — J0101 Acute recurrent maxillary sinusitis: Secondary | ICD-10-CM

## 2017-05-01 DIAGNOSIS — R509 Fever, unspecified: Secondary | ICD-10-CM | POA: Diagnosis not present

## 2017-05-01 DIAGNOSIS — M19041 Primary osteoarthritis, right hand: Secondary | ICD-10-CM | POA: Diagnosis not present

## 2017-05-01 DIAGNOSIS — I2510 Atherosclerotic heart disease of native coronary artery without angina pectoris: Secondary | ICD-10-CM | POA: Diagnosis not present

## 2017-05-01 DIAGNOSIS — E785 Hyperlipidemia, unspecified: Secondary | ICD-10-CM

## 2017-05-01 MED ORDER — MELOXICAM 15 MG PO TABS: 15.0000 mg | ORAL_TABLET | Freq: Every day | ORAL | 0 refills | Status: DC

## 2017-05-01 MED ORDER — CLARITHROMYCIN ER 500 MG PO TB24: 1000.0000 mg | ORAL_TABLET | Freq: Every day | ORAL | 0 refills | Status: DC

## 2017-05-01 MED ORDER — ROSUVASTATIN CALCIUM 40 MG PO TABS: 40.0000 mg | ORAL_TABLET | Freq: Every day | ORAL | 0 refills | Status: DC

## 2017-05-01 MED ORDER — LISINOPRIL 20 MG PO TABS: 20.0000 mg | ORAL_TABLET | Freq: Every day | ORAL | 0 refills | Status: DC

## 2017-05-01 NOTE — Patient Instructions (Addendum)
IF you received an x-ray today, you will receive an invoice from [facility]. Please contact [facility] at [phone] with questions or concerns regarding your invoice.   IF you received labwork today, you will receive an invoice from [facility]. Please contact [facility] at [phone] with questions or concerns regarding your invoice.   Our billing staff will not be able to assist you with questions regarding bills from these companies.  You will be contacted with the lab results as soon as they are available. The fastest way to get your results is to activate your My Chart account. Instructions are located on the last page of this paperwork. If you have not heard from [place] regarding the results in 2 weeks, please contact this office.      Sinusitis, Adult Sinusitis is soreness and inflammation of your sinuses. Sinuses are hollow spaces in the bones around your face. Your sinuses are located:  Around your eyes.  In the middle of your forehead.  Behind your nose.  In your cheekbones.  Your sinuses and nasal passages are lined with a stringy fluid (mucus). Mucus normally drains out of your sinuses. When your nasal tissues become inflamed or swollen, the mucus can become trapped or blocked so air cannot flow through your sinuses. This allows bacteria, viruses, and funguses to grow, which leads to infection. Sinusitis can develop quickly and last for 7?10 days (acute) or for more than 12 weeks (chronic). Sinusitis often develops after a cold. What are the causes? This condition is caused by anything that creates swelling in the sinuses or stops mucus from draining, including:  Allergies.  Asthma.  Bacterial or viral infection.  Abnormally shaped bones between the nasal passages.  Nasal growths that contain mucus (nasal polyps).  Narrow sinus openings.  Pollutants, such as chemicals or irritants in the air.  A foreign object stuck in the nose.  A fungal  infection. This is rare.  What increases the risk? The following factors may make you more likely to develop this condition:  Having allergies or asthma.  Having had a recent cold or respiratory tract infection.  Having structural deformities or blockages in your nose or sinuses.  Having a weak immune system.  Doing a lot of swimming or diving.  Overusing nasal sprays.  Smoking.  What are the signs or symptoms? The main symptoms of this condition are pain and a feeling of pressure around the affected sinuses. Other symptoms include:  Upper toothache.  Earache.  Headache.  Bad breath.  Decreased sense of smell and taste.  A cough that may get worse at night.  Fatigue.  Fever.  Thick drainage from your nose. The drainage is often green and it may contain pus (purulent).  Stuffy nose or congestion.  Postnasal drip. This is when extra mucus collects in the throat or back of the nose.  Swelling and warmth over the affected sinuses.  Sore throat.  Sensitivity to light.  How is this diagnosed? This condition is diagnosed based on symptoms, a medical history, and a physical exam. To find out if your condition is acute or chronic, your health care provider may:  Look in your nose for signs of nasal polyps.  Tap over the affected sinus to check for signs of infection.  View the inside of your sinuses using an imaging device that has a light attached (endoscope).  If your health care provider suspects that you have chronic sinusitis, you may also:  Be tested for allergies.  Have a sample of mucus taken from your nose (nasal culture) and checked for bacteria.  Have a mucus sample examined to see if your sinusitis is related to an allergy.  If your sinusitis does not respond to treatment and it lasts longer than 8 weeks, you may have an MRI or CT scan to check your sinuses. These scans also help to determine how severe your infection is. In rare cases, a bone  biopsy may be done to rule out more serious types of fungal sinus disease. How is this treated? Treatment for sinusitis depends on the cause and whether your condition is chronic or acute. If a virus is causing your sinusitis, your symptoms will go away on their own within 10 days. You may be given medicines to relieve your symptoms, including:  Topical nasal decongestants. They shrink swollen nasal passages and let mucus drain from your sinuses.  Antihistamines. These drugs block inflammation that is triggered by allergies. This can help to ease swelling in your nose and sinuses.  Topical nasal corticosteroids. These are nasal sprays that ease inflammation and swelling in your nose and sinuses.  Nasal saline washes. These rinses can help to get rid of thick mucus in your nose.  If your condition is caused by bacteria, you will be given an antibiotic medicine. If your condition is caused by a fungus, you will be given an antifungal medicine. Surgery may be needed to correct underlying conditions, such as narrow nasal passages. Surgery may also be needed to remove polyps. Follow these instructions at home: Medicines  Take, use, or apply over-the-counter and prescription medicines only as told by your health care provider. These may include nasal sprays.  If you were prescribed an antibiotic medicine, take it as told by your health care provider. Do not stop taking the antibiotic even if you start to feel better. Hydrate and Humidify  Drink enough water to keep your urine clear or pale yellow. Staying hydrated will help to thin your mucus.  Use a cool mist humidifier to keep the humidity level in your home above 50%.  Inhale steam for 10-15 minutes, 3-4 times a day or as told by your health care provider. You can do this in the bathroom while a hot shower is running.  Limit your exposure to cool or dry air. Rest  Rest as much as possible.  Sleep with your head raised  (elevated).  Make sure to get enough sleep each night. General instructions  Apply a warm, moist washcloth to your face 3-4 times a day or as told by your health care provider. This will help with discomfort.  Wash your hands often with soap and water to reduce your exposure to viruses and other germs. If soap and water are not available, use hand sanitizer.  Do not smoke. Avoid being around people who are smoking (secondhand smoke).  Keep all follow-up visits as told by your health care provider. This is important. Contact a health care provider if:  You have a fever.  Your symptoms get worse.  Your symptoms do not improve within 10 days. Get help right away if:  You have a severe headache.  You have persistent vomiting.  You have pain or swelling around your face or eyes.  You have vision problems.  You develop confusion.  Your neck is stiff.  You have trouble breathing. This information is not intended to replace advice given to you by your health care provider. Make sure you discuss any questions you  have with your health care provider. Document Released: 02/07/2005 Document Revised: 10/04/2015 Document Reviewed: 12/03/2014 Elsevier Interactive Patient Education  [employer].

## 2017-05-01 NOTE — Progress Notes (Signed)
Subjective:    Patient ID: Frederick Rush, male    DOB: Feb 20, 1951, 67 y.o.   MRN: [id number]  05/01/2017  sinous pain (his face near his nose. rn for 2x weeks. started feeling real bad yesterday. )    HPI This 66 y.o. male presents for evaluation of acute illness.  Onset two weeks ago.  Rhinorrhea for two weeks; started feeling horrible two days ago.  No fever yet feverish; chills and sweats. R maxillary and frontal sinus pain. R ear pain.  Minimal cough; +PND: no ear pain.  No SOB.  No n/v/d.   Doing well with hypertension and hypercholesterolemia.  Patient reports good compliance with medication, good tolerance to medication, and good symptom control.  Needs refill on all medications; has upcoming appointment for CPE in upcoming month.   BP Readings from Last 3 Encounters:  05/01/17 140/68  12/07/16 138/82  05/24/16 129/83   Wt Readings from Last 3 Encounters:  05/01/17 171 lb 3.2 oz (77.7 kg)  12/07/16 170 lb 9.6 oz (77.4 kg)  05/24/16 171 lb 3.2 oz (77.7 kg)   Immunization History  Administered Date(s) Administered  . Influenza-Unspecified 12/12/2013, 12/22/2015, 11/21/2016  . Pneumococcal Conjugate-13 10/04/2014  . Pneumococcal Polysaccharide-23 05/24/2016  . Tdap 02/22/2012    Review of Systems  Constitutional: Negative for activity change, appetite change, chills, diaphoresis, fatigue and fever.  HENT: Positive for congestion, postnasal drip, rhinorrhea, sinus pressure and sinus pain. Negative for sore throat, trouble swallowing and voice change.   Respiratory: Negative for cough and shortness of breath.   Cardiovascular: Negative for chest pain, palpitations and leg swelling.  Gastrointestinal: Negative for abdominal pain, diarrhea, nausea and vomiting.  Endocrine: Negative for cold intolerance, heat intolerance, polydipsia, polyphagia and polyuria.  Skin: Negative for color change, rash and wound.  Neurological: Negative for dizziness, tremors, seizures, syncope,  facial asymmetry, speech difficulty, weakness, light-headedness, numbness and headaches.  Psychiatric/Behavioral: Negative for dysphoric mood and sleep disturbance. The patient is not nervous/anxious.     Past Medical History:  Diagnosis Date  . Angina   . Arthritis   . CAD (coronary artery disease), non obstructive by cath 07/19/11, treat medically 07/19/2011  . Crohn disease ([place])   . Hypercholesterolemia   . Hyperlipidemia   . Myocardial infarction ([place])   . Pneumonia   . Skin cancer 2006   Left shoulder; followed every six months.     Past Surgical History:  Procedure Laterality Date  . APPENDECTOMY    . CARDIAC CATHETERIZATION  07/15/2011   Medical management  . HERNIA REPAIR    . KNEE SURGERY     arthroscopy  . LEFT HEART CATHETERIZATION WITH CORONARY ANGIOGRAM  07/19/2011   Procedure: LEFT HEART CATHETERIZATION WITH CORONARY ANGIOGRAM;  Surgeon: Leonie Man, MD;  Location[facility] CATH LAB;  Service: Cardiovascular;;  . TONSILLECTOMY     No Known Allergies Current Outpatient Medications on File Prior to Visit  Medication Sig Dispense Refill  . aspirin 81 MG tablet Take 81 mg by mouth daily.    . fluticasone (FLONASE) 50 MCG/ACT nasal spray Place into both nostrils daily.    . mesalamine (APRISO) 0.375 G 24 hr capsule Take 1,500 mg by mouth every morning. 1576m=4 capsules    . nitroGLYCERIN (NITROSTAT) 0.4 MG SL tablet Place 1 tablet (0.4 mg total) under the tongue every 5 (five) minutes x 3 doses as needed for chest pain. 25 tablet 4   No current facility-administered medications on file prior to visit.  Social History   Socioeconomic History  . Marital status: Married    Spouse name: Shirlean Mylar  . Number of children: 2  . Years of education: Associates  . Highest education level: Not on file  Occupational History  . Occupation: BULK DRIVER    Employer: [employer]  Social Needs  . Financial resource strain: Not on file  . Food insecurity:    Worry:  Not on file    Inability: Not on file  . Transportation needs:    Medical: Not on file    Non-medical: Not on file  Tobacco Use  . Smoking status: Former Smoker    Last attempt to quit: 07/16/1998    Years since quitting: 18.8  . Smokeless tobacco: Never Used  Substance and Sexual Activity  . Alcohol use: Yes    Alcohol/week: 4.8 oz    Types: 8 Cans of beer per week  . Drug use: No  . Sexual activity: Yes    Partners: Female    Birth control/protection: None, Post-menopausal  Lifestyle  . Physical activity:    Days per week: Not on file    Minutes per session: Not on file  . Stress: Not on file  Relationships  . Social connections:    Talks on phone: Not on file    Gets together: Not on file    Attends religious service: Not on file    Active member of club or organization: Not on file    Attends meetings of clubs or organizations: Not on file    Relationship status: Not on file  . Intimate partner violence:    Fear of current or ex partner: Not on file    Emotionally abused: Not on file    Physically abused: Not on file    Forced sexual activity: Not on file  Other Topics Concern  . 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      Lives: Lives with his wife and their pets.      Tobacco:  None; quit in 2000; smoked x 15 years + 20 years = 35 years.      Alcohol:    2-4 beers per day      Exercise: job physically demanding; mows yard.      Seatbelt: 100%; no texting   Family History  Problem Relation Age of Onset  . Cancer Mother        Breast cancer; leukemia  . Heart disease Mother 54       CABG       Objective:    BP 140/68 (BP Location: Left Arm, Patient Position: Sitting, Cuff Size: Normal)   Pulse 82   Temp 98.5 F (36.9 C) (Oral)   Ht 5' 7.72" (1.72 m)   Wt 171 lb 3.2 oz (77.7 kg)   SpO2  97%   BMI 26.25 kg/m  Physical Exam  Constitutional: He is oriented to person, place, and time. He appears well-developed and well-nourished. No distress.  HENT:  Head: Normocephalic and atraumatic.  Right Ear: Tympanic membrane, external ear and ear canal normal.  Left Ear: Tympanic membrane, external ear and ear canal normal.  Nose: Mucosal edema and rhinorrhea present. Right sinus exhibits maxillary sinus tenderness. Right sinus exhibits no frontal  sinus tenderness. Left sinus exhibits maxillary sinus tenderness. Left sinus exhibits no frontal sinus tenderness.  Mouth/Throat: Oropharynx is clear and moist.  Eyes: Pupils are equal, round, and reactive to light. Conjunctivae and EOM are normal.  Neck: Normal range of motion. Neck supple. Carotid bruit is not present. No thyromegaly present.  Cardiovascular: Normal rate, regular rhythm, normal heart sounds and intact distal pulses. Exam reveals no gallop and no friction rub.  No murmur heard. Pulmonary/Chest: Effort normal and breath sounds normal. He has no wheezes. He has no rales.  Lymphadenopathy:    He has no cervical adenopathy.  Neurological: He is alert and oriented to person, place, and time. No cranial nerve deficit.  Skin: Skin is warm and dry. No rash noted. He is not diaphoretic.  Psychiatric: He has a normal mood and affect. His behavior is normal.  Nursing note and vitals reviewed.  No results found. Depression screen [facility] 2/9 05/01/2017 05/24/2016 01/15/2016 10/07/2015 07/18/2015  Decreased Interest 0 0 0 0 0  Down, Depressed, Hopeless 0 0 0 0 0  PHQ - 2 Score 0 0 0 0 0   Fall Risk  05/01/2017 05/24/2016 01/15/2016 10/07/2015 08/26/2015  Falls in the past year? No No No No No  Number falls in past yr: - - - - -  Injury with Fall? - - - - -         Assessment & Plan:   1. Acute recurrent maxillary sinusitis   2. Fever, unspecified   3. Primary osteoarthritis of both hands   4. Crohn's disease of small intestine without  complication ([place])   5. Essential hypertension   6. Dyslipidemia   7. Coronary artery disease involving native coronary artery of native heart without angina pectoris     Recurrent maxillary sinusitis:  New; rx for Biaxin provided.  Supportive care with rest, flonase, nasal irrigation. Osteoarthritis hands B: refill of Meloxicam provided. Crohn's disease: stable; followed by GI. HTN; controlled; refills. Hypercholesterolemia: controlled; refills   Orders Placed This Encounter  Procedures  . POC Influenza A&B(BINAX/QUICKVUE)   Meds ordered this encounter  Medications  . clarithromycin (BIAXIN XL) 500 MG 24 hr tablet    Sig: Take 2 tablets (1,000 mg total) by mouth daily.    Dispense:  20 tablet    Refill:  0  . lisinopril (PRINIVIL,ZESTRIL) 20 MG tablet    Sig: Take 1 tablet (20 mg total) by mouth daily.    Dispense:  90 tablet    Refill:  0  . rosuvastatin (CRESTOR) 40 MG tablet    Sig: Take 1 tablet (40 mg total) by mouth daily.    Dispense:  90 tablet    Refill:  0    Please consider 90 day supplies to promote better adherence  . meloxicam (MOBIC) 15 MG tablet    Sig: Take 1 tablet (15 mg total) by mouth daily.    Dispense:  90 tablet    Refill:  0    Return in about 4 weeks (around 05/29/2017) for complete physical examiniation.   Ayame Rena Elayne Guerin, M.D. Primary [facility] at [facility] [address] [place], [place]  [phone] phone [phone] fax

## 2017-05-22 ENCOUNTER — Telehealth: Payer: Self-pay | Admitting: Cardiovascular Disease

## 2017-05-22 NOTE — Telephone Encounter (Signed)
Per pt has noted cramps in shoulders,arms and legs.Pt does not feel weakness or any other symptoms.reviewed labs and last labs were from last year Per pt is scheduled for physical  mid April encouraged  pt to have a potassium level checked .  Also increase K rich foods in diet . Will forward to Dr Gwenlyn Found for review .Adonis Housekeeper

## 2017-05-22 NOTE — Telephone Encounter (Signed)
New message     Patient calling with concerns about leg cramps. Please call

## 2017-05-25 NOTE — Telephone Encounter (Signed)
No cardiac issues related to leg cramping. Suggest that she follow-up with her PCP

## 2017-06-07 NOTE — Telephone Encounter (Signed)
Left detail message with recommendation, ok per DPR, and to call back if any questions.

## 2017-06-09 LAB — POC INFLUENZA A&B (BINAX/QUICKVUE)
Influenza A, POC: NEGATIVE
Influenza B, POC: NEGATIVE

## 2017-06-14 ENCOUNTER — Encounter: Payer: Self-pay | Admitting: Family Medicine

## 2017-06-14 ENCOUNTER — Ambulatory Visit (INDEPENDENT_AMBULATORY_CARE_PROVIDER_SITE_OTHER): Payer: BLUE CROSS/BLUE SHIELD | Admitting: Family Medicine

## 2017-06-14 ENCOUNTER — Other Ambulatory Visit: Payer: Self-pay

## 2017-06-14 VITALS — BP 122/70 | HR 69 | Temp 98.0°F | Resp 16 | Ht 67.32 in | Wt 166.0 lb

## 2017-06-14 DIAGNOSIS — Z0000 Encounter for general adult medical examination without abnormal findings: Secondary | ICD-10-CM | POA: Diagnosis not present

## 2017-06-14 DIAGNOSIS — I2510 Atherosclerotic heart disease of native coronary artery without angina pectoris: Secondary | ICD-10-CM | POA: Diagnosis not present

## 2017-06-14 DIAGNOSIS — Z125 Encounter for screening for malignant neoplasm of prostate: Secondary | ICD-10-CM

## 2017-06-14 DIAGNOSIS — M19042 Primary osteoarthritis, left hand: Secondary | ICD-10-CM

## 2017-06-14 DIAGNOSIS — Z87891 Personal history of nicotine dependence: Secondary | ICD-10-CM

## 2017-06-14 DIAGNOSIS — K5000 Crohn's disease of small intestine without complications: Secondary | ICD-10-CM

## 2017-06-14 DIAGNOSIS — I10 Essential (primary) hypertension: Secondary | ICD-10-CM

## 2017-06-14 DIAGNOSIS — M19041 Primary osteoarthritis, right hand: Secondary | ICD-10-CM | POA: Diagnosis not present

## 2017-06-14 DIAGNOSIS — E785 Hyperlipidemia, unspecified: Secondary | ICD-10-CM

## 2017-06-14 MED ORDER — LISINOPRIL 20 MG PO TABS: 20.0000 mg | ORAL_TABLET | Freq: Every day | ORAL | 1 refills | Status: DC

## 2017-06-14 MED ORDER — ROSUVASTATIN CALCIUM 40 MG PO TABS: 40.0000 mg | ORAL_TABLET | Freq: Every day | ORAL | 1 refills | Status: DC

## 2017-06-14 MED ORDER — MELOXICAM 15 MG PO TABS: 15.0000 mg | ORAL_TABLET | Freq: Every day | ORAL | 1 refills | Status: DC

## 2017-06-14 NOTE — Progress Notes (Signed)
Subjective:    Patient ID: Frederick Rush, male    DOB: [birth year], 67 y.o.   MRN: [id number]  06/14/2017  Annual Exam    HPI This 67 y.o. male presents for COMPLETE PHYSICAL EXAMINATION.   Visual Acuity Screening   Right eye Left eye Both eyes  Without correction:     With correction: 20/13 20/13 20/13     BP Readings from Last 3 Encounters:  06/14/17 122/70  05/01/17 140/68  12/07/16 138/82   Wt Readings from Last 3 Encounters:  06/14/17 166 lb (75.3 kg)  05/01/17 171 lb 3.2 oz (77.7 kg)  12/07/16 170 lb 9.6 oz (77.4 kg)   Immunization History  Administered Date(s) Administered  . Influenza-Unspecified 12/12/2013, 12/22/2015, 11/21/2016  . Pneumococcal Conjugate-13 10/04/2014  . Pneumococcal Polysaccharide-23 05/24/2016  . Tdap 02/22/2012   Health Maintenance  Topic Date Due  . COLONOSCOPY  02/05/1999  . INFLUENZA VACCINE  09/21/2017  . TETANUS/TDAP  02/21/2022  . Hepatitis C Screening  Completed  . PNA vac Low Risk Adult  Completed   Review of Systems  Constitutional: Negative for activity change, appetite change, chills, diaphoresis, fatigue, fever and unexpected weight change.  HENT: Negative for congestion, dental problem, drooling, ear discharge, ear pain, facial swelling, hearing loss, mouth sores, nosebleeds, postnasal drip, rhinorrhea, sinus pressure, sneezing, sore throat, tinnitus, trouble swallowing and voice change.   Eyes: Negative for photophobia, pain, discharge, redness, itching and visual disturbance.  Respiratory: Negative for apnea, cough, choking, chest tightness, shortness of breath, wheezing and stridor.   Cardiovascular: Negative for chest pain, palpitations and leg swelling.  Gastrointestinal: Negative for abdominal pain, blood in stool, constipation, diarrhea, nausea and vomiting.  Endocrine: Negative for cold intolerance, heat intolerance, polydipsia, polyphagia and polyuria.  Genitourinary: Negative for decreased urine volume,  difficulty urinating, discharge, dysuria, enuresis, flank pain, frequency, genital sores, hematuria, penile pain, penile swelling, scrotal swelling, testicular pain and urgency.  Musculoskeletal: Negative for arthralgias, back pain, gait problem, joint swelling, myalgias, neck pain and neck stiffness.  Skin: Negative for color change, pallor, rash and wound.  Allergic/Immunologic: Negative for environmental allergies, food allergies and immunocompromised state.  Neurological: Negative for dizziness, tremors, seizures, syncope, facial asymmetry, speech difficulty, weakness, light-headedness, numbness and headaches.  Hematological: Negative for adenopathy. Does not bruise/bleed easily.  Psychiatric/Behavioral: Negative for agitation, behavioral problems, confusion, decreased concentration, dysphoric mood, hallucinations, self-injury, sleep disturbance and suicidal ideas. The patient is not nervous/anxious and is not hyperactive.     Past Medical History:  Diagnosis Date  . Arthritis   . CAD (coronary artery disease), non obstructive by cath 07/19/11, treat medically 07/19/2011  . Crohn disease ([place])   . Hypercholesterolemia   . Hyperlipidemia   . Myocardial infarction ([place])   . Skin cancer 2006   Left shoulder; followed every six months.     Past Surgical History:  Procedure Laterality Date  . APPENDECTOMY    . CARDIAC CATHETERIZATION  07/15/2011   Medical management  . HERNIA REPAIR    . KNEE SURGERY     arthroscopy  . LEFT HEART CATHETERIZATION WITH CORONARY ANGIOGRAM  07/19/2011   Procedure: LEFT HEART CATHETERIZATION WITH CORONARY ANGIOGRAM;  Surgeon: Leonie Man, MD;  Location: [facility] CATH LAB;  Service: Cardiovascular;;  . TONSILLECTOMY     No Known Allergies Current Outpatient Medications on File Prior to Visit  Medication Sig Dispense Refill  . aspirin 81 MG tablet Take 81 mg by mouth daily.    . fluconazole (DIFLUCAN) 200 MG tablet  0  . fluticasone (FLONASE) 50 MCG/ACT nasal  spray Place into both nostrils daily.    . mesalamine (APRISO) 0.375 G 24 hr capsule Take 1,500 mg by mouth every morning. 1554m=4 capsules    . nitroGLYCERIN (NITROSTAT) 0.4 MG SL tablet Place 1 tablet (0.4 mg total) under the tongue every 5 (five) minutes x 3 doses as needed for chest pain. 25 tablet 4   No current facility-administered medications on file prior to visit.    Social History   Socioeconomic History  . Marital status: Married    Spouse name: [name] . Number of children: 2  . Years of education: Associates  . Highest education level: Not on file  Occupational History  . Occupation: BULK DRIVER    Employer: [employer]  . Financial resource strain: Not on file  . Food insecurity:    Worry: Not on file    Inability: Not on file  . Transportation needs:    Medical: Not on file    Non-medical: Not on file  Tobacco Use  . Smoking status: Former Smoker    Last attempt to quit: 07/16/1998    Years since quitting: 19.1  . Smokeless tobacco: Never Used  Substance and Sexual Activity  . Alcohol use: Yes    Alcohol/week: 4.8 oz    Types: 8 Cans of beer per week  . Drug use: No  . Sexual activity: Yes    Partners: Female    Birth control/protection: None, Post-menopausal  Lifestyle  . Physical activity:    Days per week: Not on file    Minutes per session: Not on file  . Stress: Not on file  Relationships  . Social connections:    Talks on phone: Not on file    Gets together: Not on file    Attends religious service: Not on file    Active member of club or organization: Not on file    Attends meetings of clubs or organizations: Not on file    Relationship status: Not on file  . Intimate partner violence:    Fear of current or ex partner: Not on file    Emotionally abused: Not on file    Physically abused: Not on file    Forced sexual activity: Not on file  Other Topics Concern  . 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      Lives: Lives with his wife and their pets.      Tobacco:  None; quit in 2000; smoked x 15 years + 20 years = 35 years.      Alcohol:    2-4 beers per day      Exercise: job physically demanding; mows yard.      Seatbelt: 100%; no texting   Family History  Problem Relation Age of Onset  . Cancer Mother        Breast cancer; leukemia  . Heart disease Mother 769      CABG       Objective:    BP 122/70   Pulse 69   Temp 98 F (36.7 C) (Oral)   Resp 16   Ht 5' 7.32" (1.71 m)   Wt 166 lb (75.3 kg)  SpO2 96%   BMI 25.75 kg/m  Physical Exam  Constitutional: He is oriented to person, place, and time. He appears well-developed and well-nourished. No distress.  HENT:  Head: Normocephalic and atraumatic.  Right Ear: External ear normal.  Left Ear: External ear normal.  Nose: Nose normal.  Mouth/Throat: Oropharynx is clear and moist.  Eyes: Pupils are equal, round, and reactive to light. Conjunctivae and EOM are normal.  Neck: Normal range of motion. Neck supple. Carotid bruit is not present. No thyromegaly present.  Cardiovascular: Normal rate, regular rhythm, normal heart sounds and intact distal pulses. Exam reveals no gallop and no friction rub.  No murmur heard. Pulmonary/Chest: Effort normal and breath sounds normal. He has no wheezes. He has no rales.  Abdominal: Soft. Bowel sounds are normal. He exhibits no distension and no mass. There is no tenderness. There is no rebound and no guarding. No hernia. Hernia confirmed negative in the right inguinal area and confirmed negative in the left inguinal area.  Genitourinary: Testes normal and penis normal.  Musculoskeletal:       Right shoulder: Normal.       Left shoulder: Normal.       Cervical back: Normal.  Lymphadenopathy:    He has no cervical adenopathy. No  inguinal adenopathy noted on the right or left side.  Neurological: He is alert and oriented to person, place, and time. He has normal reflexes. No cranial nerve deficit. He exhibits normal muscle tone. Coordination normal.  Skin: Skin is warm and dry. No rash noted. He is not diaphoretic.  Psychiatric: He has a normal mood and affect. His behavior is normal. Judgment and thought content normal.   No results found. Depression screen [facility] 2/9 06/14/2017 05/01/2017 05/24/2016 01/15/2016 10/07/2015  Decreased Interest 0 0 0 0 0  Down, Depressed, Hopeless 0 0 0 0 0  PHQ - 2 Score 0 0 0 0 0   Fall Risk  06/14/2017 05/01/2017 05/24/2016 01/15/2016 10/07/2015  Falls in the past year? No No No No No  Number falls in past yr: - - - - -  Injury with Fall? - - - - -   Functional Status Survey: Is the patient deaf or have difficulty hearing?: No Does the patient have difficulty seeing, even when wearing glasses/contacts?: No Does the patient have difficulty concentrating, remembering, or making decisions?: No Does the patient have difficulty walking or climbing stairs?: No Does the patient have difficulty dressing or bathing?: No Does the patient have difficulty doing errands alone such as visiting a doctor's office or shopping?: No      Assessment & Plan:   1. Routine physical examination   2. Dyslipidemia   3. Essential hypertension   4. Coronary artery disease involving native coronary artery of native heart without angina pectoris   5. Crohn's disease of small intestine without complication ([place])   6. Primary osteoarthritis of both hands   7. History of smoking   8. Prostate cancer screening     -anticipatory guidance provided --- exercise, weight loss, safe driving practices, aspirin 33m daily. -obtain age appropriate screening labs and labs for chronic disease management. -moderate fall risk; no evidence of depression; no evidence of hearing loss.  Discussed advanced directives and living  will; also discussed end of life issues including code status.  -[facility] for CAD and Crohn's disease. -refills of chronic medications provided during visit.  Orders Placed This Encounter  Procedures  . CBC with  Differential/Platelet  . Comprehensive metabolic panel    Order Specific Question:   Has the patient fasted?    Answer:   No  . Lipid panel    Order Specific Question:   Has the patient fasted?    Answer:   No  . PSA  . TSH  . Urinalysis, dipstick only   Meds ordered this encounter  Medications  . lisinopril (PRINIVIL,ZESTRIL) 20 MG tablet    Sig: Take 1 tablet (20 mg total) by mouth daily.    Dispense:  90 tablet    Refill:  1  . rosuvastatin (CRESTOR) 40 MG tablet    Sig: Take 1 tablet (40 mg total) by mouth daily.    Dispense:  90 tablet    Refill:  1    Please consider 90 day supplies to promote better adherence  . meloxicam (MOBIC) 15 MG tablet    Sig: Take 1 tablet (15 mg total) by mouth daily.    Dispense:  90 tablet    Refill:  1    Return in about 1 year (around 06/15/2018) for complete physical examiniation [place].   Keerat Denicola Elayne Guerin, M.D. Primary Care at [facility] [address] [place], [place]  [id number] [phone] phone [phone] fax

## 2017-06-14 NOTE — Patient Instructions (Addendum)
   IF you received an x-ray today, you will receive an invoice from [facility]. Please contact [facility] at [phone] with questions or concerns regarding your invoice.   IF you received labwork today, you will receive an invoice from [facility]. Please contact [facility] at [phone] with questions or concerns regarding your invoice.   Our billing staff will not be able to assist you with questions regarding bills from these companies.  You will be contacted with the lab results as soon as they are available. The fastest way to get your results is to activate your My Chart account. Instructions are located on the last page of this paperwork. If you have not heard from us regarding the results in 2 weeks, please contact this office.      Preventive Care 69 Years and Older, Male Preventive care refers to lifestyle choices and visits with your health care provider that can promote health and wellness. What does preventive care include?  A yearly physical exam. This is also called an annual well check.  Dental exams once or twice a year.  Routine eye exams. Ask your health care provider how often you should have your eyes checked.  Personal lifestyle choices, including: ? Daily care of your teeth and gums. ? Regular physical activity. ? Eating a healthy diet. ? Avoiding tobacco and drug use. ? Limiting alcohol use. ? Practicing safe sex. ? Taking low doses of aspirin every day. ? Taking vitamin and mineral supplements as recommended by your health care provider. What happens during an annual well check? The services and screenings done by your health care provider during your annual well check will depend on your age, overall health, lifestyle risk factors, and family history of disease. Counseling Your health care provider may ask you questions about your:  Alcohol use.  Tobacco use.  Drug use.  Emotional well-being.  Home and relationship  well-being.  Sexual activity.  Eating habits.  History of falls.  Memory and ability to understand (cognition).  Work and work environment.  Screening You may have the following tests or measurements:  Height, weight, and BMI.  Blood pressure.  Lipid and cholesterol levels. These may be checked every 5 years, or more frequently if you are over 50 years old.  Skin check.  Lung cancer screening. You may have this screening every year starting at age 55 if you have a 30-pack-year history of smoking and currently smoke or have quit within the past 15 years.  Fecal occult blood test (FOBT) of the stool. You may have this test every year starting at age 50.  Flexible sigmoidoscopy or colonoscopy. You may have a sigmoidoscopy every 5 years or a colonoscopy every 10 years starting at age 50.  Prostate cancer screening. Recommendations will vary depending on your family history and other risks.  Hepatitis C blood test.  Hepatitis B blood test.  Sexually transmitted disease (STD) testing.  Diabetes screening. This is done by checking your blood sugar (glucose) after you have not eaten for a while (fasting). You may have this done every 1-3 years.  Abdominal aortic aneurysm (AAA) screening. You may need this if you are a current or former smoker.  Osteoporosis. You may be screened starting at age 70 if you are at high risk.  Talk with your health care provider about your test results, treatment options, and if necessary, the need for more tests. Vaccines Your health care provider may recommend certain vaccines, such as:  Influenza vaccine. This   is recommended every year.  Tetanus, diphtheria, and acellular pertussis (Tdap, Td) vaccine. You may need a Td booster every 10 years.  Varicella vaccine. You may need this if you have not been vaccinated.  Zoster vaccine. You may need this after age 60.  Measles, mumps, and rubella (MMR) vaccine. You may need at least one dose of  MMR if you were born in 1957 or later. You may also need a second dose.  Pneumococcal 13-valent conjugate (PCV13) vaccine. One dose is recommended after age 69.  Pneumococcal polysaccharide (PPSV23) vaccine. One dose is recommended after age 69.  Meningococcal vaccine. You may need this if you have certain conditions.  Hepatitis A vaccine. You may need this if you have certain conditions or if you travel or work in places where you may be exposed to hepatitis A.  Hepatitis B vaccine. You may need this if you have certain conditions or if you travel or work in places where you may be exposed to hepatitis B.  Haemophilus influenzae type b (Hib) vaccine. You may need this if you have certain risk factors.  Talk to your health care provider about which screenings and vaccines you need and how often you need them. This information is not intended to replace advice given to you by your health care provider. Make sure you discuss any questions you have with your health care provider. Document Released: 03/06/2015 Document Revised: 10/28/2015 Document Reviewed: 12/09/2014 Elsevier Interactive Patient Education  2018 Elsevier Inc.  

## 2017-06-15 LAB — CBC WITH DIFFERENTIAL/PLATELET
Basophils Absolute: 0.1 10*3/uL (ref 0.0–0.2)
Basos: 1 %
EOS (ABSOLUTE): 0.2 10*3/uL (ref 0.0–0.4)
Eos: 3 %
Hematocrit: 38.1 % (ref 37.5–51.0)
Hemoglobin: 12.9 g/dL — ABNORMAL LOW (ref 13.0–17.7)
Immature Grans (Abs): 0 10*3/uL (ref 0.0–0.1)
Immature Granulocytes: 1 %
Lymphocytes Absolute: 2.3 10*3/uL (ref 0.7–3.1)
Lymphs: 26 %
MCH: 30.9 pg (ref 26.6–33.0)
MCHC: 33.9 g/dL (ref 31.5–35.7)
MCV: 91 fL (ref 79–97)
Monocytes Absolute: 1 10*3/uL — ABNORMAL HIGH (ref 0.1–0.9)
Monocytes: 11 %
Neutrophils Absolute: 5.2 10*3/uL (ref 1.4–7.0)
Neutrophils: 58 %
Platelets: 372 10*3/uL (ref 150–379)
RBC: 4.18 x10E6/uL (ref 4.14–5.80)
RDW: 13.8 % (ref 12.3–15.4)
WBC: 8.8 10*3/uL (ref 3.4–10.8)

## 2017-06-15 LAB — COMPREHENSIVE METABOLIC PANEL
ALT: 35 IU/L (ref 0–44)
AST: 37 IU/L (ref 0–40)
Albumin/Globulin Ratio: 1.9 (ref 1.2–2.2)
Albumin: 4.4 g/dL (ref 3.6–4.8)
Alkaline Phosphatase: 31 IU/L — ABNORMAL LOW (ref 39–117)
BUN/Creatinine Ratio: 20 (ref 10–24)
BUN: 19 mg/dL (ref 8–27)
Bilirubin Total: 0.4 mg/dL (ref 0.0–1.2)
CO2: 21 mmol/L (ref 20–29)
Calcium: 9.6 mg/dL (ref 8.6–10.2)
Chloride: 99 mmol/L (ref 96–106)
Creatinine, Ser: 0.94 mg/dL (ref 0.76–1.27)
GFR calc Af Amer: 96 mL/min/{1.73_m2} (ref >59)
GFR calc non Af Amer: 83 mL/min/{1.73_m2} (ref >59)
Globulin, Total: 2.3 g/dL (ref 1.5–4.5)
Glucose: 78 mg/dL (ref 65–99)
Potassium: 4.3 mmol/L (ref 3.5–5.2)
Sodium: 136 mmol/L (ref 134–144)
Total Protein: 6.7 g/dL (ref 6.0–8.5)

## 2017-06-15 LAB — LIPID PANEL
Chol/HDL Ratio: 2.2 ratio (ref 0.0–5.0)
Cholesterol, Total: 169 mg/dL (ref 100–199)
HDL: 77 mg/dL (ref >39)
LDL Calculated: 80 mg/dL (ref 0–99)
Triglycerides: 61 mg/dL (ref 0–149)
VLDL Cholesterol Cal: 12 mg/dL (ref 5–40)

## 2017-06-15 LAB — PSA: Prostate Specific Ag, Serum: 0.5 ng/mL (ref 0.0–4.0)

## 2017-06-15 LAB — TSH: TSH: 4.67 u[IU]/mL — ABNORMAL HIGH (ref 0.450–4.500)

## 2017-07-18 ENCOUNTER — Encounter: Payer: Self-pay | Admitting: Family Medicine

## 2017-08-20 ENCOUNTER — Encounter: Payer: Self-pay | Admitting: Family Medicine

## 2017-09-24 ENCOUNTER — Encounter: Payer: Self-pay | Admitting: Family Medicine

## 2017-10-09 ENCOUNTER — Ambulatory Visit (INDEPENDENT_AMBULATORY_CARE_PROVIDER_SITE_OTHER): Payer: BLUE CROSS/BLUE SHIELD

## 2017-10-09 ENCOUNTER — Ambulatory Visit: Payer: BLUE CROSS/BLUE SHIELD | Admitting: Family Medicine

## 2017-10-09 ENCOUNTER — Encounter: Payer: Self-pay | Admitting: Family Medicine

## 2017-10-09 ENCOUNTER — Other Ambulatory Visit: Payer: Self-pay

## 2017-10-09 VITALS — BP 162/96 | HR 74 | Temp 98.4°F | Resp 17 | Ht 67.32 in | Wt 174.2 lb

## 2017-10-09 DIAGNOSIS — M79672 Pain in left foot: Secondary | ICD-10-CM

## 2017-10-09 NOTE — Progress Notes (Signed)
Chief Complaint  Patient presents with  . elwleft top of foot pain, ankle is swollen also.      per pt helping daughter move, placed table on left foot for about 30 seconds no pain then but pain x 3 weeks and is worsening.  Pain at night 8/10, pain is not as bad during the day, unable to sleep a full night x 3 weeks, and up every hour due to pain    HPI  Patient reports 3 weeks of pain in the top of the foot He reports that he has been having swelling and pain  He reports that the pain is getting worse daily He had a broken foot injury in 1984 when he broke his ankle after stepping in to a hole Pain with flexing and extension of the foot but not with wiggling the toes He rested a very light table on top of the foot while wearing laced up gym shoes    4 review of systems  Past Medical History:  Diagnosis Date  . Arthritis   . CAD (coronary artery disease), non obstructive by cath 07/19/11, treat medically 07/19/2011  . Crohn disease ([place])   . Hypercholesterolemia   . Hyperlipidemia   . Myocardial infarction ([place])   . Skin cancer 2006   Left shoulder; followed every six months.      Current Outpatient Medications  Medication Sig Dispense Refill  . aspirin 81 MG tablet Take 81 mg by mouth daily.    . fluconazole (DIFLUCAN) 200 MG tablet   0  . fluticasone (FLONASE) 50 MCG/ACT nasal spray Place into both nostrils daily.    Marland Kitchen lisinopril (PRINIVIL,ZESTRIL) 20 MG tablet Take 1 tablet (20 mg total) by mouth daily. 90 tablet 1  . meloxicam (MOBIC) 15 MG tablet Take 1 tablet (15 mg total) by mouth daily. 90 tablet 1  . mesalamine (APRISO) 0.375 G 24 hr capsule Take 1,500 mg by mouth every morning. 1579m=4 capsules    . nitroGLYCERIN (NITROSTAT) 0.4 MG SL tablet Place 1 tablet (0.4 mg total) under the tongue every 5 (five) minutes x 3 doses as needed for chest pain. 25 tablet 4  . rosuvastatin (CRESTOR) 40 MG tablet Take 1 tablet (40 mg total) by mouth daily. 90 tablet 1   No current  facility-administered medications for this visit.     Allergies: No Known Allergies  Past Surgical History:  Procedure Laterality Date  . APPENDECTOMY    . CARDIAC CATHETERIZATION  07/15/2011   Medical management  . HERNIA REPAIR    . KNEE SURGERY     arthroscopy  . LEFT HEART CATHETERIZATION WITH CORONARY ANGIOGRAM  07/19/2011   Procedure: LEFT HEART CATHETERIZATION WITH CORONARY ANGIOGRAM;  Surgeon: DLeonie Man MD;  [facility];  Service: Cardiovascular;;  . TONSILLECTOMY      Social History   Socioeconomic History  . Marital status: Married    Spouse name: [name] . Number of children: 2  . Years of education: Associates  . Highest education level: Not on file  Occupational History  . Occupation: BULK DRIVER    Employer: [employer]  . Financial resource strain: Not on file  . Food insecurity:    Worry: Not on file    Inability: Not on file  . Transportation needs:    Medical: Not on file    Non-medical: Not on file  Tobacco Use  . Smoking status: Former Smoker    Last attempt to  quit: 07/16/1998    Years since quitting: 19.2  . Smokeless tobacco: Never Used  Substance and Sexual Activity  . Alcohol use: Yes    Alcohol/week: 8.0 standard drinks    Types: 8 Cans of beer per week  . Drug use: No  . Sexual activity: Yes    Partners: Female    Birth control/protection: None, Post-menopausal  Lifestyle  . Physical activity:    Days per week: Not on file    Minutes per session: Not on file  . Stress: Not on file  Relationships  . Social connections:    Talks on phone: Not on file    Gets together: Not on file    Attends religious service: Not on file    Active member of club or organization: Not on file    Attends meetings of clubs or organizations: Not on file    Relationship status: Not on file  Other Topics Concern  . 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      Lives: Lives with his wife and their pets.      Tobacco:  None; quit in 2000; smoked x 15 years + 20 years = 35 years.      Alcohol:    2-4 beers per day      Exercise: job physically demanding; mows yard.      Seatbelt: 100%; no texting    Family History  Problem Relation Age of Onset  . Cancer Mother        Breast cancer; leukemia  . Heart disease Mother 62       CABG     ROS Review of Systems See HPI Constitution: No fevers or chills No malaise No diaphoresis Skin: No rash or itching Eyes: no blurry vision, no double vision GU: no dysuria or hematuria Neuro: no dizziness or headaches * all others reviewed and negative   Objective: Vitals:   10/09/17 1209  BP: (!) 162/96  Pulse: 74  Resp: 17  Temp: 98.4 F (36.9 C)  TempSrc: Oral  SpO2: 97%  Weight: 174 lb 3.2 oz (79 kg)  Height: 5' 7.32" (1.71 m)    Physical Exam  Constitutional: He is oriented to person, place, and time. He appears well-developed and well-nourished.  HENT:  Head: Normocephalic and atraumatic.  Eyes: Conjunctivae and EOM are normal.  Pulmonary/Chest: Effort normal.  Musculoskeletal:       Left foot: There is tenderness and bony tenderness. There is normal range of motion, no swelling, normal capillary refill, no crepitus, no deformity and no laceration.       Feet:  Neurological: He is alert and oriented to person, place, and time.  Psychiatric: He has a normal mood and affect. His behavior is normal. Judgment and thought content normal.    Assessment and Plan Cortlin was seen today for elwleft top of foot pain, ankle is swollen also.  .  Diagnoses and all orders for this visit:  Left foot pain -     DG Foot Complete Left; Future  will evaluate for xray    [place] Gavitt A Nolon Rod

## 2017-10-09 NOTE — Patient Instructions (Addendum)
     If you have lab work done today you will be contacted with your lab results within the next 2 weeks.  If you have not heard from [place] then please contact us. The fastest way to get your results is to register for My Chart.   IF you received an x-ray today, you will receive an invoice from [facility]. Please contact [facility] at [phone] with questions or concerns regarding your invoice.   IF you received labwork today, you will receive an invoice from [place]. Please contact [facility] at [phone] with questions or concerns regarding your invoice.   Our billing staff will not be able to assist you with questions regarding bills from these companies.  You will be contacted with the lab results as soon as they are available. The fastest way to get your results is to activate your My Chart account. Instructions are located on the last page of this paperwork. If you have not heard from [place] regarding the results in 2 weeks, please contact this office.     Foot Pain Many things can cause foot pain. Some common causes are:  An injury.  A sprain.  Arthritis.  Blisters.  Bunions.  Follow these instructions at home: Pay attention to any changes in your symptoms. Take these actions to help with your discomfort:  If directed, put ice on the affected area: ? Put ice in a plastic bag. ? Place a towel between your skin and the bag. ? Leave the ice on for 15-20 minutes, 3?4 times a day for 2 days.  Take over-the-counter and prescription medicines only as told by your health care provider.  Wear comfortable, supportive shoes that fit you well. Do not wear high heels.  Do not stand or walk for long periods of time.  Do not lift a lot of weight. This can put added pressure on your feet.  Do stretches to relieve foot pain and stiffness as told by your health care provider.  Rub your foot gently.  Keep your feet clean and dry.  Contact a health care  provider if:  Your pain does not get better after a few days of self-care.  Your pain gets worse.  You cannot stand on your foot. Get help right away if:  Your foot is numb or tingling.  Your foot or toes are swollen.  Your foot or toes turn white or blue.  You have warmth and redness along your foot. This information is not intended to replace advice given to you by your health care provider. Make sure you discuss any questions you have with your health care provider. Document Released: 03/06/2015 Document Revised: 07/16/2015 Document Reviewed: 03/05/2014 Elsevier Interactive Patient Education  [employer].

## 2017-10-14 ENCOUNTER — Encounter: Payer: Self-pay | Admitting: Family Medicine

## 2017-10-14 ENCOUNTER — Telehealth: Payer: Self-pay | Admitting: Family Medicine

## 2017-10-14 ENCOUNTER — Ambulatory Visit: Payer: BLUE CROSS/BLUE SHIELD | Admitting: Family Medicine

## 2017-10-14 VITALS — BP 138/88 | HR 58 | Temp 98.3°F | Resp 16 | Ht 67.0 in | Wt 176.4 lb

## 2017-10-14 DIAGNOSIS — I2510 Atherosclerotic heart disease of native coronary artery without angina pectoris: Secondary | ICD-10-CM | POA: Diagnosis not present

## 2017-10-14 DIAGNOSIS — M19042 Primary osteoarthritis, left hand: Secondary | ICD-10-CM

## 2017-10-14 DIAGNOSIS — I10 Essential (primary) hypertension: Secondary | ICD-10-CM | POA: Diagnosis not present

## 2017-10-14 DIAGNOSIS — M79672 Pain in left foot: Secondary | ICD-10-CM

## 2017-10-14 DIAGNOSIS — M779 Enthesopathy, unspecified: Secondary | ICD-10-CM

## 2017-10-14 DIAGNOSIS — M778 Other enthesopathies, not elsewhere classified: Secondary | ICD-10-CM

## 2017-10-14 DIAGNOSIS — M19041 Primary osteoarthritis, right hand: Secondary | ICD-10-CM

## 2017-10-14 MED ORDER — TRIAMCINOLONE ACETONIDE 40 MG/ML IJ SUSP: 40.0000 mg | Freq: Once | INTRAMUSCULAR | Status: DC

## 2017-10-14 MED ORDER — LISINOPRIL 20 MG PO TABS: 20.0000 mg | ORAL_TABLET | Freq: Every day | ORAL | 1 refills | Status: DC

## 2017-10-14 MED ORDER — ROSUVASTATIN CALCIUM 40 MG PO TABS: 40.0000 mg | ORAL_TABLET | Freq: Every day | ORAL | 1 refills | Status: DC

## 2017-10-14 MED ORDER — LIDOCAINE-EPINEPHRINE 1 %-1:100000 IJ SOLN: 2.0000 mL | Freq: Once | INTRAMUSCULAR | Status: DC

## 2017-10-14 MED ORDER — MELOXICAM 15 MG PO TABS: 15.0000 mg | ORAL_TABLET | Freq: Every day | ORAL | 1 refills | Status: DC

## 2017-10-14 MED ORDER — MESALAMINE ER 0.375 G PO CP24: 1500.0000 mg | ORAL_CAPSULE | Freq: Every morning | ORAL | 1 refills | Status: DC

## 2017-10-14 NOTE — Progress Notes (Signed)
Chief Complaint  Patient presents with  . steroid injection    getting a steroid injection in his left foot    HPI  He reports that he has been having pain from arthritis of the left knee He takes meloxicam daily for pain He reports that his pain in the left foot improved from 8/10  His pain is right now just throbbing but at night the pain goes back up to 7/10 He reports that he cannot push the lawnmower He feels like his pain is better when he rests  Hypertension BP Readings from Last 3 Encounters:  10/14/17 138/88  10/09/17 (!) 162/96  06/14/17 122/70   He reports that he took his bp medication yesterday around 2pm He states that he gets white coat hypertension    Past Medical History:  Diagnosis Date  . Arthritis   . CAD (coronary artery disease), non obstructive by cath 07/19/11, treat medically 07/19/2011  . Crohn disease ([place])   . Hypercholesterolemia   . Hyperlipidemia   . Myocardial infarction ([place])   . Skin cancer 2006   Left shoulder; followed every six months.      Current Outpatient Medications  Medication Sig Dispense Refill  . aspirin 81 MG tablet Take 81 mg by mouth daily.    . fluticasone (FLONASE) 50 MCG/ACT nasal spray Place into both nostrils daily.    Marland Kitchen lisinopril (PRINIVIL,ZESTRIL) 20 MG tablet Take 1 tablet (20 mg total) by mouth daily. 90 tablet 1  . meloxicam (MOBIC) 15 MG tablet Take 1 tablet (15 mg total) by mouth daily. 90 tablet 1  . mesalamine (APRISO) 0.375 g 24 hr capsule Take 4 capsules (1.5 g total) by mouth every morning. 15106m=4 capsules 360 capsule 1  . nitroGLYCERIN (NITROSTAT) 0.4 MG SL tablet Place 1 tablet (0.4 mg total) under the tongue every 5 (five) minutes x 3 doses as needed for chest pain. 25 tablet 4  . rosuvastatin (CRESTOR) 40 MG tablet Take 1 tablet (40 mg total) by mouth daily. 90 tablet 1   No current facility-administered medications for this visit.     Allergies: No Known Allergies  Past Surgical History:    Procedure Laterality Date  . APPENDECTOMY    . CARDIAC CATHETERIZATION  07/15/2011   Medical management  . HERNIA REPAIR    . KNEE SURGERY     arthroscopy  . LEFT HEART CATHETERIZATION WITH CORONARY ANGIOGRAM  07/19/2011   Procedure: LEFT HEART CATHETERIZATION WITH CORONARY ANGIOGRAM;  Surgeon: DLeonie Man MD;  [facility];  Service: Cardiovascular;;  . TONSILLECTOMY      Social History   Socioeconomic History  . Marital status: Married    Spouse name: [name] . Number of children: 2  . Years of education: Associates  . Highest education level: Not on file  Occupational History  . Occupation: BULK DRIVER    Employer: [employer]  . Financial resource strain: Not on file  . Food insecurity:    Worry: Not on file    Inability: Not on file  . Transportation needs:    Medical: Not on file    Non-medical: Not on file  Tobacco Use  . Smoking status: Former Smoker    Last attempt to quit: 07/16/1998    Years since quitting: 19.2  . Smokeless tobacco: Never Used  Substance and Sexual Activity  . Alcohol use: Yes    Alcohol/week: 8.0 standard drinks    Types: 8 Cans of beer  per week  . Drug use: No  . Sexual activity: Yes    Partners: Female    Birth control/protection: None, Post-menopausal  Lifestyle  . Physical activity:    Days per week: Not on file    Minutes per session: Not on file  . Stress: Not on file  Relationships  . Social connections:    Talks on phone: Not on file    Gets together: Not on file    Attends religious service: Not on file    Active member of club or organization: Not on file    Attends meetings of clubs or organizations: Not on file    Relationship status: Not on file  Other Topics Concern  . 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       Lives: Lives with his wife and their pets.      Tobacco:  None; quit in 2000; smoked x 15 years + 20 years = 35 years.      Alcohol:    2-4 beers per day      Exercise: job physically demanding; mows yard.      Seatbelt: 100%; no texting    Family History  Problem Relation Age of Onset  . Cancer Mother        Breast cancer; leukemia  . Heart disease Mother 63       CABG     ROS Review of Systems See HPI Constitution: No fevers or chills No malaise No diaphoresis Skin: No rash or itching Eyes: no blurry vision, no double vision GU: no dysuria or hematuria Neuro: no dizziness or headaches all others reviewed and negative   Objective: Vitals:   10/14/17 0918 10/14/17 0937  BP: (!) 174/61 138/88  Pulse: (!) 58   Resp: 16   Temp: 98.3 F (36.8 C)   TempSrc: Oral   SpO2: 98%   Weight: 176 lb 6.4 oz (80 kg)   Height: 5' 7"  (1.702 m)     Physical Exam  Constitutional: He is oriented to person, place, and time. He appears well-developed and well-nourished.  HENT:  Head: Normocephalic and atraumatic.  Eyes: Conjunctivae and EOM are normal.  Neck: Normal range of motion. Neck supple.  Cardiovascular: Normal rate, regular rhythm and normal heart sounds.  No murmur heard. Pulmonary/Chest: Effort normal and breath sounds normal. No stridor. No respiratory distress. He has no wheezes. He has no rales. He exhibits no tenderness.  Musculoskeletal:       Feet:  Neurological: He is alert and oriented to person, place, and time.  Skin: Skin is warm. Capillary refill takes less than 2 seconds.  Psychiatric: He has a normal mood and affect. His behavior is normal. Judgment and thought content normal.    CLINICAL DATA:  Left foot pain today.  No known injury.  EXAM: LEFT FOOT - COMPLETE 3+ VIEW  COMPARISON:  None.  FINDINGS: No acute bony or joint abnormality is identified. Moderate to moderately severe first MTP osteoarthritis and osteoarthritis about the IP  joint of the great toe is identified. Minimal talonavicular degenerative change is seen. There is a very small plantar calcaneal spur. Atherosclerotic calcifications are noted.  IMPRESSION: No acute abnormality.  Moderate to moderately severe osteoarthritis IP joint of the  great toe and first MTP joint.   Electronically Signed   By: Inge Rise M.D.   On: 10/09/2017 12:50   Lab Results  Component Value Date   CREATININE 0.94 06/14/2017     Assessment and Plan Harriet was seen today for steroid injection.  Diagnoses and all orders for this visit:  Left foot pain Flexor hallucis longus tendonitis -     Discontinue: triamcinolone acetonide (KENALOG-40) injection 40 mg -     Discontinue: lidocaine-EPINEPHrine (XYLOCAINE W/EPI) 1 %-1:100000 (with pres) injection 2 mL  Pt improving Continue rest and elevation for tendonitis Discussed continuing meloxicam for arthritis Topical aspercreme with lidocaine  Hypertension -  Continue lisinopril   Osteoarthritis -  Continue meloxicam, reviewed xray, referral placed for Rheumatology given multiple joint involvement and some deformity of the bones of the foot  CAD - discussed statin use    Chaeli Judy A Yvonnia Tango

## 2017-10-14 NOTE — Telephone Encounter (Signed)
Pt was seen today by Nolon Rod for arthritis in his foot.  He is calling back to see if she could put in a referral for [facility] rheumatology.  His wife is also a patient there.

## 2017-10-14 NOTE — Patient Instructions (Addendum)
Topical relief medication - use aspercreme with lidocaine   If you have lab work done today you will be contacted with your lab results within the next 2 weeks.  If you have not heard from [place] then please contact us. The fastest way to get your results is to register for My Chart.   IF you received an x-ray today, you will receive an invoice from [facility]. Please contact [facility] at [phone] with questions or concerns regarding your invoice.   IF you received labwork today, you will receive an invoice from [place]. Please contact [facility] at [phone] with questions or concerns regarding your invoice.   Our billing staff will not be able to assist you with questions regarding bills from these companies.  You will be contacted with the lab results as soon as they are available. The fastest way to get your results is to activate your My Chart account. Instructions are located on the last page of this paperwork. If you have not heard from [place] regarding the results in 2 weeks, please contact this office.      Tendinitis Tendinitis is inflammation of a tendon. A tendon is a strong cord of tissue that connects muscle to bone. Tendinitis can affect any tendon, but it most commonly affects the shoulder tendon (rotator cuff), ankle tendon (Achilles tendon), elbow tendon (triceps tendon), or one of the tendons in the wrist. What are the causes? This condition may be caused by:  Overusing a tendon or muscle. This is common.  Age-related wear and tear.  Injury.  Inflammatory conditions, such as arthritis.  Certain medicines.  What increases the risk? This condition is more likely to develop in people who do activities that involve repetitive motions. What are the signs or symptoms? Symptoms of this condition may include:  Pain.  Tenderness.  Mild swelling.  How is this diagnosed? This condition is diagnosed with a physical exam. You may also have tests,  such as:  Ultrasound. This uses sound waves to make an image of your affected area.  MRI.  How is this treated? This condition may be treated by resting, icing, applying pressure (compression), and raising (elevating) the area above the level of your heart. This is known as RICE therapy. Treatment may also include:  Medicines to help reduce inflammation or to help reduce pain.  Exercises or physical therapy to strengthen and stretch the tendon.  A brace or splint.  Surgery (rare).  Follow these instructions at home:  If you have a splint or brace:  Wear the splint or brace as told by your health care provider. Remove it only as told by your health care provider.  Loosen the splint or brace if your fingers or toes tingle, become numb, or turn cold and blue.  Do not take baths, swim, or use a hot tub until your health care provider approves. Ask your health care provider if you can take showers. You may only be allowed to take sponge baths for bathing.  Do not let your splint or brace get wet if it is not waterproof. ? If your splint or brace is not waterproof, cover it with a watertight plastic bag when you take a bath or a shower.  Keep the splint or brace clean. Managing pain, stiffness, and swelling  If directed, apply ice to the affected area. ? Put ice in a plastic bag. ? Place a towel between your skin and the bag. ? Leave the ice on for 20 minutes, 2-3  times a day.  If directed, apply heat to the affected area as often as told by your health care provider. Use the heat source that your health care provider recommends, such as a moist heat pack or a heating pad. ? Place a towel between your skin and the heat source. ? Leave the heat on for 20-30 minutes. ? Remove the heat if your skin turns bright red. This is especially important if you are unable to feel pain, heat, or cold. You may have a greater risk of getting burned.  Move the fingers or toes of the affected limb  often, if this applies. This can help to prevent stiffness and lessen swelling.  If directed, elevate the affected area above the level of your heart while you are sitting or lying down. Driving  Do not drive or operate heavy machinery while taking prescription pain medicine.  Ask your health care provider when it is safe to drive if you have a splint or brace on any part of your arm or leg. Activity  Return to your normal activities as told by your health care provider. Ask your health care provider what activities are safe for you.  Rest the affected area as told by your health care provider.  Avoid using the affected area while you are experiencing symptoms of tendinitis.  Do exercises as told by your health care provider. General instructions  If you have a splint, do not put pressure on any part of the splint until it is fully hardened. This may take several hours.  Wear an elastic bandage or compression wrap only as told by your health care provider.  Take over-the-counter and prescription medicines only as told by your health care provider.  Keep all follow-up visits as told by your health care provider. This is important. Contact a health care provider if:  Your symptoms do not improve.  You develop new, unexplained problems, such as numbness in your hands. This information is not intended to replace advice given to you by your health care provider. Make sure you discuss any questions you have with your health care provider. Document Released: 02/05/2000 Document Revised: 10/08/2015 Document Reviewed: 11/10/2014 Elsevier Interactive Patient Education  [employer].

## 2018-03-29 ENCOUNTER — Telehealth: Payer: Self-pay | Admitting: Cardiovascular Disease

## 2018-03-29 NOTE — Telephone Encounter (Signed)
New Message        Pt c/o BP issue:  1. What are your last 5 BP readings? 181/96 Yesterday 171/103 today 172/109 163/96 today 2. Are you having any other symptoms (ex. Dizziness, headache, blurred vision, passed out)? No  3. What is your medication issue? No medication issue

## 2018-03-29 NOTE — Telephone Encounter (Signed)
Spoke with pt and has noted for quite sometime elevated B/P ranging 164-181/96-109 over the last couple of days Per pt feels fine has retired and has gained almost 20 lbs Reviewed pt's chart and is over due for appt Appt made with Dr Gwenlyn Found for tom at 10:30 am Will forward to Dr Gwenlyn Found for review .Adonis Housekeeper

## 2018-03-29 NOTE — Telephone Encounter (Signed)
That is fine with me.

## 2018-03-29 NOTE — Telephone Encounter (Signed)
Pt aware to keep appt tom ./cy

## 2018-03-30 ENCOUNTER — Ambulatory Visit: Payer: BLUE CROSS/BLUE SHIELD | Admitting: Cardiovascular Disease

## 2018-03-30 ENCOUNTER — Encounter: Payer: Self-pay | Admitting: Cardiovascular Disease

## 2018-03-30 VITALS — BP 170/96 | HR 62 | Ht 67.5 in | Wt 187.4 lb

## 2018-03-30 DIAGNOSIS — I10 Essential (primary) hypertension: Secondary | ICD-10-CM | POA: Diagnosis not present

## 2018-03-30 DIAGNOSIS — I2510 Atherosclerotic heart disease of native coronary artery without angina pectoris: Secondary | ICD-10-CM | POA: Diagnosis not present

## 2018-03-30 DIAGNOSIS — E785 Hyperlipidemia, unspecified: Secondary | ICD-10-CM | POA: Diagnosis not present

## 2018-03-30 MED ORDER — AMLODIPINE BESYLATE 5 MG PO TABS: 5.0000 mg | ORAL_TABLET | Freq: Every day | ORAL | 3 refills | Status: DC

## 2018-03-30 NOTE — Assessment & Plan Note (Signed)
History of dyslipidemia on high-dose Crestor with recent lipid profile performed 06/14/2017 revealing total cholesterol 169, LDL of 80 and HDL of 77.

## 2018-03-30 NOTE — Patient Instructions (Addendum)
Medication Instructions:  START AMLODIPINE 5 MG, ONE TABLET BY MOUTH DAILY  If you need a refill on your cardiac medications before your next appointment, please call your pharmacy.   Lab work: NONE If you have labs (blood work) drawn today and your tests are completely normal, you will receive your results only by: Marland Kitchen MyChart Message (if you have MyChart) OR . A paper copy in the mail If you have any lab test that is abnormal or we need to change your treatment, we will call you to review the results.  Testing/Procedures: NONE  Follow-Up: At [facility], you and your health needs are our priority.  As part of our continuing mission to provide you with exceptional heart care, we have created designated Provider Care Teams.  These Care Teams include your primary Cardiologist (physician) and Advanced Practice Providers (APPs -  Physician Assistants and Nurse Practitioners) who all work together to provide you with the care you need, when you need it. . You will need a follow up appointment in 12 months.  Please call our office 2 months in advance to schedule this appointment.  You may see Dr. Gwenlyn Found or one of the following Advanced Practice Providers on your designated Care Team:   . Kerin Ransom, [place] . Almyra Deforest, PA-C . Fabian Sharp, PA-C . Jory Sims, DNP . Rosaria Ferries, PA-C . Roby Lofts, PA-C . Sande Rives, PA-C  Any Other Special Instructions Will Be Listed Below (If Applicable). KEEP A DAILY BLOOD PRESSURE LOG FOR 30 DAYS THEN FOLLOW UP WITH A CLINICAL PHARMACIST IN THE HEARTCARE HYPERTENSION CLINIC    DASH Eating Plan DASH stands for "Dietary Approaches to Stop Hypertension." The DASH eating plan is a healthy eating plan that has been shown to reduce high blood pressure (hypertension). It may also reduce your risk for type 2 diabetes, heart disease, and stroke. The DASH eating plan may also help with weight loss. What are tips for following this plan?  General  guidelines  Avoid eating more than 2,300 mg (milligrams) of salt (sodium) a day. If you have hypertension, you may need to reduce your sodium intake to 1,500 mg a day.  Limit alcohol intake to no more than 1 drink a day for nonpregnant women and 2 drinks a day for men. One drink equals 12 oz of beer, 5 oz of wine, or 1 oz of hard liquor.  Work with your health care provider to maintain a healthy body weight or to lose weight. Ask what an ideal weight is for you.  Get at least 30 minutes of exercise that causes your heart to beat faster (aerobic exercise) most days of the week. Activities may include walking, swimming, or biking.  Work with your health care provider or diet and nutrition specialist (dietitian) to adjust your eating plan to your individual calorie needs. Reading food labels   Check food labels for the amount of sodium per serving. Choose foods with less than 5 percent of the Daily Value of sodium. Generally, foods with less than 300 mg of sodium per serving fit into this eating plan.  To find whole grains, look for the word "whole" as the first word in the ingredient list. Shopping  Buy products labeled as "low-sodium" or "no salt added."  Buy fresh foods. Avoid canned foods and premade or frozen meals. Cooking  Avoid adding salt when cooking. Use salt-free seasonings or herbs instead of table salt or sea salt. Check with your health care provider or pharmacist  before using salt substitutes.  Do not fry foods. Cook foods using healthy methods such as baking, boiling, grilling, and broiling instead.  Cook with heart-healthy oils, such as olive, canola, soybean, or sunflower oil. Meal planning  Eat a balanced diet that includes: ? 5 or more servings of fruits and vegetables each day. At each meal, try to fill half of your plate with fruits and vegetables. ? Up to 6-8 servings of whole grains each day. ? Less than 6 oz of lean meat, poultry, or fish each day. A 3-oz  serving of meat is about the same size as a deck of cards. One egg equals 1 oz. ? 2 servings of low-fat dairy each day. ? A serving of nuts, seeds, or beans 5 times each week. ? Heart-healthy fats. Healthy fats called Omega-3 fatty acids are found in foods such as flaxseeds and coldwater fish, like sardines, salmon, and mackerel.  Limit how much you eat of the following: ? Canned or prepackaged foods. ? Food that is high in trans fat, such as fried foods. ? Food that is high in saturated fat, such as fatty meat. ? Sweets, desserts, sugary drinks, and other foods with added sugar. ? Full-fat dairy products.  Do not salt foods before eating.  Try to eat at least 2 vegetarian meals each week.  Eat more home-cooked food and less restaurant, buffet, and fast food.  When eating at a restaurant, ask that your food be prepared with less salt or no salt, if possible. What foods are recommended? The items listed may not be a complete list. Talk with your dietitian about what dietary choices are best for you. Grains Whole-grain or whole-wheat bread. Whole-grain or whole-wheat pasta. Brown rice. Modena Morrow. Bulgur. Whole-grain and low-sodium cereals. Pita bread. Low-fat, low-sodium crackers. Whole-wheat flour tortillas. Vegetables Fresh or frozen vegetables (raw, steamed, roasted, or grilled). Low-sodium or reduced-sodium tomato and vegetable juice. Low-sodium or reduced-sodium tomato sauce and tomato paste. Low-sodium or reduced-sodium canned vegetables. Fruits All fresh, dried, or frozen fruit. Canned fruit in natural juice (without added sugar). Meat and other protein foods Skinless chicken or [place]. Ground chicken or [place]. Pork with fat trimmed off. Fish and seafood. Egg whites. Dried beans, peas, or lentils. Unsalted nuts, nut butters, and seeds. Unsalted canned beans. Lean cuts of beef with fat trimmed off. Low-sodium, lean deli meat. Dairy Low-fat (1%) or fat-free (skim) milk.  Fat-free, low-fat, or reduced-fat cheeses. Nonfat, low-sodium ricotta or cottage cheese. Low-fat or nonfat yogurt. Low-fat, low-sodium cheese. Fats and oils Soft margarine without trans fats. Vegetable oil. Low-fat, reduced-fat, or light mayonnaise and salad dressings (reduced-sodium). Canola, safflower, olive, soybean, and sunflower oils. Avocado. Seasoning and other foods Herbs. Spices. Seasoning mixes without salt. Unsalted popcorn and pretzels. Fat-free sweets. What foods are not recommended? The items listed may not be a complete list. Talk with your dietitian about what dietary choices are best for you. Grains Baked goods made with fat, such as croissants, muffins, or some breads. Dry pasta or rice meal packs. Vegetables Creamed or fried vegetables. Vegetables in a cheese sauce. Regular canned vegetables (not low-sodium or reduced-sodium). Regular canned tomato sauce and paste (not low-sodium or reduced-sodium). Regular tomato and vegetable juice (not low-sodium or reduced-sodium). Angie Fava. Olives. Fruits Canned fruit in a light or heavy syrup. Fried fruit. Fruit in cream or butter sauce. Meat and other protein foods Fatty cuts of meat. Ribs. Fried meat. Berniece Salines. Sausage. Bologna and other processed lunch meats. Salami. Fatback. Hotdogs. Bratwurst. Salted nuts  and seeds. Canned beans with added salt. Canned or smoked fish. Whole eggs or egg yolks. Chicken or [place] with skin. Dairy Whole or 2% milk, cream, and half-and-half. Whole or full-fat cream cheese. Whole-fat or sweetened yogurt. Full-fat cheese. Nondairy creamers. Whipped toppings. Processed cheese and cheese spreads. Fats and oils Butter. Stick margarine. Lard. Shortening. Ghee. Bacon fat. Tropical oils, such as coconut, palm kernel, or palm oil. Seasoning and other foods Salted popcorn and pretzels. Onion salt, garlic salt, seasoned salt, table salt, and sea salt. Worcestershire sauce. Tartar sauce. Barbecue sauce. Teriyaki sauce.  Soy sauce, including reduced-sodium. Steak sauce. Canned and packaged gravies. Fish sauce. Oyster sauce. Cocktail sauce. Horseradish that you find on the shelf. Ketchup. Mustard. Meat flavorings and tenderizers. Bouillon cubes. Hot sauce and Tabasco sauce. Premade or packaged marinades. Premade or packaged taco seasonings. Relishes. Regular salad dressings. Where to find more information:  National Heart, Lung, and [place]: https://wilson-eaton.com/  American Heart Association: www.heart.org Summary  The DASH eating plan is a healthy eating plan that has been shown to reduce high blood pressure (hypertension). It may also reduce your risk for type 2 diabetes, heart disease, and stroke.  With the DASH eating plan, you should limit salt (sodium) intake to 2,300 mg a day. If you have hypertension, you may need to reduce your sodium intake to 1,500 mg a day.  When on the DASH eating plan, aim to eat more fresh fruits and vegetables, whole grains, lean proteins, low-fat dairy, and heart-healthy fats.  Work with your health care provider or diet and nutrition specialist (dietitian) to adjust your eating plan to your individual calorie needs. This information is not intended to replace advice given to you by your health care provider. Make sure you discuss any questions you have with your health care provider. Document Released: 01/27/2011 Document Revised: 02/01/2016 Document Reviewed: 02/01/2016 Elsevier Interactive Patient Education  2019 [employer].

## 2018-03-30 NOTE — Assessment & Plan Note (Signed)
History of essential hypertension with blood pressure measured today at 170/96.  He does admit to dietary indiscretion with a cart to salt.  He is gained 20 pounds since he retired in May of last year.  We talked about salt restriction.  He is already on lisinopril 20 mg a day.  I am going to add amlodipine 5 mg a day and we will have him keep a blood pressure log.  We will see Frederick Rush back in 1 month and me back in 1 year.

## 2018-03-30 NOTE — Assessment & Plan Note (Signed)
History of noncritical CAD by cath performed 07/15/2011.  He denies chest pain or shortness of breath.

## 2018-03-30 NOTE — Progress Notes (Signed)
03/30/2018 RUNE MENDEZ   [birth year]  [id number]  Primary Physician Forrest Moron, MD Primary Cardiologist: Lorretta Harp MD FACP, [place], [place], [place]  HPI:  Frederick Rush is a 68 y.o.  mildly overweight, married, Caucasian male, father of two, grandfather to two grandchildren, who I last saw in the office  12/07/2016 He has a history of noncritical CAD found on cath performed by Dr. Ula Lingo on Jul 15, 2011. His other problems include hyperlipidemia and remote tobacco abuse.   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  Since that time he is gained 20 pounds.  Blood pressure has been more difficult to control.  Does admit to dietary indiscretion with regards to salt.  He denies chest pain or shortness of breath.   Current Meds  Medication Sig  . aspirin 81 MG tablet Take 81 mg by mouth daily.  . fluticasone (FLONASE) 50 MCG/ACT nasal spray Place into both nostrils daily.  Marland Kitchen lisinopril (PRINIVIL,ZESTRIL) 20 MG tablet Take 1 tablet (20 mg total) by mouth daily.  . meloxicam (MOBIC) 15 MG tablet Take 1 tablet (15 mg total) by mouth daily.  . mesalamine (APRISO) 0.375 g 24 hr capsule Take 4 capsules (1.5 g total) by mouth every morning. 1535m=4 capsules  . nitroGLYCERIN (NITROSTAT) 0.4 MG SL tablet Place 1 tablet (0.4 mg total) under the tongue every 5 (five) minutes x 3 doses as needed for chest pain.  . rosuvastatin (CRESTOR) 40 MG tablet Take 1 tablet (40 mg total) by mouth daily.     No Known Allergies  Social History   Socioeconomic History  . Marital status: Married    Spouse name: [name] . Number of children: 2  . Years of education: Associates  . Highest education level: Not on file  Occupational History  . Occupation: BULK DRIVER    Employer: [employer]  . Financial resource strain: Not on file  . Food insecurity:    Worry: Not on file    Inability: Not on file  .  Transportation needs:    Medical: Not on file    Non-medical: Not on file  Tobacco Use  . Smoking status: Former Smoker    Last attempt to quit: 07/16/1998    Years since quitting: 19.7  . Smokeless tobacco: Never Used  Substance and Sexual Activity  . Alcohol use: Yes    Alcohol/week: 8.0 standard drinks    Types: 8 Cans of beer per week  . Drug use: No  . Sexual activity: Yes    Partners: Female    Birth control/protection: None, Post-menopausal  Lifestyle  . Physical activity:    Days per week: Not on file    Minutes per session: Not on file  . Stress: Not on file  Relationships  . Social connections:    Talks on phone: Not on file    Gets together: Not on file    Attends religious service: Not on file    Active member of club or organization: Not on file    Attends meetings of clubs or organizations: Not on file    Relationship status: Not on file  . Intimate partner violence:    Fear of current or ex partner: Not on file    Emotionally abused: Not on file    Physically abused: Not on file    Forced sexual activity: Not  on file  Other Topics Concern  . 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      Lives: Lives with his wife and their pets.      Tobacco:  None; quit in 2000; smoked x 15 years + 20 years = 35 years.      Alcohol:    2-4 beers per day      Exercise: job physically demanding; mows yard.      Seatbelt: 100%; no texting     Review of Systems: General: negative for chills, fever, night sweats or weight changes.  Cardiovascular: negative for chest pain, dyspnea on exertion, edema, orthopnea, palpitations, paroxysmal nocturnal dyspnea or shortness of breath Dermatological: negative for rash Respiratory: negative for cough or wheezing Urologic: negative for hematuria Abdominal: negative for  nausea, vomiting, diarrhea, bright red blood per rectum, melena, or hematemesis Neurologic: negative for visual changes, syncope, or dizziness All other systems reviewed and are otherwise negative except as noted above.    Blood pressure (!) 170/96, pulse 62, height 5' 7.5" (1.715 m), weight 187 lb 6.4 oz (85 kg).  General appearance: alert and no distress Neck: no adenopathy, no carotid bruit, no JVD, supple, symmetrical, trachea midline and thyroid not enlarged, symmetric, no tenderness/mass/nodules Lungs: clear to auscultation bilaterally Heart: regular rate and rhythm, S1, S2 normal, no murmur, click, rub or gallop Extremities: extremities normal, atraumatic, no cyanosis or edema Pulses: 2+ and symmetric Skin: Skin color, texture, turgor normal. No rashes or lesions Neurologic: Alert and oriented X 3, normal strength and tone. Normal symmetric reflexes. Normal coordination and gait  EKG sinus rhythm at 62 without ST or T wave changes.  I personally reviewed this EKG.  ASSESSMENT AND PLAN:   Dyslipidemia History of dyslipidemia on high-dose Crestor with recent lipid profile performed 06/14/2017 revealing total cholesterol 169, LDL of 80 and HDL of 77.  CAD (coronary artery disease), non obstructive by cath 07/19/11, treat medically History of noncritical CAD by cath performed 07/15/2011.  He denies chest pain or shortness of breath.  Essential hypertension History of essential hypertension with blood pressure measured today at 170/96.  He does admit to dietary indiscretion with a cart to salt.  He is gained 20 pounds since he retired in May of last year.  We talked about salt restriction.  He is already on lisinopril 20 mg a day.  I am going to add amlodipine 5 mg a day and we will have him keep a blood pressure log.  We will see Cyril Mourning back in 1 month and me back in 1 year.      Lorretta Harp MD FACP,FACC,FAHA, [facility] 03/30/2018 11:04 AM

## 2018-04-16 ENCOUNTER — Other Ambulatory Visit: Payer: Self-pay | Admitting: Family Medicine

## 2018-04-16 DIAGNOSIS — M19041 Primary osteoarthritis, right hand: Secondary | ICD-10-CM

## 2018-04-16 DIAGNOSIS — M19042 Primary osteoarthritis, left hand: Principal | ICD-10-CM

## 2018-04-16 NOTE — Telephone Encounter (Signed)
Patient called and advised he will need an appointment for refill. He says that he has enough medications to last until April when he's due for a physical. Appointment scheduled for Monday, 06/18/18 at 0900 with Dr. Nolon Rod.

## 2018-04-27 ENCOUNTER — Ambulatory Visit (INDEPENDENT_AMBULATORY_CARE_PROVIDER_SITE_OTHER): Payer: BLUE CROSS/BLUE SHIELD | Admitting: Pharmacist Clinician (PhC)/ Clinical Pharmacy Specialist

## 2018-04-27 DIAGNOSIS — I10 Essential (primary) hypertension: Secondary | ICD-10-CM | POA: Diagnosis not present

## 2018-04-27 NOTE — Progress Notes (Signed)
04/30/2018 Frederick Rush [birth year] [id number]   HPI:  Frederick Rush is a 68 y.o. male patient of Dr Gwenlyn Found, with a PMH below who presents today for hypertension clinic evaluation.  In addition to hypertension, his medical history is significant for non-obstructive CAD, Crohn's disease and hyperlipidemia.  He recently saw Dr. Gwenlyn Found on Feb 7 and was found to have a blood pressure of 170/96.   He notes a 20 pound weight gain since stopping work last May and admits to not watching salt intake.    Today he is here for hypertension evaluation.  He has no complaints of chest pain.  He does note occasional shortness of breath, and believes it may have to do with weight gains.  He recently joined a gym in hopes of losing a few pounds.  He feels well overall, although has a bad knee, for which he takes meloxicam daily, and has for several years.      Blood Pressure Goal:  130/80  Current Medications:  Amlodipine 5 mg qd - 5-6 am  Lisinopril 20 mg qd - 5-6 am  Family Hx:   Mother had CABG x 3 in her late 57's, died at 54 from leukemia  Father had no heart issues  Brother with hypertension  Sister healthy  2 children with no issues  Social Hx:  Drinks one cup of coffee per day, drinks couple of beers per day, quit smoking 20 years;  Diet:  Mostly eats at home - has learned to cut back on salt; no dietary restrictions, no deep fried foods; enjoys mixed vegetables, occasional toast, pasta or potatoes; oatmeal or grits for breakfast.  Exercise:  Recently joined gym - elliptical, hand weights, treadmill - going twice weekly, limited some by "bum" knee  Home BP readings:  Brought home cuff in - read within 10 points of office - home average BP with 28 readings twice daily.  AM average 141/85, PM average 124/75.  Intolerances:   nkda  Labs:  03/28/2018:  Na 142, K 4.0, Glu 90, BUN 17, SCr 1.08, GFR 81   Wt Readings from Last 3 Encounters:  03/30/18 187 lb 6.4 oz (85 kg)  10/14/17 176 lb  6.4 oz (80 kg)  10/09/17 174 lb 3.2 oz (79 kg)   BP Readings from Last 3 Encounters:  04/27/18 (!) 152/80  03/30/18 (!) 170/96  10/14/17 138/88   Pulse Readings from Last 3 Encounters:  04/27/18 62  03/30/18 62  10/14/17 (!) 58    Current Outpatient Medications  Medication Sig Dispense Refill  . Adalimumab (HUMIRA) 40 MG/0.4ML PSKT Inject 40 mg into the skin every 14 (fourteen) days.    Marland Kitchen amLODipine (NORVASC) 5 MG tablet Take 1 tablet (5 mg total) by mouth daily. 180 tablet 3  . aspirin 81 MG tablet Take 81 mg by mouth daily.    . fluticasone (FLONASE) 50 MCG/ACT nasal spray Place into both nostrils daily.    Marland Kitchen lisinopril (PRINIVIL,ZESTRIL) 20 MG tablet Take 1 tablet (20 mg total) by mouth daily. 90 tablet 1  . meloxicam (MOBIC) 15 MG tablet Take 1 tablet (15 mg total) by mouth daily. 90 tablet 1  . mesalamine (APRISO) 0.375 g 24 hr capsule Take 4 capsules (1.5 g total) by mouth every morning. 1538m=4 capsules 360 capsule 1  . nitroGLYCERIN (NITROSTAT) 0.4 MG SL tablet Place 1 tablet (0.4 mg total) under the tongue every 5 (five) minutes x 3 doses as needed for chest pain. 25  tablet 4  . rosuvastatin (CRESTOR) 40 MG tablet Take 1 tablet (40 mg total) by mouth daily. 90 tablet 1   No current facility-administered medications for this visit.     No Known Allergies  Past Medical History:  Diagnosis Date  . Arthritis   . CAD (coronary artery disease), non obstructive by cath 07/19/11, treat medically 07/19/2011  . Crohn disease ([place])   . Hypercholesterolemia   . Hyperlipidemia   . Myocardial infarction ([place])   . Skin cancer 2006   Left shoulder; followed every six months.      Blood pressure (!) 152/80, pulse 62.  Essential hypertension Patient with essential hypertension and a definite pattern of morning highs and evening WNL.  He currently takes both the amlodpine and lisinopril in the early morning.  Will have him switch the amlodipine to nights and continue both  medications at their current strengths.  We will see him back in clinic in 4 weeks to determine if his pressure is improved.     Tommy Medal PharmD CPP [facility] [address] [place], [place] [address] [phone]

## 2018-04-27 NOTE — Patient Instructions (Signed)
Return for a a follow up appointment in 4 weeks  Your blood pressure today is 152/80  (goal is < 130/80)  Check your blood pressure at home twice daily and keep record of the readings.  Take your BP meds as follows:  Switch amlodipine dose to evenings starting tonight  Continue with all other medications  Bring all of your meds, your BP cuff and your record of home blood pressures to your next appointment.  Exercise as you're able, try to walk approximately 30 minutes per day.  Keep salt intake to a minimum, especially watch canned and prepared boxed foods.  Eat more fresh fruits and vegetables and fewer canned items.  Avoid eating in fast food restaurants.    HOW TO TAKE YOUR BLOOD PRESSURE: . Rest 5 minutes before taking your blood pressure. .  Don't smoke or drink caffeinated beverages for at least 30 minutes before. . Take your blood pressure before (not after) you eat. . Sit comfortably with your back supported and both feet on the floor (don't cross your legs). . Elevate your arm to heart level on a table or a desk. . Use the proper sized cuff. It should fit smoothly and snugly around your bare upper arm. There should be enough room to slip a fingertip under the cuff. The bottom edge of the cuff should be 1 inch above the crease of the elbow. . Ideally, take 3 measurements at one sitting and record the average.

## 2018-04-30 NOTE — Assessment & Plan Note (Signed)
Patient with essential hypertension and a definite pattern of morning highs and evening WNL.  He currently takes both the amlodpine and lisinopril in the early morning.  Will have him switch the amlodipine to nights and continue both medications at their current strengths.  We will see him back in clinic in 4 weeks to determine if his pressure is improved.

## 2018-05-22 ENCOUNTER — Telehealth: Payer: Self-pay | Admitting: Pharmacist Clinician (PhC)/ Clinical Pharmacy Specialist

## 2018-05-22 ENCOUNTER — Telehealth: Payer: Self-pay | Admitting: Cardiovascular Disease

## 2018-05-22 NOTE — Telephone Encounter (Signed)
Spoke with patient about cancelling appointment due to COVID 19.  Patient notes home blood pressure readings have been doing much better, with the highest systolic at 562.  Asked patient to average home readings (morning and evening separately) and call the results to my VM.  We can review at that time and determine if he needs further evaluation.

## 2018-05-22 NOTE — Telephone Encounter (Signed)
Pt is on [facility] schedule BP follow-up will need pharmacist to return call, as I am not aware of reason for call.

## 2018-05-22 NOTE — Telephone Encounter (Signed)
Patient returned call about his appt this coming Friday 4/3, he said he got a VM but he couldn't understand it. He would like a call back.

## 2018-05-22 NOTE — Telephone Encounter (Signed)
See other encounter.

## 2018-05-23 NOTE — Telephone Encounter (Signed)
Patient returned call.  His home reading for the past several weeks averaged 132/79 in the mornings and 125/74 in the evenings.  Patient feeling well.  Advised to continue with home BP checks 2-3 times per week and monitor for trends going up to 818 systolic.  Patient voiced understanding.

## 2018-05-25 ENCOUNTER — Ambulatory Visit: Payer: BLUE CROSS/BLUE SHIELD

## 2018-06-15 ENCOUNTER — Other Ambulatory Visit: Payer: Self-pay

## 2018-06-15 DIAGNOSIS — Z1329 Encounter for screening for other suspected endocrine disorder: Secondary | ICD-10-CM

## 2018-06-15 DIAGNOSIS — I10 Essential (primary) hypertension: Secondary | ICD-10-CM

## 2018-06-15 DIAGNOSIS — K279 Peptic ulcer, site unspecified, unspecified as acute or chronic, without hemorrhage or perforation: Secondary | ICD-10-CM

## 2018-06-15 DIAGNOSIS — Z125 Encounter for screening for malignant neoplasm of prostate: Secondary | ICD-10-CM

## 2018-06-18 ENCOUNTER — Other Ambulatory Visit: Payer: Self-pay

## 2018-06-18 ENCOUNTER — Telehealth (INDEPENDENT_AMBULATORY_CARE_PROVIDER_SITE_OTHER): Payer: BLUE CROSS/BLUE SHIELD | Admitting: Family Medicine

## 2018-06-18 VITALS — BP 149/82 | Ht 67.5 in | Wt 179.2 lb

## 2018-06-18 DIAGNOSIS — Z1329 Encounter for screening for other suspected endocrine disorder: Secondary | ICD-10-CM | POA: Diagnosis not present

## 2018-06-18 DIAGNOSIS — Z0001 Encounter for general adult medical examination with abnormal findings: Secondary | ICD-10-CM

## 2018-06-18 DIAGNOSIS — R7989 Other specified abnormal findings of blood chemistry: Secondary | ICD-10-CM

## 2018-06-18 DIAGNOSIS — Z0000 Encounter for general adult medical examination without abnormal findings: Secondary | ICD-10-CM

## 2018-06-18 DIAGNOSIS — I10 Essential (primary) hypertension: Secondary | ICD-10-CM | POA: Diagnosis not present

## 2018-06-18 DIAGNOSIS — D649 Anemia, unspecified: Secondary | ICD-10-CM

## 2018-06-18 NOTE — Progress Notes (Signed)
CC: Annual Exam.  Pt wants medication refills for one year on all meds except amlodipine another MD prescribed and he has enough refills, pt also needs 90 day supply on crestor due to insurance.  No other concerns.  No travel outside the [place] or [place] in the past 3 weeks.

## 2018-06-18 NOTE — Progress Notes (Signed)
Telemedicine Encounter- SOAP NOTE Established Patient  This telephone encounter was conducted with the patient's (or proxy's) verbal consent via audio telecommunications: yes/no: Yes Patient was instructed to have this encounter in a suitably private space; and to only have persons present to whom they give permission to participate. In addition, patient identity was confirmed by use of name plus two identifiers (DOB and address).  I discussed the limitations, risks, security and privacy concerns of performing an evaluation and management service by telephone and the availability of in person appointments. I also discussed with the patient that there may be a patient responsible charge related to this service. The patient expressed understanding and agreed to proceed.  I spent a total of TIME; 0 MIN TO 60 MIN: 20 minutes talking with the patient or their proxy.   CC: physical exam   Subjective:  Frederick Rush is a 68 y.o. male here for a health maintenance visit.  Patient is established pt  Hypertension: Patient here for follow-up of elevated blood pressure. He is exercising and is adherent to low salt diet.  Blood pressure is improved at home. Cardiac symptoms none. Patient denies chest pain, chest pressure/discomfort, claudication, exertional chest pressure/discomfort and irregular heart beat.  Cardiovascular risk factors: advanced age (older than 72 for men, 20 for women), dyslipidemia, hypertension and male gender. Use of agents associated with hypertension: none.  BP Readings from Last 3 Encounters:  06/18/18 (!) 149/82  04/27/18 (!) 152/80  03/30/18 (!) 170/96   He takes his bp medication twice a day  Lisinopril in the morning and amlodipine in the evening   Patient Active Problem List   Diagnosis Date Noted  . Osteoarthritis of both hands 10/07/2015  . Essential hypertension 04/24/2014  . CAD (coronary artery disease), non obstructive by cath 07/19/11, treat medically  07/19/2011  . Dyslipidemia 07/16/2011  . History of smoking 07/16/2011  . Family history of coronary artery bypass surgery 07/16/2011  . Crohn disease ([place]) 07/16/2011  . Peptic ulcer disease, endo 2011 07/16/2011    Past Medical History:  Diagnosis Date  . Arthritis   . CAD (coronary artery disease), non obstructive by cath 07/19/11, treat medically 07/19/2011  . Crohn disease ([place])   . Hypercholesterolemia   . Hyperlipidemia   . Myocardial infarction ([place])   . Skin cancer 2006   Left shoulder; followed every six months.      Past Surgical History:  Procedure Laterality Date  . APPENDECTOMY    . CARDIAC CATHETERIZATION  07/15/2011   Medical management  . HERNIA REPAIR    . KNEE SURGERY     arthroscopy  . LEFT HEART CATHETERIZATION WITH CORONARY ANGIOGRAM  07/19/2011   Procedure: LEFT HEART CATHETERIZATION WITH CORONARY ANGIOGRAM;  Surgeon: Leonie Man, MD;  Location: [facility] CATH LAB;  Service: Cardiovascular;;  . TONSILLECTOMY       Outpatient Medications Prior to Visit  Medication Sig Dispense Refill  . Adalimumab (HUMIRA) 40 MG/0.4ML PSKT Inject 40 mg into the skin every 14 (fourteen) days.    Marland Kitchen amLODipine (NORVASC) 5 MG tablet Take 1 tablet (5 mg total) by mouth daily. 180 tablet 3  . aspirin 81 MG tablet Take 81 mg by mouth daily.    . fluticasone (FLONASE) 50 MCG/ACT nasal spray Place into both nostrils daily.    Marland Kitchen lisinopril (PRINIVIL,ZESTRIL) 20 MG tablet Take 1 tablet (20 mg total) by mouth daily. 90 tablet 1  . meloxicam (MOBIC) 15 MG tablet Take 1 tablet (15  mg total) by mouth daily. 90 tablet 1  . mesalamine (APRISO) 0.375 g 24 hr capsule Take 4 capsules (1.5 g total) by mouth every morning. 1529m=4 capsules 360 capsule 1  . nitroGLYCERIN (NITROSTAT) 0.4 MG SL tablet Place 1 tablet (0.4 mg total) under the tongue every 5 (five) minutes x 3 doses as needed for chest pain. 25 tablet 4  . rosuvastatin (CRESTOR) 40 MG tablet Take 1 tablet (40 mg total) by mouth daily.  90 tablet 1   No facility-administered medications prior to visit.     No Known Allergies   Family History  Problem Relation Age of Onset  . Cancer Mother        Breast cancer; leukemia  . Heart disease Mother 75      CABG     Health Habits: Dental Exam: up to date Eye Exam: up to date Exercise: 4-5 times/week on average Current exercise activities: walking/running Diet: balanced  Social History   Socioeconomic History  . Marital status: Married    Spouse name: [name] . Number of children: 2  . Years of education: Associates  . Highest education level: Not on file  Occupational History  . Occupation: BULK DRIVER    Employer: [employer]  . Financial resource strain: Not on file  . Food insecurity:    Worry: Not on file    Inability: Not on file  . Transportation needs:    Medical: Not on file    Non-medical: Not on file  Tobacco Use  . Smoking status: Former Smoker    Last attempt to quit: 07/16/1998    Years since quitting: 19.9  . Smokeless tobacco: Never Used  Substance and Sexual Activity  . Alcohol use: Yes    Alcohol/week: 8.0 standard drinks    Types: 8 Cans of beer per week  . Drug use: No  . Sexual activity: Yes    Partners: Female    Birth control/protection: None, Post-menopausal  Lifestyle  . Physical activity:    Days per week: Not on file    Minutes per session: Not on file  . Stress: Not on file  Relationships  . Social connections:    Talks on phone: Not on file    Gets together: Not on file    Attends religious service: Not on file    Active member of club or organization: Not on file    Attends meetings of clubs or organizations: Not on file    Relationship status: Not on file  . Intimate partner violence:    Fear of current or ex partner: Not on file    Emotionally abused: Not on file    Physically abused: Not on file    Forced sexual activity: Not on file  Other Topics Concern  . 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      Lives: Lives with his wife and their pets.      Tobacco:  None; quit in 2000; smoked x 15 years + 20 years = 35 years.      Alcohol:    2-4 beers per day      Exercise: job physically demanding; mows yard.      Seatbelt: 100%; no  texting   Social History   Substance and Sexual Activity  Alcohol Use Yes  . Alcohol/week: 8.0 standard drinks  . Types: 8 Cans of beer per week   Social History   Tobacco Use  Smoking Status Former Smoker  . Last attempt to quit: 07/16/1998  . Years since quitting: 19.9  Smokeless Tobacco Never Used   Social History   Substance and Sexual Activity  Drug Use No    Health Maintenance: See under health Maintenance activity for review of completion dates as well. Immunization History  Administered Date(s) Administered  . Influenza-Unspecified 12/12/2013, 12/22/2015, 11/21/2016  . Pneumococcal Conjugate-13 10/04/2014  . Pneumococcal Polysaccharide-23 05/24/2016  . Tdap 02/22/2012      Depression Screen-PHQ2/9 Depression screen [facility] 2/9 06/18/2018 06/14/2017 05/01/2017 05/24/2016 01/15/2016  Decreased Interest 0 0 0 0 0  Down, Depressed, Hopeless 0 0 0 0 0  PHQ - 2 Score 0 0 0 0 0       Depression Severity and Treatment Recommendations:  0-4= None  5-9= Mild / Treatment: Support, educate to call if worse; return in one month  10-14= Moderate / Treatment: Support, watchful waiting; Antidepressant or Psycotherapy  15-19= Moderately severe / Treatment: Antidepressant OR Psychotherapy  >= 20 = Major depression, severe / Antidepressant AND Psychotherapy    Review of Systems   ROS  See HPI for ROS as well.   Review of Systems  Constitutional: Negative for activity change, appetite change, chills and fever.  HENT: Negative for congestion,  nosebleeds, trouble swallowing and voice change.   Respiratory: Negative for cough, shortness of breath and wheezing.   Gastrointestinal: Negative for diarrhea, nausea and vomiting.  Genitourinary: Negative for difficulty urinating, dysuria, flank pain and hematuria.  Musculoskeletal: Negative for back pain, joint swelling and neck pain.  Neurological: Negative for dizziness, speech difficulty, light-headedness and numbness.  See HPI. All other review of systems negative.    Objective:   Vitals:   06/18/18 0857  BP: (!) 149/82  Weight: 179 lb 3.2 oz (81.3 kg)  Height: 5' 7.5" (1.715 m)    Body mass index is 27.65 kg/m.  Physical Exam  Normal pulm effort Alert and oriented     Assessment/Plan:   Patient was seen for a health maintenance exam.  Counseled the patient on health maintenance issues. Reviewed her health mainteance schedule and ordered appropriate tests (see orders.) Counseled on regular exercise and weight management. Recommend regular eye exams and dental cleaning.   The following issues were addressed today for health maintenance:   Diagnoses and all orders for this visit:  Encounter for health maintenance examination in adult-  Discussed age appropriate screenings  Screening for thyroid disorder  Essential hypertension- Patient's blood pressure is at goal of 139/89 or less. Condition is stable. Continue current medications and treatment plan. I recommend that you exercise for 30-45 minutes 5 days a week. I also recommend a balanced diet with fruits and vegetables every day, lean meats, and little fried foods. The DASH diet (you can find this online) is a good example of this.  -     Lipid panel; Future -     CMP14+EGFR; Future  Abnormal TSH -     TSH; Future  Mild anemia -     CBC with Differential/Platelet; Future    No follow-ups on file.    Body mass index is 27.65 kg/m.:  Discussed the patient's BMI with patient. The BMI body mass index is  27.65 kg/m.  Future Appointments  Date Time Provider [place]  06/19/2018  9:40 AM PCP-NURSE PCP-PCP [facility]    Patient Instructions       If you have lab work done today you will be contacted with your lab results within the next 2 weeks.  If you have not heard from [place] then please contact us. The fastest way to get your results is to register for My Chart.   IF you received an x-ray today, you will receive an invoice from [facility]. Please contact [facility] at [phone] with questions or concerns regarding your invoice.   IF you received labwork today, you will receive an invoice from [facility]. Please contact [facility] at [phone] with questions or concerns regarding your invoice.   Our billing staff will not be able to assist you with questions regarding bills from these companies.  You will be contacted with the lab results as soon as they are available. The fastest way to get your results is to activate your My Chart account. Instructions are located on the last page of this paperwork. If you have not heard from [place] regarding the results in 2 weeks, please contact this office.                 I discussed the assessment and treatment plan with the patient. The patient was provided an opportunity to ask questions and all were answered. The patient agreed with the plan and demonstrated an understanding of the instructions.   The patient was advised to call back or seek an in-person evaluation if the symptoms worsen or if the condition fails to improve as anticipated.  I provided 20 minutes of non-face-to-face time during this encounter.  Forrest Moron, MD  Primary Care at [facility]

## 2018-06-18 NOTE — Patient Instructions (Signed)
° ° ° °  If you have lab work done today you will be contacted with your lab results within the next 2 weeks.  If you have not heard from us then please contact us. The fastest way to get your results is to register for My Chart. ° ° °IF you received an x-ray today, you will receive an invoice from [facility]. Please contact [facility] at [phone] with questions or concerns regarding your invoice.  ° °IF you received labwork today, you will receive an invoice from [facility]. Please contact [facility] at [phone] with questions or concerns regarding your invoice.  ° °Our billing staff will not be able to assist you with questions regarding bills from these companies. ° °You will be contacted with the lab results as soon as they are available. The fastest way to get your results is to activate your My Chart account. Instructions are located on the last page of this paperwork. If you have not heard from us regarding the results in 2 weeks, please contact this office. °  ° ° ° °

## 2018-06-19 ENCOUNTER — Other Ambulatory Visit: Payer: Self-pay

## 2018-06-19 ENCOUNTER — Ambulatory Visit: Payer: BLUE CROSS/BLUE SHIELD

## 2018-06-19 DIAGNOSIS — I10 Essential (primary) hypertension: Secondary | ICD-10-CM

## 2018-06-19 DIAGNOSIS — D649 Anemia, unspecified: Secondary | ICD-10-CM

## 2018-06-19 DIAGNOSIS — R7989 Other specified abnormal findings of blood chemistry: Secondary | ICD-10-CM

## 2018-06-20 LAB — CBC WITH DIFFERENTIAL/PLATELET
Basophils Absolute: 0.1 10*3/uL (ref 0.0–0.2)
Basos: 1 %
EOS (ABSOLUTE): 0.7 10*3/uL — ABNORMAL HIGH (ref 0.0–0.4)
Eos: 7 %
Hematocrit: 37.6 % (ref 37.5–51.0)
Hemoglobin: 13.3 g/dL (ref 13.0–17.7)
Immature Grans (Abs): 0.1 10*3/uL (ref 0.0–0.1)
Immature Granulocytes: 1 %
Lymphocytes Absolute: 3 10*3/uL (ref 0.7–3.1)
Lymphs: 29 %
MCH: 32.4 pg (ref 26.6–33.0)
MCHC: 35.4 g/dL (ref 31.5–35.7)
MCV: 92 fL (ref 79–97)
Monocytes Absolute: 1.3 10*3/uL — ABNORMAL HIGH (ref 0.1–0.9)
Monocytes: 13 %
Neutrophils Absolute: 5.1 10*3/uL (ref 1.4–7.0)
Neutrophils: 49 %
Platelets: 247 10*3/uL (ref 150–450)
RBC: 4.11 x10E6/uL — ABNORMAL LOW (ref 4.14–5.80)
RDW: 12.6 % (ref 11.6–15.4)
WBC: 10.3 10*3/uL (ref 3.4–10.8)

## 2018-06-20 LAB — CMP14+EGFR
ALT: 20 IU/L (ref 0–44)
AST: 21 IU/L (ref 0–40)
Albumin/Globulin Ratio: 2 (ref 1.2–2.2)
Albumin: 4.5 g/dL (ref 3.8–4.8)
Alkaline Phosphatase: 33 IU/L — ABNORMAL LOW (ref 39–117)
BUN/Creatinine Ratio: 15 (ref 10–24)
BUN: 15 mg/dL (ref 8–27)
Bilirubin Total: 0.4 mg/dL (ref 0.0–1.2)
CO2: 23 mmol/L (ref 20–29)
Calcium: 9.5 mg/dL (ref 8.6–10.2)
Chloride: 99 mmol/L (ref 96–106)
Creatinine, Ser: 0.99 mg/dL (ref 0.76–1.27)
GFR calc Af Amer: 89 mL/min/{1.73_m2} (ref >59)
GFR calc non Af Amer: 77 mL/min/{1.73_m2} (ref >59)
Globulin, Total: 2.2 g/dL (ref 1.5–4.5)
Glucose: 99 mg/dL (ref 65–99)
Potassium: 4.5 mmol/L (ref 3.5–5.2)
Sodium: 138 mmol/L (ref 134–144)
Total Protein: 6.7 g/dL (ref 6.0–8.5)

## 2018-06-20 LAB — LIPID PANEL
Chol/HDL Ratio: 2.2 ratio (ref 0.0–5.0)
Cholesterol, Total: 134 mg/dL (ref 100–199)
HDL: 60 mg/dL (ref >39)
LDL Calculated: 56 mg/dL (ref 0–99)
Triglycerides: 89 mg/dL (ref 0–149)
VLDL Cholesterol Cal: 18 mg/dL (ref 5–40)

## 2018-06-20 LAB — TSH: TSH: 4.89 u[IU]/mL — ABNORMAL HIGH (ref 0.450–4.500)

## 2018-06-21 ENCOUNTER — Other Ambulatory Visit: Payer: Self-pay | Admitting: Family Medicine

## 2018-06-21 DIAGNOSIS — M19041 Primary osteoarthritis, right hand: Secondary | ICD-10-CM

## 2018-06-21 MED ORDER — ROSUVASTATIN CALCIUM 40 MG PO TABS: 40.0000 mg | ORAL_TABLET | Freq: Every day | ORAL | 1 refills | Status: DC

## 2018-06-21 NOTE — Telephone Encounter (Signed)
Sent in crestor.

## 2018-06-21 NOTE — Telephone Encounter (Signed)
Copied from [place] [id number]. Topic: General - Inquiry >> Jun 21, 2018  2:19 PM Frederick Rush, [place] wrote: Reason for CRM: Patient is calling in stating he would like his medication refills sent into the [facility] in [place] as discussed with Dr.[name] in last video visit. Patient is wondering where they are at in the refilling process as he does not want to be penalized for not having 90 days with his current insurance. States he is down to his last 3 of crestor and would like it soon if possible. Please advise. Call back is [phone].

## 2018-07-17 ENCOUNTER — Telehealth: Payer: Self-pay | Admitting: Family Medicine

## 2018-07-17 MED ORDER — AMLODIPINE BESYLATE 5 MG PO TABS: 5.0000 mg | ORAL_TABLET | Freq: Every day | ORAL | 0 refills | Status: DC

## 2018-07-17 MED ORDER — LISINOPRIL 20 MG PO TABS: 20.0000 mg | ORAL_TABLET | Freq: Every day | ORAL | 1 refills | Status: DC

## 2018-07-17 MED ORDER — MELOXICAM 15 MG PO TABS: 15.0000 mg | ORAL_TABLET | Freq: Every day | ORAL | 1 refills | Status: DC

## 2018-07-17 NOTE — Telephone Encounter (Signed)
Left voicemail advising lisinopril, amlodipine and meloxicam  #90 with 1 refill each sent to [facility] in [place] and if pt has any further questions or concerns to call office. Dgaddy, CMA

## 2018-07-17 NOTE — Telephone Encounter (Unsigned)
Copied from [place] [id number]. Topic: Quick Communication - Rx Refill/Question >> Jul 17, 2018  1:38 PM Jeri Cos wrote: Medication: rosuvastatin (CRESTOR) 40 MG tablet  Pt had a visit today and was told that he had enough of this medication to last. Pt called stating that he only had 5  pills remaining which is not enough to last 90 days. Pt is asking for prescription to be sent in to be filled.   Preferred Pharmacy (with phone number or street name): [id number] - [place], [place] [phone] (Phone) [phone] (Fax)    Agent: Please be advised that RX refills may take up to 3 business days. We ask that you follow-up with your pharmacy.

## 2018-07-17 NOTE — Telephone Encounter (Signed)
Copied from [place] [id number]. Topic: General - Inquiry >> Jul 17, 2018  1:32 PM Selinda Flavin B, [place] wrote: Reason for CRM: Patient calling and states that his medications were sent as a 90 day supply, but with only 1 refill. States that his previous provider sent them in with refills enough to last a year. States that he only comes to the doctor once a year for a physical. Advised that the quantity of refills is always up to providers discretion. Patient understands, but would like to request that this happen for future medications/refills. Also, states that he needs all medications to be sent in as 90 day supply due to insurance purposes.

## 2018-07-17 NOTE — Telephone Encounter (Signed)
Pt called stating he still hasn't received his refills on the following meds: Lisinopril, Melicam, Crestor and Amlodipine. All these meds need to be a 90 day supply per insurance. He would like a call back asap when they are sent to the pharmacy. He has changed pharmacies to [facility] in [place]

## 2018-07-19 NOTE — Telephone Encounter (Signed)
Spoke with pt advised crestor 40 mg, meloxicam 15 mg, lisinopril 20 mg and amlodipine 5 mg all filled 90 day supply with 1 refill.  Pt advised the pharmacy called to inform rx's ready and he will p/u today.   Dgaddy, CMA

## 2018-07-19 NOTE — Telephone Encounter (Signed)
Spoke with pt re: refills and verbalizes understanding and will note he'll need 90 day supply with 2 refills. Dgaddy, CMA

## 2018-08-11 ENCOUNTER — Other Ambulatory Visit: Payer: Self-pay | Admitting: Family Medicine

## 2018-08-11 NOTE — Telephone Encounter (Signed)
Forwarding medication refill to PCP for review. 

## 2018-10-14 ENCOUNTER — Other Ambulatory Visit: Payer: Self-pay | Admitting: Family Medicine

## 2018-10-14 NOTE — Telephone Encounter (Signed)
Requested Prescriptions  Pending Prescriptions Disp Refills  . amLODipine (NORVASC) 5 MG tablet [Pharmacy Med Name: AMLODIPINE BESYLATE 5MG TABLETS] 90 tablet 0    Sig: TAKE 1 TABLET(5 MG) BY MOUTH DAILY     Cardiovascular:  Calcium Channel Blockers Failed - 10/14/2018  8:27 AM      Failed - Last BP in normal range    BP Readings from Last 1 Encounters:  06/18/18 (!) 149/82         Failed - Valid encounter within last 6 months    Recent Outpatient Visits          1 year ago Left foot pain   Primary Care at [facility], Arlie Solomons, MD   1 year ago Left foot pain   Primary Care at [facility], Arlie Solomons, MD   1 year ago Routine physical examination   Primary Care at [facility], Renette Butters, MD   1 year ago Acute recurrent maxillary sinusitis   Primary Care at [facility], Renette Butters, MD   2 years ago Routine physical examination   Primary Care at [facility], Renette Butters, MD

## 2018-11-10 ENCOUNTER — Other Ambulatory Visit: Payer: Self-pay | Admitting: Family Medicine

## 2018-11-22 ENCOUNTER — Telehealth: Payer: Self-pay

## 2018-11-22 NOTE — Telephone Encounter (Signed)
   [facility] Pre-operative Risk Assessment    Request for surgical clearance:  1. What type of surgery is being performed? Left total knee atthroplasty   2. When is this surgery scheduled? 01/01/19   3. What type of clearance is required (medical clearance vs. Pharmacy clearance to hold med vs. Both)? Medical  4. Are there any medications that need to be held prior to surgery and how long?Not specified   5. Practice name and name of physician performing surgery? [facility]   6. What is your office phone number [phone]    7.   What is your office fax number [phone] [place]: [place]   8.   Anesthesia type (None, local, MAC, general) ? Choice   Meryl Crutch 11/22/2018, 4:54 PM  _________________________________________________________________   (provider comments below)

## 2018-11-22 NOTE — Telephone Encounter (Signed)
   Primary Cardiologist: Quay Burow, MD  Chart reviewed as part of pre-operative protocol coverage. Patient was contacted 11/22/2018 in reference to pre-operative risk assessment for pending surgery as outlined below.  Frederick Rush was last seen on 03/30/18 by Dr. Gwenlyn Found  Since that day, Frederick Rush has done well.  He can complete more than 4.0 METS without anginal complaints. He had a non-critical heart cath in 2013. If necessary, he may hold ASA for 5-7 days prior to surgery.  Therefore, based on ACC/AHA guidelines, the patient would be at acceptable risk for the planned procedure without further cardiovascular testing.   I will route this recommendation to the requesting party via Epic fax function and remove from pre-op pool.  Please call with questions.  [place], PA 11/22/2018, 5:11 PM

## 2018-12-12 ENCOUNTER — Encounter: Payer: Self-pay | Admitting: Family Medicine

## 2018-12-12 ENCOUNTER — Ambulatory Visit (INDEPENDENT_AMBULATORY_CARE_PROVIDER_SITE_OTHER): Payer: BC Managed Care – PPO | Admitting: Family Medicine

## 2018-12-12 ENCOUNTER — Other Ambulatory Visit: Payer: Self-pay

## 2018-12-12 VITALS — BP 130/72 | HR 68 | Temp 98.0°F | Ht 67.5 in | Wt 186.2 lb

## 2018-12-12 DIAGNOSIS — I2510 Atherosclerotic heart disease of native coronary artery without angina pectoris: Secondary | ICD-10-CM | POA: Diagnosis not present

## 2018-12-12 DIAGNOSIS — I10 Essential (primary) hypertension: Secondary | ICD-10-CM | POA: Diagnosis not present

## 2018-12-12 DIAGNOSIS — Z01818 Encounter for other preprocedural examination: Secondary | ICD-10-CM

## 2018-12-12 LAB — POCT URINALYSIS DIP (MANUAL ENTRY)
Bilirubin, UA: NEGATIVE
Blood, UA: NEGATIVE
Glucose, UA: NEGATIVE mg/dL
Ketones, POC UA: NEGATIVE mg/dL
Leukocytes, UA: NEGATIVE
Nitrite, UA: NEGATIVE
Protein Ur, POC: NEGATIVE mg/dL
Spec Grav, UA: 1.01 (ref 1.010–1.025)
Urobilinogen, UA: 0.2 E.U./dL
pH, UA: 6 (ref 5.0–8.0)

## 2018-12-12 NOTE — Progress Notes (Signed)
Established Patient Office Visit  Subjective:  Patient ID: Frederick Rush, male    DOB: [birth year]  Age: 68 y.o. MRN: [id number]  CC:  Chief Complaint  Patient presents with  . Medical Clearance    HPI Frederick Rush presents for   Pt is here for preop clearance He has a history of CAD, hypercholesterolemia, hyperlipidemia and MI  Surgery is scheduled for 12/31/2018 for left knee replacement He had previous surgery and tolerated anesthesia without complication He states that he is not currently taking aspirin  He reports that he is currently off Humira  He denies any current chest pains or palpitations. He reports that his current Crohn's is very mild. No blood per rectum   Past Medical History:  Diagnosis Date  . Arthritis   . CAD (coronary artery disease), non obstructive by cath 07/19/11, treat medically 07/19/2011  . Crohn disease ([place])   . Hypercholesterolemia   . Hyperlipidemia   . Myocardial infarction ([place])   . Skin cancer 2006   Left shoulder; followed every six months.      Past Surgical History:  Procedure Laterality Date  . APPENDECTOMY    . CARDIAC CATHETERIZATION  07/15/2011   Medical management  . HERNIA REPAIR    . KNEE SURGERY     arthroscopy  . LEFT HEART CATHETERIZATION WITH CORONARY ANGIOGRAM  07/19/2011   Procedure: LEFT HEART CATHETERIZATION WITH CORONARY ANGIOGRAM;  Surgeon: Leonie Man, MD;  Location: [facility] CATH LAB;  Service: Cardiovascular;;  . TONSILLECTOMY      Family History  Problem Relation Age of Onset  . Cancer Mother        Breast cancer; leukemia  . Heart disease Mother 10       CABG    Social History   Socioeconomic History  . Marital status: Married    Spouse name: Shirlean Mylar  . Number of children: 2  . Years of education: Associates  . Highest education level: Not on file  Occupational History  . Occupation: BULK DRIVER    Employer: [employer]  Social Needs  . Financial resource strain: Not on file   . Food insecurity    Worry: Not on file    Inability: Not on file  . Transportation needs    Medical: Not on file    Non-medical: Not on file  Tobacco Use  . Smoking status: Former Smoker    Quit date: 07/16/1998    Years since quitting: 20.4  . Smokeless tobacco: Never Used  Substance and Sexual Activity  . Alcohol use: Yes    Alcohol/week: 8.0 standard drinks    Types: 8 Cans of beer per week  . Drug use: No  . Sexual activity: Yes    Partners: Female    Birth control/protection: None, Post-menopausal  Lifestyle  . Physical activity    Days per week: Not on file    Minutes per session: Not on file  . Stress: Not on file  Relationships  . Social Herbalist on phone: Not on file    Gets together: Not on file    Attends religious service: Not on file    Active member of club or organization: Not on file    Attends meetings of clubs or organizations: Not on file    Relationship status: Not on file  . Intimate partner violence    Fear of current or ex partner: Not on file    Emotionally abused: Not on file  Physically abused: Not on file    Forced sexual activity: Not on file  Other Topics Concern  . 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      Lives: Lives with his wife and their pets.      Tobacco:  None; quit in 2000; smoked x 15 years + 20 years = 35 years.      Alcohol:    2-4 beers per day      Exercise: job physically demanding; mows yard.      Seatbelt: 100%; no texting    Outpatient Medications Prior to Visit  Medication Sig Dispense Refill  . Adalimumab (HUMIRA) 40 MG/0.4ML PSKT Inject 40 mg into the skin every 14 (fourteen) days.    Marland Kitchen amLODipine (NORVASC) 5 MG tablet TAKE 1 TABLET(5 MG) BY MOUTH DAILY 90 tablet 0  . aspirin 81 MG tablet Take 81 mg by mouth daily.    .  fluticasone (FLONASE) 50 MCG/ACT nasal spray Place into both nostrils daily.    Marland Kitchen lisinopril (ZESTRIL) 20 MG tablet Take 1 tablet (20 mg total) by mouth daily. 90 tablet 1  . meloxicam (MOBIC) 15 MG tablet Take 1 tablet (15 mg total) by mouth daily. 90 tablet 1  . mesalamine (APRISO) 0.375 g 24 hr capsule Take 4 capsules (1.5 g total) by mouth every morning. 1525m=4 capsules 360 capsule 1  . nitroGLYCERIN (NITROSTAT) 0.4 MG SL tablet Place 1 tablet (0.4 mg total) under the tongue every 5 (five) minutes x 3 doses as needed for chest pain. 25 tablet 4  . rosuvastatin (CRESTOR) 40 MG tablet TAKE 1 TABLET BY MOUTH DAILY 90 tablet 3   No facility-administered medications prior to visit.     No Known Allergies  ROS Review of Systems Review of Systems  Constitutional: Negative for activity change, appetite change, chills and fever.  HENT: Negative for congestion, nosebleeds, trouble swallowing and voice change.   Respiratory: Negative for cough, shortness of breath and wheezing.   Cardiac: see hpi Gastrointestinal: Negative for diarrhea, nausea and vomiting.  Genitourinary: Negative for difficulty urinating, dysuria, flank pain and hematuria.  Musculoskeletal: Negative for back pain, joint swelling and neck pain.  Neurological: Negative for dizziness, speech difficulty, light-headedness and numbness.  See HPI. All other review of systems negative.     Objective:     BP 130/72   Pulse 68   Temp 98 F (36.7 C)   Ht 5' 7.5" (1.715 m)   Wt 186 lb 3.2 oz (84.5 kg)   SpO2 98%   BMI 28.73 kg/m  Wt Readings from Last 3 Encounters:  12/12/18 186 lb 3.2 oz (84.5 kg)  06/18/18 179 lb 3.2 oz (81.3 kg)  03/30/18 187 lb 6.4 oz (85 kg)   Physical Exam Constitutional:      Appearance: Normal appearance.  HENT:     Head: Normocephalic and atraumatic.     Right Ear: External ear normal.     Left Ear: External ear normal.     Nose: Nose normal.     Mouth/Throat:     Mouth: Mucous membranes  are moist.     Pharynx: No oropharyngeal exudate.  Eyes:     Extraocular Movements: Extraocular movements intact.     Conjunctiva/sclera: Conjunctivae  normal.  Cardiovascular:     Rate and Rhythm: Normal rate and regular rhythm.     Pulses: Normal pulses.     Heart sounds: No murmur.  Pulmonary:     Effort: Pulmonary effort is normal. No respiratory distress.     Breath sounds: Normal breath sounds. No stridor. No wheezing, rhonchi or rales.  Chest:     Chest wall: No tenderness.  Abdominal:     General: Bowel sounds are normal. There is no distension.     Palpations: Abdomen is soft. There is no mass.     Tenderness: There is no abdominal tenderness. There is no guarding or rebound.     Hernia: No hernia is present.  Musculoskeletal:     Comments: Left knee with OA changes, bone on bone, stiffness  Skin:    General: Skin is warm.     Capillary Refill: Capillary refill takes less than 2 seconds.     Findings: No rash.  Neurological:     General: No focal deficit present.     Mental Status: He is alert and oriented to person, place, and time.     Cranial Nerves: No cranial nerve deficit.     Motor: No weakness.  Psychiatric:        Mood and Affect: Mood normal.        Behavior: Behavior normal.        Thought Content: Thought content normal.        Judgment: Judgment normal.      Health Maintenance Due  Topic Date Due  . INFLUENZA VACCINE  09/22/2018    There are no preventive care reminders to display for this patient.  Lab Results  Component Value Date   TSH 4.890 (H) 06/19/2018   Lab Results  Component Value Date   WBC 10.3 06/19/2018   HGB 13.3 06/19/2018   HCT 37.6 06/19/2018   MCV 92 06/19/2018   PLT 247 06/19/2018   Lab Results  Component Value Date   NA 138 06/19/2018   K 4.5 06/19/2018   CO2 23 06/19/2018   GLUCOSE 99 06/19/2018   BUN 15 06/19/2018   CREATININE 0.99 06/19/2018   BILITOT 0.4 06/19/2018   ALKPHOS 33 (L) 06/19/2018   AST 21  06/19/2018   ALT 20 06/19/2018   PROT 6.7 06/19/2018   ALBUMIN 4.5 06/19/2018   CALCIUM 9.5 06/19/2018   Lab Results  Component Value Date   CHOL 134 06/19/2018   Lab Results  Component Value Date   HDL 60 06/19/2018   Lab Results  Component Value Date   LDLCALC 56 06/19/2018   Lab Results  Component Value Date   TRIG 89 06/19/2018   Lab Results  Component Value Date   CHOLHDL 2.2 06/19/2018   Lab Results  Component Value Date   HGBA1C 5.4 05/24/2016   ECG sinus bradycardia, HR 56, sinus rhythm   Assessment & Plan:   Problem List Items Addressed This Visit    None    Visit Diagnoses    Preoperative clearance    -  Primary   Relevant Orders   Hemoglobin A1c   EKG 12-Lead (Completed)   Protime-INR   CMP14+EGFR   CBC   POCT urinalysis dipstick     Discussed preop meds and EKG EKG sinus rhythm with mild bradycardia HR 56 Has a regular Cardiologist and is doing well with his medications  Labs pending  Hypertension stable, CAD stable Reviewed previous ECG and notes from Cardiology  No orders of the defined types were placed in this encounter.   Follow-up: No follow-ups on file.    Forrest Moron, MD

## 2018-12-12 NOTE — Patient Instructions (Addendum)
If you are taking aspirin or Plavix (clopidogrel), stop them 1 week before your surgery.  Don't take any ibuprofen (Advil, Motrin), naproxen (Aleve, Naprosyn), or aspirin within a week of surgery.  If you take medicine for blood pressure, take your blood pressure medicine the morning of surgery with a small sip of water.  Take your other medications normally through the day before surgery.    If you have lab work done today you will be contacted with your lab results within the next 2 weeks.  If you have not heard from [place] then please contact us. The fastest way to get your results is to register for My Chart.   IF you received an x-ray today, you will receive an invoice from [facility]. Please contact [facility] at [phone] with questions or concerns regarding your invoice.   IF you received labwork today, you will receive an invoice from [facility]. Please contact [facility] at [phone] with questions or concerns regarding your invoice.   Our billing staff will not be able to assist you with questions regarding bills from these companies.  You will be contacted with the lab results as soon as they are available. The fastest way to get your results is to activate your My Chart account. Instructions are located on the last page of this paperwork. If you have not heard from [place] regarding the results in 2 weeks, please contact this office.     Safety Before and After Surgery Your health care providers, including your surgical team, take many precautions to keep you safe during surgery. This sheet explains the steps that your health care providers take to prevent surgical error and to reduce the risk of complications. It also lists some things that you, your friends, and your family members can do to ensure a safe surgery. What is surgical error? Surgical error is when a mistake is made during a procedure. Types of surgical error include:  Operating on the wrong  person.  Operating on the wrong body part.  Performing the wrong operation.  Making a medicine mistake.  Making a surgical mistake. What is a complication? A complication is a change in the normal surgical plan of care. Complications are known risks that, while rare, can occur during or after surgery. Examples of complications include:  Infection.  Excess bleeding.  Inhaling food or liquids from your stomach into your lungs (aspiration).  Allergic reaction to medicines. Review all risks and complications with your health care provider as part of the informed consent procedure before your surgery. Ask questions if you do not understand something. What steps are taken to prevent surgical error and complications? Your health care providers, including your surgical team, take many steps before and during a procedure to reduce the risk of error and complication. These steps include:  Placing an identification bracelet on your wrist and checking it often.  Checking the surgical consent form to make sure it is correct.  Reviewing the surgical consent form with you to make sure that you understand the procedure.  Reviewing your medicines and history of allergies to prevent drug reactions and interactions.  Marking the surgical site before the procedure.  Washing and disinfecting the surgical site before the procedure to prevent infection.  Giving you a list of safety steps to take before surgery. This usually includes what foods or medicines to avoid and any lifestyle changes you should make before the procedure, such as quitting smoking. How can I help to promote a safe surgery?  One way you can help to prevent surgical error is by choosing a surgeon and facility that is right for you. To do this:  Ask how many times your surgeon has performed the surgery you will be having.  Ask how often the surgery is done in the hospital where you will have the procedure.  Choose a hospital  that is licensed, accredited, and has a system in place in case of an emergency. Here are some other steps you can take:  Talk to your anesthesia provider about your medicines, allergies, any drug use, and any previous drug or anesthesia reactions you have had. Be honest.  Review the surgical consent form. Make sure you understand it completely.  Mark your surgical site. This will be done just before surgery. Your health care provider will give you a special marker to use. Make sure it is the correct area for surgery.  Do not eat any food or drink before surgery if your surgeon has instructed you not to.  Keep the surgical area clean. Follow instructions if you are asked to shower with an antibacterial soap.  Follow all of your home care instructions. Wash your hands often, and especially before bandage (dressing) changes. How can my friends or family members promote a safe surgery? Friends and family can help by:  Going with you to your medical appointments before your surgery to make sure you understand the procedure.  Reviewing the safety steps that you need to take before surgery and making sure that you follow them.  Knowing what surgery you are scheduled to have.  Double-checking that the correct body part is marked before surgery.  Going with you to the procedure.  Advocating for you on your behalf if you cannot do so.  Following the home care instructions for after the surgery to help you heal well. Questions to ask before surgery 1. Why do I need surgery? Is another treatment available? 2. What are the risks and benefits of the surgery? 3. Do I have a choice of anesthesia? 4. What should I expect after surgery? Can I eat and drink after surgery? How much pain will I have? When can I go back to work? 5. What is your experience with this type of surgery? Is the surgery center accredited? 6. Do you take my insurance? How much will the surgery cost? Where to find more  information  [employer]: http://www.hawkins-.info/  Centers for Disease Control and Prevention: VerifiedMovies.gl Summary  Your surgical team takes specific precautions to prevent surgical errors and complications.  You can take steps to promote a safe surgery.  Ask questions before your procedure, and make sure that you understand what will happen.  Make sure that you understand your instructions for home care. This information is not intended to replace advice given to you by your health care provider. Make sure you discuss any questions you have with your health care provider. Document Released: 03/11/2016 Document Revised: 04/05/2018 Document Reviewed: 03/11/2016 Elsevier Patient Education  2020 [employer].

## 2018-12-13 LAB — CMP14+EGFR
ALT: 90 IU/L — ABNORMAL HIGH (ref 0–44)
AST: 45 IU/L — ABNORMAL HIGH (ref 0–40)
Albumin/Globulin Ratio: 2.6 — ABNORMAL HIGH (ref 1.2–2.2)
Albumin: 4.6 g/dL (ref 3.8–4.8)
Alkaline Phosphatase: 36 IU/L — ABNORMAL LOW (ref 39–117)
BUN/Creatinine Ratio: 11 (ref 10–24)
BUN: 10 mg/dL (ref 8–27)
Bilirubin Total: 0.3 mg/dL (ref 0.0–1.2)
CO2: 25 mmol/L (ref 20–29)
Calcium: 9.6 mg/dL (ref 8.6–10.2)
Chloride: 96 mmol/L (ref 96–106)
Creatinine, Ser: 0.92 mg/dL (ref 0.76–1.27)
GFR calc Af Amer: 98 mL/min/{1.73_m2} (ref >59)
GFR calc non Af Amer: 85 mL/min/{1.73_m2} (ref >59)
Globulin, Total: 1.8 g/dL (ref 1.5–4.5)
Glucose: 72 mg/dL (ref 65–99)
Potassium: 4.1 mmol/L (ref 3.5–5.2)
Sodium: 134 mmol/L (ref 134–144)
Total Protein: 6.4 g/dL (ref 6.0–8.5)

## 2018-12-13 LAB — CBC
Hematocrit: 38.5 % (ref 37.5–51.0)
Hemoglobin: 13.2 g/dL (ref 13.0–17.7)
MCH: 31.7 pg (ref 26.6–33.0)
MCHC: 34.3 g/dL (ref 31.5–35.7)
MCV: 92 fL (ref 79–97)
Platelets: 357 10*3/uL (ref 150–450)
RBC: 4.17 x10E6/uL (ref 4.14–5.80)
RDW: 13.2 % (ref 11.6–15.4)
WBC: 9.8 10*3/uL (ref 3.4–10.8)

## 2018-12-13 LAB — PROTIME-INR
INR: 0.9 (ref 0.9–1.2)
Prothrombin Time: 10 s (ref 9.1–12.0)

## 2018-12-13 LAB — HEMOGLOBIN A1C
Est. average glucose Bld gHb Est-mCnc: 117 mg/dL
Hgb A1c MFr Bld: 5.7 % — ABNORMAL HIGH (ref 4.8–5.6)

## 2018-12-20 NOTE — Patient Instructions (Addendum)
DUE TO COVID-19 ONLY ONE VISITOR IS ALLOWED TO COME WITH YOU AND STAY IN THE WAITING ROOM ONLY DURING PRE OP AND PROCEDURE DAY OF SURGERY. THE 1 VISITOR MAY VISIT WITH YOU AFTER SURGERY IN YOUR PRIVATE ROOM DURING VISITING HOURS ONLY!  YOU NEED TO HAVE A COVID 19 TEST ON__11/5/20_____ @__1 :00pm_____, THIS TEST MUST BE DONE BEFORE SURGERY, COME  [address], [place] , [address].  (Frederick Rush) ONCE YOUR COVID TEST IS COMPLETED, PLEASE BEGIN THE QUARANTINE INSTRUCTIONS AS OUTLINED IN YOUR HANDOUT.                Frederick Rush    Your procedure is scheduled on: 12/31/18   Report to [facility] Main  Entrance   Report to admitting at  10:10 AM     Call this number if you have problems the morning of surgery [phone]   . BRUSH YOUR TEETH MORNING OF SURGERY AND RINSE YOUR MOUTH OUT, NO CHEWING GUM CANDY OR MINTS.  Do not eat food After Midnight.   YOU MAY HAVE CLEAR LIQUIDS FROM MIDNIGHT UNTIL 9:30AM    CLEAR LIQUID DIET   Foods Allowed                                                                     Foods Excluded  Coffee and tea, regular and decaf                             liquids that you cannot  Plain Jell-O any favor except red or purple                                           see through such as: Fruit ices (not with fruit pulp)                                     milk, soups, orange juice  Iced Popsicles                                    All solid food Carbonated beverages, regular and diet                                    Cranberry, grape and apple juices Sports drinks like Gatorade Lightly seasoned clear broth or consume(fat free) Sugar, honey syrup    At 4:30AM Please finish the prescribed Pre-Surgery  drink.   Nothing by mouth after you finish the  drink !    Take these medicines the morning of surgery with A SIP OF WATER: Apriso                                 You may not have any metal on your body including  piercings  Do not wear jewelry, make-up, lotions, powders or perfumes, deodorant               Men may shave face and neck.   Do not bring valuables to the hospital. [place].  Contacts, dentures or bridgework may not be worn into surgery.       Name and phone number of your driver:  Special Instructions: N/A              Please read over the following fact sheets you were given:              [facility] - Preparing for Surgery  Before surgery, you can play an important role.   Because skin is not sterile, your skin needs to be as free of germs as possible.   You can reduce the number of germs on your skin by washing with CHG (chlorahexidine gluconate) soap before surgery.   CHG is an antiseptic cleaner which kills germs and bonds with the skin to continue killing germs even after washing. Please DO NOT use if you have an allergy to CHG or antibacterial soaps.   If your skin becomes reddened/irritated stop using the CHG and inform your nurse when you arrive at Short Stay.   You may shave your face/neck.  Please follow these instructions carefully:  1.  Shower with CHG Soap the night before surgery and the  morning of Surgery.  2.  If you choose to wash your hair, wash your hair first as usual with your  normal  shampoo.  3.  After you shampoo, rinse your hair and body thoroughly to remove the  shampoo.                                        4.  Use CHG as you would any other liquid soap.  You can apply chg directly  to the skin and wash                       Gently with a scrungie or clean washcloth.  5.  Apply the CHG Soap to your body ONLY FROM THE NECK DOWN.   Do not use on face/ open                           Wound or open sores. Avoid contact with eyes, ears mouth and genitals (private parts).                       Wash face,  Genitals (private parts) with your normal soap.             6.  Wash thoroughly, paying  special attention to the area where your surgery  will be performed.  7.  Thoroughly rinse your body with warm water from the neck down.  8.  DO NOT shower/wash with your normal soap after using and rinsing off  the CHG Soap.             9.  Pat yourself dry with a clean towel.            10.  Wear clean pajamas.  11.  Place clean sheets on your bed the night of your first shower and do not  sleep with pets. Day of Surgery : Do not apply any lotions/deodorants the morning of surgery.  Please wear clean clothes to the hospital/surgery center.  FAILURE TO FOLLOW THESE INSTRUCTIONS MAY RESULT IN THE CANCELLATION OF YOUR SURGERY PATIENT SIGNATURE_________________________________  NURSE SIGNATURE__________________________________  ________________________________________________________________________   Frederick Rush  An incentive spirometer is a tool that can help keep your lungs clear and active. This tool measures how well you are filling your lungs with each breath. Taking long deep breaths may help reverse or decrease the chance of developing breathing (pulmonary) problems (especially infection) following:  A long period of time when you are unable to move or be active. BEFORE THE PROCEDURE   If the spirometer includes an indicator to show your best effort, your nurse or respiratory therapist will set it to a desired goal.  If possible, sit up straight or lean slightly forward. Try not to slouch.  Hold the incentive spirometer in an upright position. INSTRUCTIONS FOR USE  1. Sit on the edge of your bed if possible, or sit up as far as you can in bed or on a chair. 2. Hold the incentive spirometer in an upright position. 3. Breathe out normally. 4. Place the mouthpiece in your mouth and seal your lips tightly around it. 5. Breathe in slowly and as deeply as possible, raising the piston or the ball toward the top of the column. 6. Hold your breath for 3-5 seconds or  for as long as possible. Allow the piston or ball to fall to the bottom of the column. 7. Remove the mouthpiece from your mouth and breathe out normally. 8. Rest for a few seconds and repeat Steps 1 through 7 at least 10 times every 1-2 hours when you are awake. Take your time and take a few normal breaths between deep breaths. 9. The spirometer may include an indicator to show your best effort. Use the indicator as a goal to work toward during each repetition. 10. After each set of 10 deep breaths, practice coughing to be sure your lungs are clear. If you have an incision (the cut made at the time of surgery), support your incision when coughing by placing a pillow or rolled up towels firmly against it. Once you are able to get out of bed, walk around indoors and cough well. You may stop using the incentive spirometer when instructed by your caregiver.  RISKS AND COMPLICATIONS  Take your time so you do not get dizzy or light-headed.  If you are in pain, you may need to take or ask for pain medication before doing incentive spirometry. It is harder to take a deep breath if you are having pain. AFTER USE  Rest and breathe slowly and easily.  It can be helpful to keep track of a log of your progress. Your caregiver can provide you with a simple table to help with this. If you are using the spirometer at home, follow these instructions: [place] IF:   You are having difficultly using the spirometer.  You have trouble using the spirometer as often as instructed.  Your pain medication is not giving enough relief while using the spirometer.  You develop fever of 100.5 F (38.1 C) or higher. SEEK IMMEDIATE MEDICAL CARE IF:   You cough up bloody sputum that had not been present before.  You develop fever of 102 F (38.9 C) or  greater.  You develop worsening pain at or near the incision site. MAKE SURE YOU:   Understand these instructions.  Will watch your condition.  Will  get help right away if you are not doing well or get worse. Document Released: 06/20/2006 Document Revised: 05/02/2011 Document Reviewed: 08/21/2006 ExitCare Patient Information 2014 [employer], [place].   ________________________________________________________________________  WHAT IS A BLOOD TRANSFUSION? Blood Transfusion Information  A transfusion is the replacement of blood or some of its parts. Blood is made up of multiple cells which provide different functions.  Red blood cells carry oxygen and are used for blood loss replacement.  White blood cells fight against infection.  Platelets control bleeding.  Plasma helps clot blood.  Other blood products are available for specialized needs, such as hemophilia or other clotting disorders. BEFORE THE TRANSFUSION  Who gives blood for transfusions?   Healthy volunteers who are fully evaluated to make sure their blood is safe. This is blood bank blood. Transfusion therapy is the safest it has ever been in the practice of medicine. Before blood is taken from a donor, a complete history is taken to make sure that person has no history of diseases nor engages in risky social behavior (examples are intravenous drug use or sexual activity with multiple partners). The donor's travel history is screened to minimize risk of transmitting infections, such as malaria. The donated blood is tested for signs of infectious diseases, such as HIV and hepatitis. The blood is then tested to be sure it is compatible with you in order to minimize the chance of a transfusion reaction. If you or a relative donates blood, this is often done in anticipation of surgery and is not appropriate for emergency situations. It takes many days to process the donated blood. RISKS AND COMPLICATIONS Although transfusion therapy is very safe and saves many lives, the main dangers of transfusion include:   Getting an infectious disease.  Developing a transfusion reaction. This is  an allergic reaction to something in the blood you were given. Every precaution is taken to prevent this. The decision to have a blood transfusion has been considered carefully by your caregiver before blood is given. Blood is not given unless the benefits outweigh the risks. AFTER THE TRANSFUSION  Right after receiving a blood transfusion, you will usually feel much better and more energetic. This is especially true if your red blood cells have gotten low (anemic). The transfusion raises the level of the red blood cells which carry oxygen, and this usually causes an energy increase.  The nurse administering the transfusion will monitor you carefully for complications. HOME CARE INSTRUCTIONS  No special instructions are needed after a transfusion. You may find your energy is better. Speak with your caregiver about any limitations on activity for underlying diseases you may have. SEEK MEDICAL CARE IF:   Your condition is not improving after your transfusion.  You develop redness or irritation at the intravenous (IV) site. SEEK IMMEDIATE MEDICAL CARE IF:  Any of the following symptoms occur over the next 12 hours:  Shaking chills.  You have a temperature by mouth above 102 F (38.9 C), not controlled by medicine.  Chest, back, or muscle pain.  People around you feel you are not acting correctly or are confused.  Shortness of breath or difficulty breathing.  Dizziness and fainting.  You get a rash or develop hives.  You have a decrease in urine output.  Your urine turns a dark color or changes to pink, red, or  brown. Any of the following symptoms occur over the next 10 days:  You have a temperature by mouth above 102 F (38.9 C), not controlled by medicine.  Shortness of breath.  Weakness after normal activity.  The white part of the eye turns yellow (jaundice).  You have a decrease in the amount of urine or are urinating less often.  Your urine turns a dark color or  changes to pink, red, or brown. Document Released: 02/05/2000 Document Revised: 05/02/2011 Document Reviewed: 09/24/2007 [facility] Patient Information 2014 [place], [place].  _______________________________________________________________________

## 2018-12-21 ENCOUNTER — Ambulatory Visit: Payer: Self-pay | Admitting: Family Medicine

## 2018-12-24 ENCOUNTER — Encounter (HOSPITAL_COMMUNITY)
Admission: RE | Admit: 2018-12-24 | Discharge: 2018-12-24 | Disposition: A | Discharge status: Home / Self Care | Payer: BC Managed Care – PPO | Source: Ambulatory Visit | Attending: Orthopedic Surgery | Admitting: Orthopedic Surgery

## 2018-12-24 ENCOUNTER — Other Ambulatory Visit: Payer: Self-pay

## 2018-12-24 ENCOUNTER — Encounter (HOSPITAL_COMMUNITY): Payer: Self-pay

## 2018-12-24 DIAGNOSIS — Z01812 Encounter for preprocedural laboratory examination: Secondary | ICD-10-CM | POA: Diagnosis not present

## 2018-12-24 LAB — PROTIME-INR
INR: 1 (ref 0.8–1.2)
Prothrombin Time: 12.6 seconds (ref 11.4–15.2)

## 2018-12-24 LAB — CBC WITH DIFFERENTIAL/PLATELET
Abs Immature Granulocytes: 0.04 10*3/uL (ref 0.00–0.07)
Basophils Absolute: 0.1 10*3/uL (ref 0.0–0.1)
Basophils Relative: 1 %
Eosinophils Absolute: 0.2 10*3/uL (ref 0.0–0.5)
Eosinophils Relative: 2 %
HCT: 40 % (ref 39.0–52.0)
Hemoglobin: 13.3 g/dL (ref 13.0–17.0)
Immature Granulocytes: 1 %
Lymphocytes Relative: 22 %
Lymphs Abs: 1.9 10*3/uL (ref 0.7–4.0)
MCH: 31.1 pg (ref 26.0–34.0)
MCHC: 33.3 g/dL (ref 30.0–36.0)
MCV: 93.7 fL (ref 80.0–100.0)
Monocytes Absolute: 1.1 10*3/uL — ABNORMAL HIGH (ref 0.1–1.0)
Monocytes Relative: 12 %
Neutro Abs: 5.4 10*3/uL (ref 1.7–7.7)
Neutrophils Relative %: 62 %
Platelets: 297 10*3/uL (ref 150–400)
RBC: 4.27 MIL/uL (ref 4.22–5.81)
RDW: 13.3 % (ref 11.5–15.5)
WBC: 8.7 10*3/uL (ref 4.0–10.5)
nRBC: 0 % (ref 0.0–0.2)

## 2018-12-24 LAB — SURGICAL PCR SCREEN
MRSA, PCR: NEGATIVE
Staphylococcus aureus: NEGATIVE

## 2018-12-24 LAB — COMPREHENSIVE METABOLIC PANEL
ALT: 82 U/L — ABNORMAL HIGH (ref 0–44)
AST: 41 U/L (ref 15–41)
Albumin: 4.5 g/dL (ref 3.5–5.0)
Alkaline Phosphatase: 35 U/L — ABNORMAL LOW (ref 38–126)
Anion gap: 8 (ref 5–15)
BUN: 12 mg/dL (ref 8–23)
CO2: 27 mmol/L (ref 22–32)
Calcium: 9.4 mg/dL (ref 8.9–10.3)
Chloride: 103 mmol/L (ref 98–111)
Creatinine, Ser: 0.9 mg/dL (ref 0.61–1.24)
GFR calc Af Amer: >60 mL/min (ref >60)
GFR calc non Af Amer: >60 mL/min (ref >60)
Glucose, Bld: 95 mg/dL (ref 70–99)
Potassium: 3.8 mmol/L (ref 3.5–5.1)
Sodium: 138 mmol/L (ref 135–145)
Total Bilirubin: 0.8 mg/dL (ref 0.3–1.2)
Total Protein: 7.2 g/dL (ref 6.5–8.1)

## 2018-12-24 LAB — ABO/RH: ABO/RH(D): A POS

## 2018-12-24 LAB — APTT: aPTT: 26 seconds (ref 24–36)

## 2018-12-24 NOTE — Progress Notes (Signed)
PCP - Dr. Burnett Harry Cardiologist - Dr. Adora Fridge  Chest x-ray - 11/14/18 EKG - 12/12/18 Stress Test - no ECHO - no Cardiac Cath - 2013  Sleep Study - no CPAP -   Fasting Blood Sugar - NA Checks Blood Sugar _____ times a day  Blood Thinner Instructions:ASA Aspirin Instructions:Stop 2 weeks prior to surgery. Dr. Anne Fu office Last Dose:12/17/18  Anesthesia review:   Patient denies shortness of breath, fever, cough and chest pain at PAT appointment yes  Patient verbalized understanding of instructions that were given to them at the PAT appointment. Patient was also instructed that they will need to review over the PAT instructions again at home before surgery. yes

## 2018-12-27 ENCOUNTER — Other Ambulatory Visit (HOSPITAL_COMMUNITY)
Admission: RE | Admit: 2018-12-27 | Discharge: 2018-12-27 | Disposition: A | Discharge status: Home / Self Care | Payer: Medicare Other | Source: Ambulatory Visit | Attending: Orthopedic Surgery | Admitting: Orthopedic Surgery

## 2018-12-27 DIAGNOSIS — Z20828 Contact with and (suspected) exposure to other viral communicable diseases: Secondary | ICD-10-CM | POA: Diagnosis not present

## 2018-12-27 DIAGNOSIS — Z01812 Encounter for preprocedural laboratory examination: Secondary | ICD-10-CM | POA: Insufficient documentation

## 2018-12-28 LAB — NOVEL CORONAVIRUS, NAA (HOSP ORDER, SEND-OUT TO REF LAB; TAT 18-24 HRS): SARS-CoV-2, NAA: NOT DETECTED

## 2018-12-28 NOTE — H&P (Signed)
TOTAL KNEE ADMISSION H&P  Patient is being admitted for left total knee arthroplasty.  Subjective:  Chief Complaint:left knee pain.  HPI: Frederick Rush, 68 y.o. male, has a history of pain and functional disability in the left knee due to arthritis and has failed non-surgical conservative treatments for greater than 12 weeks to includeNSAID's and/or analgesics, corticosteriod injections and activity modification.  Onset of symptoms was gradual, starting >10 years ago with gradually worsening course since that time. The patient noted prior procedures on the knee to include  arthroscopy and menisectomy on the left knee(s).  Patient currently rates pain in the left knee(s) at 7 out of 10 with activity. Patient has night pain, worsening of pain with activity and weight bearing, pain that interferes with activities of daily living, pain with passive range of motion, crepitus and joint swelling.  Patient has evidence of periarticular osteophytes and joint space narrowing by imaging studies. There is no active infection.  Patient Active Problem List   Diagnosis Date Noted  . Osteoarthritis of both hands 10/07/2015  . Essential hypertension 04/24/2014  . CAD (coronary artery disease), non obstructive by cath 07/19/11, treat medically 07/19/2011  . Dyslipidemia 07/16/2011  . History of smoking 07/16/2011  . Family history of coronary artery bypass surgery 07/16/2011  . Crohn disease ([place]) 07/16/2011  . Peptic ulcer disease, endo 2011 07/16/2011   Past Medical History:  Diagnosis Date  . Arthritis   . CAD (coronary artery disease), non obstructive by cath 07/19/11, treat medically 07/19/2011  . Crohn disease ([place])   . Hypercholesterolemia   . Hyperlipidemia   . Myocardial infarction ([place])   . Skin cancer 2006   Left shoulder; followed every six months.      Past Surgical History:  Procedure Laterality Date  . APPENDECTOMY    . CARDIAC CATHETERIZATION  07/15/2011   Medical management  .  FRACTURE SURGERY Right 2015   elbow  . HERNIA REPAIR    . KNEE SURGERY     arthroscopy  . LEFT HEART CATHETERIZATION WITH CORONARY ANGIOGRAM  07/19/2011   Procedure: LEFT HEART CATHETERIZATION WITH CORONARY ANGIOGRAM;  Surgeon: Leonie Man, MD;  Location: [facility] CATH LAB;  Service: Cardiovascular;;  . TONSILLECTOMY      No current facility-administered medications for this encounter.    Current Outpatient Medications  Medication Sig Dispense Refill Last Dose  . amLODipine (NORVASC) 5 MG tablet TAKE 1 TABLET(5 MG) BY MOUTH DAILY (Patient taking differently: Take 5 mg by mouth daily in the afternoon. ) 90 tablet 0   . aspirin 81 MG tablet Take 81 mg by mouth 2 (two) times a week.      . fluticasone (FLONASE) 50 MCG/ACT nasal spray Place 1 spray into both nostrils daily.      . hydroxypropyl methylcellulose / hypromellose (ISOPTO TEARS / GONIOVISC) 2.5 % ophthalmic solution Place 1 drop into both eyes 3 (three) times daily as needed (dry/irritated eyes.).     Marland Kitchen lisinopril (ZESTRIL) 20 MG tablet Take 1 tablet (20 mg total) by mouth daily. (Patient taking differently: Take 20 mg by mouth daily at 2 am. ) 90 tablet 1   . meloxicam (MOBIC) 15 MG tablet Take 1 tablet (15 mg total) by mouth daily. (Patient taking differently: Take 15 mg by mouth daily in the afternoon. ) 90 tablet 1   . mesalamine (APRISO) 0.375 g 24 hr capsule Take 4 capsules (1.5 g total) by mouth every morning. 1562m=4 capsules (Patient taking differently: Take 1,500 mg  by mouth daily at 2 am. 1526m=4 capsules) 360 capsule 1   . nitroGLYCERIN (NITROSTAT) 0.4 MG SL tablet Place 1 tablet (0.4 mg total) under the tongue every 5 (five) minutes x 3 doses as needed for chest pain. 25 tablet 4   . rosuvastatin (CRESTOR) 40 MG tablet TAKE 1 TABLET BY MOUTH DAILY (Patient taking differently: Take 40 mg by mouth daily in the afternoon. ) 90 tablet 3   . HUMIRA PEN 40 MG/0.4ML PNKT Inject 40 mg into the skin every 14 (fourteen) days.      No  Known Allergies  Social History   Tobacco Use  . Smoking status: Former Smoker    Quit date: 07/16/1998    Years since quitting: 20.4  . Smokeless tobacco: Never Used  Substance Use Topics  . Alcohol use: Yes    Alcohol/week: 8.0 standard drinks    Types: 8 Cans of beer per week    Family History  Problem Relation Age of Onset  . Cancer Mother        Breast cancer; leukemia  . Heart disease Mother 724      CABG     Review of Systems  Constitutional: Negative.   HENT: Negative.   Eyes: Negative.   Respiratory: Negative.   Cardiovascular: Negative.   Gastrointestinal: Negative.   Genitourinary: Negative.   Musculoskeletal: Positive for joint pain and myalgias. Negative for back pain, falls and neck pain.  Skin: Negative.   Neurological: Negative.   Endo/Heme/Allergies: Negative.   Psychiatric/Behavioral: Negative.     Objective:  Physical Exam  Constitutional: He is oriented to person, place, and time. He appears well-developed. No distress.  Overweight  HENT:  Head: Normocephalic and atraumatic.  Right Ear: External ear normal.  Left Ear: External ear normal.  Nose: Nose normal.  Mouth/Throat: Oropharynx is clear and moist.  Eyes: Conjunctivae and EOM are normal.  Neck: Normal range of motion. Neck supple.  Cardiovascular: Normal rate, regular rhythm, normal heart sounds and intact distal pulses.  No murmur heard. Respiratory: Effort normal and breath sounds normal. No respiratory distress. He has no wheezes.  GI: Soft. Bowel sounds are normal. He exhibits no distension. There is no abdominal tenderness.  Musculoskeletal:     Comments: Antalgic gait pattern favoring the left side with a varus thrust without the use of assistive devices.  Right Hip Exam: ROM: Normal without discomfort. There is no tenderness over the greater trochanteric bursa.  Right Knee Exam: No effusion. Range of motion is 0 to 125 degrees. No crepitus on range of motion of the  knee. No medial joint line tenderness No lateral joint line tenderness. Stable knee.  Left Hip Exam: ROM: Normal without discomfort. There is no tenderness over the greater trochanteric bursa.  Left Knee Exam: No effusion. Slight varus deformity. Range of motion is 5 to 130 degrees. Marked crepitus on range of motion of the knee. Positive medial greater than lateral joint line tenderness. Stable knee.  Neurological: He is alert and oriented to person, place, and time. He has normal strength. No sensory deficit.  Skin: No rash noted. He is not diaphoretic. No erythema.  Psychiatric: He has a normal mood and affect. His behavior is normal.    Vital Signs Ht: 5 ft 7 in  Wt: 173.7 lbs  BMI: 27.2  BP: 128/76 sitting R arm  HR:      72 bpm  Imaging Review Plain radiographs demonstrate severe degenerative joint disease of the left knee(s). The  overall alignment ismild varus. The bone quality appears to be good for age and reported activity level.    Assessment/Plan:  End stage primary osteoarthritis, left knee   The patient history, physical examination, clinical judgment of the provider and imaging studies are consistent with end stage degenerative joint disease of the left knee(s) and total knee arthroplasty is deemed medically necessary. The treatment options including medical management, injection therapy arthroscopy and arthroplasty were discussed at length. The risks and benefits of total knee arthroplasty were presented and reviewed. The risks due to aseptic loosening, infection, stiffness, patella tracking problems, thromboembolic complications and other imponderables were discussed. The patient acknowledged the explanation, agreed to proceed with the plan and consent was signed. Patient is being admitted for inpatient treatment for surgery, pain control, PT, OT, prophylactic antibiotics, VTE prophylaxis, progressive ambulation and ADL's and discharge planning. The patient is  planning to be discharged home.    Anticipated LOS equal to or greater than 2 midnights due to - Age 64 and older with one or more of the following:  - Obesity  - Expected need for hospital services (PT, OT, Nursing) required for safe  discharge  - Anticipated need for postoperative skilled nursing care or inpatient rehab  - Active co-morbidities: Coronary Artery Disease OR   - Unanticipated findings during/Post Surgery: None  - Patient is a high risk of re-admission due to: None   Risks and benefits of the surgery were discussed with the patient and Dr.Aluisio at their previous office visit, and the patient has elected to move forward with the aforementioned surgery. Post-operative care plans were discussed with the patient today.  Therapy Plans: outpatient therapy at [facility] Disposition: Home with wife Planned DVT prophylaxis: aspirin 359m BID DME needed: rolling walker PCP: Dr. ZDelia Chimes awaiting clearance, has appt on 10/30 Cardio: Dr. JQuay Burow clearance received  Other: no anesthesia concerns  Instructed patient on meds to stop prior to surgery   AArdeen Jourdain PA-C

## 2018-12-28 NOTE — Progress Notes (Signed)
Anesthesia Chart Review   Case: [id number] Date/Time: 12/31/18 1225   Procedure: TOTAL KNEE ARTHROPLASTY (Left Knee) - 25mn   Anesthesia type: Choice   Pre-op diagnosis: Left knee osteoarthritis   Location: [facility] ROOM 10 / [facility]   Surgeon: AGaynelle Arabian MD      DISCUSSION:68 y.o. former smoker (quit 07/16/98) with h/o HLD, Crohn disease, MI, nonobstructive CAD on cath 2013, left knee OA scheduled for above procedure 12/31/2018 with Dr. FGaynelle Arabian   Clearance from PCP on chart which states pt is low risk for surgical procedure.   Cleared by cardiology.  Per AFabian Sharp PA-C, "JEthelene Halwas last seen on 03/30/18 by Dr. BGwenlyn Found Since that day, JEthelene Halhas done well.  He can complete more than 4.0 METS without anginal complaints. He had a non-critical heart cath in 2013. If necessary, he may hold ASA for 5-7 days prior to surgery.  Therefore, based on ACC/AHA guidelines, the patient would be at acceptable risk for the planned procedure without further cardiovascular testing."  Anticipate pt can proceed with planned procedure barring acute status change.   VS: BP (!) 144/85   Pulse 69   Temp 36.9 C (Oral)   Resp 16   Ht 5' 8"  (1.727 m)   Wt 83.4 kg   SpO2 100%   BMI 27.95 kg/m   PROVIDERS: SForrest Moron MD is PCP   BQuay Burow MD is Cardiologist  LABS: Labs reviewed: Acceptable for surgery. (all labs ordered are listed, but only abnormal results are displayed)  Labs Reviewed  CBC WITH DIFFERENTIAL/PLATELET - Abnormal; Notable for the following components:      Result Value   Monocytes Absolute 1.1 (*)    All other components within normal limits  COMPREHENSIVE METABOLIC PANEL - Abnormal; Notable for the following components:   ALT 82 (*)    Alkaline Phosphatase 35 (*)    All other components within normal limits  SURGICAL PCR SCREEN  APTT  PROTIME-INR  TYPE AND SCREEN  ABO/RH     IMAGES:   EKG: 12/12/2018 Rate 56 bpm Sinus bradycardia Within  normal limits   CV: Cardiac Cath 07/19/2011 Impression:  Non obstructive CAD - most notable lesions in the Mid RCA but less so in the LAD. ? Potential etiology of his CP could be the RCA lesion that has stabilized with IV Heparin.    Preserved LVEF with normal LVEDP and no regional WMA. Past Medical History:  Diagnosis Date  . Arthritis   . CAD (coronary artery disease), non obstructive by cath 07/19/11, treat medically 07/19/2011  . Crohn disease ([facility]   . Hypercholesterolemia   . Hyperlipidemia   . Myocardial infarction ([facility]   . Skin cancer 2006   Left shoulder; followed every six months.      Past Surgical History:  Procedure Laterality Date  . APPENDECTOMY    . CARDIAC CATHETERIZATION  07/15/2011   Medical management  . FRACTURE SURGERY Right 2015   elbow  . HERNIA REPAIR    . KNEE SURGERY     arthroscopy  . LEFT HEART CATHETERIZATION WITH CORONARY ANGIOGRAM  07/19/2011   Procedure: LEFT HEART CATHETERIZATION WITH CORONARY ANGIOGRAM;  Surgeon: DLeonie Man MD;  [facility];  Service: Cardiovascular;;  . TONSILLECTOMY      MEDICATIONS: . amLODipine (NORVASC) 5 MG tablet  . aspirin 81 MG tablet  . fluticasone (FLONASE) 50 MCG/ACT nasal spray  . HUMIRA PEN 40 MG/0.4ML PNKT  .  hydroxypropyl methylcellulose / hypromellose (ISOPTO TEARS / GONIOVISC) 2.5 % ophthalmic solution  . lisinopril (ZESTRIL) 20 MG tablet  . meloxicam (MOBIC) 15 MG tablet  . mesalamine (APRISO) 0.375 g 24 hr capsule  . nitroGLYCERIN (NITROSTAT) 0.4 MG SL tablet  . rosuvastatin (CRESTOR) 40 MG tablet   No current facility-administered medications for this encounter.     Maia Plan [facility] Pre-Surgical Testing [phone] 12/28/18  1:14 PM

## 2018-12-28 NOTE — Anesthesia Preprocedure Evaluation (Addendum)
Anesthesia Evaluation  Patient identified by MRN, date of birth, ID band Patient awake    Reviewed: Allergy & Precautions, NPO status , Patient's Chart, lab work & pertinent test results  Airway Mallampati: II  TM Distance: >3 FB Neck ROM: Full    Dental  (+) Teeth Intact, Dental Advisory Given   Pulmonary former smoker,    breath sounds clear to auscultation       Cardiovascular hypertension, Pt. on medications + CAD   Rhythm:Regular Rate:Normal     Neuro/Psych negative neurological ROS  negative psych ROS   GI/Hepatic Neg liver ROS, PUD,   Endo/Other  negative endocrine ROS  Renal/GU negative Renal ROS     Musculoskeletal  (+) Arthritis ,   Abdominal Normal abdominal exam  (+)   Peds  Hematology negative hematology ROS (+)   Anesthesia Other Findings   Reproductive/Obstetrics                           Lab Results  Component Value Date   WBC 8.7 12/24/2018   HGB 13.3 12/24/2018   HCT 40.0 12/24/2018   MCV 93.7 12/24/2018   PLT 297 12/24/2018   Lab Results  Component Value Date   CREATININE 0.90 12/24/2018   BUN 12 12/24/2018   NA 138 12/24/2018   K 3.8 12/24/2018   CL 103 12/24/2018   CO2 27 12/24/2018   Lab Results  Component Value Date   INR 1.0 12/24/2018   INR 0.9 12/12/2018   INR 0.96 07/16/2011     Anesthesia Physical Anesthesia Plan  ASA: III  Anesthesia Plan: Spinal   Post-op Pain Management:  Regional for Post-op pain   Induction: Intravenous  PONV Risk Score and Plan: 2 and Ondansetron, Dexamethasone and Propofol infusion  Airway Management Planned: Natural Airway and Simple Face Mask  Additional Equipment: None  Intra-op Plan:   Post-operative Plan:   Informed Consent: I have reviewed the patients History and Physical, chart, labs and discussed the procedure including the risks, benefits and alternatives for the proposed anesthesia with the  patient or authorized representative who has indicated his/her understanding and acceptance.       Plan Discussed with: CRNA  Anesthesia Plan Comments: (See PAT note 12/24/2018, Konrad Felix, PA-C)      Anesthesia Quick Evaluation

## 2018-12-30 MED ORDER — BUPIVACAINE LIPOSOME 1.3 % IJ SUSP
20.0000 mL | Freq: Once | INTRAMUSCULAR | Status: DC
  Filled 2018-12-30: qty 20

## 2018-12-31 ENCOUNTER — Inpatient Hospital Stay (HOSPITAL_COMMUNITY)
Admission: RE | Admit: 2018-12-31 | Discharge: 2019-01-01 | DRG: 470 | Disposition: A | Discharge status: Home / Self Care | Payer: BC Managed Care – PPO | Source: Ambulatory Visit | Attending: Orthopedic Surgery | Admitting: Orthopedic Surgery

## 2018-12-31 ENCOUNTER — Encounter (HOSPITAL_COMMUNITY)
Admission: RE | Disposition: A | Discharge status: Home / Self Care | Payer: Self-pay | Source: Ambulatory Visit | Attending: Orthopedic Surgery

## 2018-12-31 ENCOUNTER — Other Ambulatory Visit: Payer: Self-pay

## 2018-12-31 ENCOUNTER — Inpatient Hospital Stay (HOSPITAL_COMMUNITY): Payer: BC Managed Care – PPO | Admitting: Physician Assistant

## 2018-12-31 ENCOUNTER — Inpatient Hospital Stay (HOSPITAL_COMMUNITY): Payer: BC Managed Care – PPO | Admitting: Certified Registered Nurse Anesthetist

## 2018-12-31 ENCOUNTER — Encounter (HOSPITAL_COMMUNITY): Payer: Self-pay | Admitting: Certified Registered Nurse Anesthetist

## 2018-12-31 DIAGNOSIS — Z806 Family history of leukemia: Secondary | ICD-10-CM | POA: Diagnosis not present

## 2018-12-31 DIAGNOSIS — Z79899 Other long term (current) drug therapy: Secondary | ICD-10-CM | POA: Diagnosis not present

## 2018-12-31 DIAGNOSIS — Z20828 Contact with and (suspected) exposure to other viral communicable diseases: Secondary | ICD-10-CM | POA: Diagnosis present

## 2018-12-31 DIAGNOSIS — Z8249 Family history of ischemic heart disease and other diseases of the circulatory system: Secondary | ICD-10-CM

## 2018-12-31 DIAGNOSIS — Z87891 Personal history of nicotine dependence: Secondary | ICD-10-CM

## 2018-12-31 DIAGNOSIS — Z7982 Long term (current) use of aspirin: Secondary | ICD-10-CM | POA: Diagnosis not present

## 2018-12-31 DIAGNOSIS — I2510 Atherosclerotic heart disease of native coronary artery without angina pectoris: Secondary | ICD-10-CM | POA: Diagnosis present

## 2018-12-31 DIAGNOSIS — M179 Osteoarthritis of knee, unspecified: Secondary | ICD-10-CM | POA: Diagnosis present

## 2018-12-31 DIAGNOSIS — M1712 Unilateral primary osteoarthritis, left knee: Secondary | ICD-10-CM | POA: Diagnosis present

## 2018-12-31 DIAGNOSIS — M19042 Primary osteoarthritis, left hand: Secondary | ICD-10-CM | POA: Diagnosis present

## 2018-12-31 DIAGNOSIS — I10 Essential (primary) hypertension: Secondary | ICD-10-CM | POA: Diagnosis present

## 2018-12-31 DIAGNOSIS — Z8711 Personal history of peptic ulcer disease: Secondary | ICD-10-CM | POA: Diagnosis not present

## 2018-12-31 DIAGNOSIS — Z791 Long term (current) use of non-steroidal anti-inflammatories (NSAID): Secondary | ICD-10-CM | POA: Diagnosis not present

## 2018-12-31 DIAGNOSIS — E785 Hyperlipidemia, unspecified: Secondary | ICD-10-CM | POA: Diagnosis present

## 2018-12-31 DIAGNOSIS — Z803 Family history of malignant neoplasm of breast: Secondary | ICD-10-CM | POA: Diagnosis not present

## 2018-12-31 DIAGNOSIS — Z85828 Personal history of other malignant neoplasm of skin: Secondary | ICD-10-CM

## 2018-12-31 DIAGNOSIS — I252 Old myocardial infarction: Secondary | ICD-10-CM

## 2018-12-31 DIAGNOSIS — M1710 Unilateral primary osteoarthritis, unspecified knee: Secondary | ICD-10-CM | POA: Diagnosis present

## 2018-12-31 DIAGNOSIS — K5090 Crohn's disease, unspecified, without complications: Secondary | ICD-10-CM | POA: Diagnosis present

## 2018-12-31 DIAGNOSIS — M19041 Primary osteoarthritis, right hand: Secondary | ICD-10-CM | POA: Diagnosis present

## 2018-12-31 HISTORY — PX: TOTAL KNEE ARTHROPLASTY: SHX125

## 2018-12-31 LAB — TYPE AND SCREEN
ABO/RH(D): A POS
Antibody Screen: NEGATIVE

## 2018-12-31 SURGERY — ARTHROPLASTY, KNEE, TOTAL
Anesthesia: General | Site: Knee | Laterality: Left

## 2018-12-31 MED ORDER — ONDANSETRON HCL 4 MG/2ML IJ SOLN
INTRAMUSCULAR | Status: AC
  Filled 2018-12-31: qty 2

## 2018-12-31 MED ORDER — AMLODIPINE BESYLATE 5 MG PO TABS: 5.0000 mg | ORAL_TABLET | Freq: Every day | ORAL | Status: DC

## 2018-12-31 MED ORDER — BISACODYL 10 MG RE SUPP: 10.0000 mg | Freq: Every day | RECTAL | Status: DC | PRN

## 2018-12-31 MED ORDER — DEXAMETHASONE SODIUM PHOSPHATE 10 MG/ML IJ SOLN
10.0000 mg | Freq: Once | INTRAMUSCULAR | Status: AC
  Administered 2019-01-01: 10 mg via INTRAVENOUS
  Filled 2018-12-31: qty 1

## 2018-12-31 MED ORDER — ACETAMINOPHEN 10 MG/ML IV SOLN
1000.0000 mg | Freq: Four times a day (QID) | INTRAVENOUS | Status: DC
  Administered 2018-12-31: 1000 mg via INTRAVENOUS
  Filled 2018-12-31: qty 100

## 2018-12-31 MED ORDER — MIDAZOLAM HCL 2 MG/2ML IJ SOLN
1.0000 mg | INTRAMUSCULAR | Status: DC
  Administered 2018-12-31: 1 mg via INTRAVENOUS
  Filled 2018-12-31: qty 2

## 2018-12-31 MED ORDER — SODIUM CHLORIDE 0.9 % IV SOLN
INTRAVENOUS | Status: DC
  Administered 2018-12-31 – 2019-01-01 (×2): via INTRAVENOUS

## 2018-12-31 MED ORDER — LACTATED RINGERS IV SOLN: INTRAVENOUS | Status: DC

## 2018-12-31 MED ORDER — ONDANSETRON HCL 4 MG PO TABS: 4.0000 mg | ORAL_TABLET | Freq: Four times a day (QID) | ORAL | Status: DC | PRN

## 2018-12-31 MED ORDER — FLUTICASONE PROPIONATE 50 MCG/ACT NA SUSP
1.0000 | Freq: Every day | NASAL | Status: DC
  Filled 2018-12-31: qty 16

## 2018-12-31 MED ORDER — ACETAMINOPHEN 10 MG/ML IV SOLN: 1000.0000 mg | Freq: Once | INTRAVENOUS | Status: DC | PRN

## 2018-12-31 MED ORDER — FLEET ENEMA 7-19 GM/118ML RE ENEM: 1.0000 | ENEMA | Freq: Once | RECTAL | Status: DC | PRN

## 2018-12-31 MED ORDER — GABAPENTIN 300 MG PO CAPS
300.0000 mg | ORAL_CAPSULE | Freq: Three times a day (TID) | ORAL | Status: DC
  Administered 2018-12-31 – 2019-01-01 (×2): 300 mg via ORAL
  Filled 2018-12-31 (×2): qty 1

## 2018-12-31 MED ORDER — ROSUVASTATIN CALCIUM 20 MG PO TABS
40.0000 mg | ORAL_TABLET | Freq: Every day | ORAL | Status: DC
  Filled 2018-12-31: qty 1

## 2018-12-31 MED ORDER — ACETAMINOPHEN 160 MG/5ML PO SOLN: 325.0000 mg | Freq: Once | ORAL | Status: DC | PRN

## 2018-12-31 MED ORDER — MENTHOL 3 MG MT LOZG: 1.0000 | LOZENGE | OROMUCOSAL | Status: DC | PRN

## 2018-12-31 MED ORDER — PROPOFOL 500 MG/50ML IV EMUL
INTRAVENOUS | Status: DC | PRN
  Administered 2018-12-31: 50 ug/kg/min via INTRAVENOUS

## 2018-12-31 MED ORDER — METHOCARBAMOL 500 MG PO TABS
500.0000 mg | ORAL_TABLET | Freq: Four times a day (QID) | ORAL | Status: DC | PRN
  Administered 2019-01-01: 500 mg via ORAL
  Filled 2018-12-31: qty 1

## 2018-12-31 MED ORDER — GLYCOPYRROLATE PF 0.2 MG/ML IJ SOSY
PREFILLED_SYRINGE | INTRAMUSCULAR | Status: DC | PRN
  Administered 2018-12-31: .2 mg via INTRAVENOUS

## 2018-12-31 MED ORDER — EPHEDRINE SULFATE-NACL 50-0.9 MG/10ML-% IV SOSY
PREFILLED_SYRINGE | INTRAVENOUS | Status: DC | PRN
  Administered 2018-12-31 (×4): 5 mg via INTRAVENOUS
  Administered 2018-12-31: 10 mg via INTRAVENOUS

## 2018-12-31 MED ORDER — PHENYLEPHRINE 40 MCG/ML (10ML) SYRINGE FOR IV PUSH (FOR BLOOD PRESSURE SUPPORT)
PREFILLED_SYRINGE | INTRAVENOUS | Status: DC | PRN
  Administered 2018-12-31 (×4): 80 ug via INTRAVENOUS

## 2018-12-31 MED ORDER — METOCLOPRAMIDE HCL 5 MG/ML IJ SOLN: 5.0000 mg | Freq: Three times a day (TID) | INTRAMUSCULAR | Status: DC | PRN

## 2018-12-31 MED ORDER — PROPOFOL 10 MG/ML IV BOLUS
INTRAVENOUS | Status: AC
  Filled 2018-12-31: qty 20

## 2018-12-31 MED ORDER — ONDANSETRON HCL 4 MG/2ML IJ SOLN
INTRAMUSCULAR | Status: DC | PRN
  Administered 2018-12-31: 4 mg via INTRAVENOUS

## 2018-12-31 MED ORDER — PHENYLEPHRINE 40 MCG/ML (10ML) SYRINGE FOR IV PUSH (FOR BLOOD PRESSURE SUPPORT)
PREFILLED_SYRINGE | INTRAVENOUS | Status: AC
  Filled 2018-12-31: qty 10

## 2018-12-31 MED ORDER — DEXAMETHASONE SODIUM PHOSPHATE 10 MG/ML IJ SOLN: 8.0000 mg | Freq: Once | INTRAMUSCULAR | Status: DC

## 2018-12-31 MED ORDER — HYDROMORPHONE HCL 1 MG/ML IJ SOLN
INTRAMUSCULAR | Status: AC
  Filled 2018-12-31: qty 2

## 2018-12-31 MED ORDER — FENTANYL CITRATE (PF) 100 MCG/2ML IJ SOLN
50.0000 ug | INTRAMUSCULAR | Status: DC
  Administered 2018-12-31: 50 ug via INTRAVENOUS
  Filled 2018-12-31: qty 2

## 2018-12-31 MED ORDER — ACETAMINOPHEN 325 MG PO TABS: 325.0000 mg | ORAL_TABLET | Freq: Once | ORAL | Status: DC | PRN

## 2018-12-31 MED ORDER — ONDANSETRON HCL 4 MG/2ML IJ SOLN: 4.0000 mg | Freq: Four times a day (QID) | INTRAMUSCULAR | Status: DC | PRN

## 2018-12-31 MED ORDER — TRAMADOL HCL 50 MG PO TABS
50.0000 mg | ORAL_TABLET | Freq: Four times a day (QID) | ORAL | Status: DC | PRN
  Administered 2019-01-01: 100 mg via ORAL
  Filled 2018-12-31: qty 2

## 2018-12-31 MED ORDER — GLYCOPYRROLATE PF 0.2 MG/ML IJ SOSY
PREFILLED_SYRINGE | INTRAMUSCULAR | Status: AC
  Filled 2018-12-31: qty 1

## 2018-12-31 MED ORDER — DOCUSATE SODIUM 100 MG PO CAPS
100.0000 mg | ORAL_CAPSULE | Freq: Two times a day (BID) | ORAL | Status: DC
  Administered 2018-12-31 – 2019-01-01 (×2): 100 mg via ORAL
  Filled 2018-12-31 (×2): qty 1

## 2018-12-31 MED ORDER — POLYETHYLENE GLYCOL 3350 17 G PO PACK: 17.0000 g | PACK | Freq: Every day | ORAL | Status: DC | PRN

## 2018-12-31 MED ORDER — PROPOFOL 500 MG/50ML IV EMUL
INTRAVENOUS | Status: AC
  Filled 2018-12-31: qty 50

## 2018-12-31 MED ORDER — ASPIRIN EC 325 MG PO TBEC
325.0000 mg | DELAYED_RELEASE_TABLET | Freq: Two times a day (BID) | ORAL | Status: DC
  Administered 2019-01-01: 325 mg via ORAL
  Filled 2018-12-31: qty 1

## 2018-12-31 MED ORDER — POLYVINYL ALCOHOL 1.4 % OP SOLN: 1.0000 [drp] | Freq: Three times a day (TID) | OPHTHALMIC | Status: DC | PRN

## 2018-12-31 MED ORDER — ROPIVACAINE HCL 7.5 MG/ML IJ SOLN
INTRAMUSCULAR | Status: DC | PRN
  Administered 2018-12-31: 20 mL via PERINEURAL

## 2018-12-31 MED ORDER — MORPHINE SULFATE (PF) 4 MG/ML IV SOLN: 1.0000 mg | INTRAVENOUS | Status: DC | PRN

## 2018-12-31 MED ORDER — SODIUM CHLORIDE (PF) 0.9 % IJ SOLN
INTRAMUSCULAR | Status: DC | PRN
  Administered 2018-12-31: 60 mL

## 2018-12-31 MED ORDER — SODIUM CHLORIDE (PF) 0.9 % IJ SOLN
INTRAMUSCULAR | Status: AC
  Filled 2018-12-31: qty 10

## 2018-12-31 MED ORDER — OXYCODONE HCL 5 MG PO TABS
5.0000 mg | ORAL_TABLET | ORAL | Status: DC | PRN
  Administered 2018-12-31 – 2019-01-01 (×2): 10 mg via ORAL
  Filled 2018-12-31 (×2): qty 2

## 2018-12-31 MED ORDER — SODIUM CHLORIDE (PF) 0.9 % IJ SOLN
INTRAMUSCULAR | Status: AC
  Filled 2018-12-31: qty 50

## 2018-12-31 MED ORDER — LACTATED RINGERS IV SOLN
INTRAVENOUS | Status: DC
  Administered 2018-12-31 (×2): via INTRAVENOUS

## 2018-12-31 MED ORDER — MEPERIDINE HCL 50 MG/ML IJ SOLN: 6.2500 mg | INTRAMUSCULAR | Status: DC | PRN

## 2018-12-31 MED ORDER — DEXAMETHASONE SODIUM PHOSPHATE 10 MG/ML IJ SOLN
INTRAMUSCULAR | Status: DC | PRN
  Administered 2018-12-31: 7 mg via INTRAVENOUS

## 2018-12-31 MED ORDER — METOCLOPRAMIDE HCL 5 MG PO TABS: 5.0000 mg | ORAL_TABLET | Freq: Three times a day (TID) | ORAL | Status: DC | PRN

## 2018-12-31 MED ORDER — CEFAZOLIN SODIUM-DEXTROSE 2-4 GM/100ML-% IV SOLN
2.0000 g | INTRAVENOUS | Status: AC
  Administered 2018-12-31: 2 g via INTRAVENOUS
  Filled 2018-12-31: qty 100

## 2018-12-31 MED ORDER — DIPHENHYDRAMINE HCL 12.5 MG/5ML PO ELIX: 12.5000 mg | ORAL_SOLUTION | ORAL | Status: DC | PRN

## 2018-12-31 MED ORDER — TRANEXAMIC ACID-NACL 1000-0.7 MG/100ML-% IV SOLN
1000.0000 mg | INTRAVENOUS | Status: AC
  Administered 2018-12-31: 1000 mg via INTRAVENOUS
  Filled 2018-12-31: qty 100

## 2018-12-31 MED ORDER — HYDROMORPHONE HCL 1 MG/ML IJ SOLN
0.2500 mg | INTRAMUSCULAR | Status: DC | PRN
  Administered 2018-12-31 (×4): 0.5 mg via INTRAVENOUS

## 2018-12-31 MED ORDER — FENTANYL CITRATE (PF) 100 MCG/2ML IJ SOLN
INTRAMUSCULAR | Status: DC | PRN
  Administered 2018-12-31 (×2): 25 ug via INTRAVENOUS
  Administered 2018-12-31: 50 ug via INTRAVENOUS

## 2018-12-31 MED ORDER — METHOCARBAMOL 500 MG IVPB - SIMPLE MED
500.0000 mg | Freq: Four times a day (QID) | INTRAVENOUS | Status: DC | PRN
  Administered 2018-12-31: 500 mg via INTRAVENOUS
  Filled 2018-12-31: qty 50

## 2018-12-31 MED ORDER — DEXAMETHASONE SODIUM PHOSPHATE 10 MG/ML IJ SOLN
INTRAMUSCULAR | Status: AC
  Filled 2018-12-31: qty 1

## 2018-12-31 MED ORDER — CHLORHEXIDINE GLUCONATE 4 % EX LIQD: 60.0000 mL | Freq: Once | CUTANEOUS | Status: DC

## 2018-12-31 MED ORDER — PHENOL 1.4 % MT LIQD: 1.0000 | OROMUCOSAL | Status: DC | PRN

## 2018-12-31 MED ORDER — ACETAMINOPHEN 500 MG PO TABS
1000.0000 mg | ORAL_TABLET | Freq: Four times a day (QID) | ORAL | Status: DC
  Administered 2018-12-31 – 2019-01-01 (×3): 1000 mg via ORAL
  Filled 2018-12-31 (×3): qty 2

## 2018-12-31 MED ORDER — BUPIVACAINE LIPOSOME 1.3 % IJ SUSP
INTRAMUSCULAR | Status: DC | PRN
  Administered 2018-12-31: 20 mL

## 2018-12-31 MED ORDER — FENTANYL CITRATE (PF) 100 MCG/2ML IJ SOLN
INTRAMUSCULAR | Status: AC
  Filled 2018-12-31: qty 2

## 2018-12-31 MED ORDER — CEFAZOLIN SODIUM-DEXTROSE 2-4 GM/100ML-% IV SOLN
2.0000 g | Freq: Four times a day (QID) | INTRAVENOUS | Status: AC
  Administered 2018-12-31 – 2019-01-01 (×2): 2 g via INTRAVENOUS
  Filled 2018-12-31 (×2): qty 100

## 2018-12-31 MED ORDER — POVIDONE-IODINE 10 % EX SWAB
2.0000 "application " | Freq: Once | CUTANEOUS | Status: AC
  Administered 2018-12-31: 2 via TOPICAL

## 2018-12-31 MED ORDER — SODIUM CHLORIDE 0.9 % IR SOLN
Status: DC | PRN
  Administered 2018-12-31: 1000 mL

## 2018-12-31 MED ORDER — METHOCARBAMOL 500 MG IVPB - SIMPLE MED
INTRAVENOUS | Status: AC
  Filled 2018-12-31: qty 50

## 2018-12-31 MED ORDER — PROPOFOL 10 MG/ML IV BOLUS
INTRAVENOUS | Status: DC | PRN
  Administered 2018-12-31 (×2): 10 mg via INTRAVENOUS
  Administered 2018-12-31: 20 mg via INTRAVENOUS
  Administered 2018-12-31: 70 mg via INTRAVENOUS
  Administered 2018-12-31: 20 mg via INTRAVENOUS
  Administered 2018-12-31: 10 mg via INTRAVENOUS
  Administered 2018-12-31: 60 mg via INTRAVENOUS

## 2018-12-31 MED ORDER — PROMETHAZINE HCL 25 MG/ML IJ SOLN: 6.2500 mg | INTRAMUSCULAR | Status: DC | PRN

## 2018-12-31 MED ORDER — NITROGLYCERIN 0.4 MG SL SUBL: 0.4000 mg | SUBLINGUAL_TABLET | SUBLINGUAL | Status: DC | PRN

## 2018-12-31 MED ORDER — BUPIVACAINE IN DEXTROSE 0.75-8.25 % IT SOLN
INTRATHECAL | Status: DC | PRN
  Administered 2018-12-31: 1.6 mL via INTRATHECAL

## 2018-12-31 SURGICAL SUPPLY — 58 items
ATTUNE MED DOME PAT 38 KNEE (Knees) ×2 IMPLANT
ATTUNE PS FEM LT SZ 7 CEM KNEE (Femur) ×2 IMPLANT
ATTUNE PSRP INSR SZ7 7 KNEE (Insert) ×2 IMPLANT
BAG SPEC THK2 15X12 ZIP CLS (MISCELLANEOUS) ×1
BAG ZIPLOCK 12X15 (MISCELLANEOUS) ×2 IMPLANT
BASE TIBIAL ROT PLAT SZ 7 KNEE (Knees) ×1 IMPLANT
BLADE SAG 18X100X1.27 (BLADE) ×2 IMPLANT
BLADE SAW SGTL 11.0X1.19X90.0M (BLADE) ×2 IMPLANT
BLADE SURG SZ10 CARB STEEL (BLADE) ×4 IMPLANT
BNDG ELASTIC 6X5.8 VLCR STR LF (GAUZE/BANDAGES/DRESSINGS) ×2 IMPLANT
BOWL SMART MIX CTS (DISPOSABLE) ×2 IMPLANT
BSPLAT TIB 7 CMNT ROT PLAT STR (Knees) ×1 IMPLANT
CEMENT HV SMART SET (Cement) ×4 IMPLANT
COVER SURGICAL LIGHT HANDLE (MISCELLANEOUS) ×2 IMPLANT
COVER WAND RF STERILE (DRAPES) IMPLANT
CUFF TOURN SGL QUICK 34 (TOURNIQUET CUFF) ×2
CUFF TRNQT CYL 34X4.125X (TOURNIQUET CUFF) ×1 IMPLANT
DECANTER SPIKE VIAL GLASS SM (MISCELLANEOUS) ×2 IMPLANT
DRAPE U-SHAPE 47X51 STRL (DRAPES) ×2 IMPLANT
DRSG ADAPTIC 3X8 NADH LF (GAUZE/BANDAGES/DRESSINGS) ×2 IMPLANT
DRSG PAD ABDOMINAL 8X10 ST (GAUZE/BANDAGES/DRESSINGS) ×2 IMPLANT
DURAPREP 26ML APPLICATOR (WOUND CARE) ×2 IMPLANT
ELECT REM PT RETURN 15FT ADLT (MISCELLANEOUS) ×2 IMPLANT
EVACUATOR 1/8 PVC DRAIN (DRAIN) ×2 IMPLANT
GAUZE SPONGE 4X4 12PLY STRL (GAUZE/BANDAGES/DRESSINGS) ×2 IMPLANT
GLOVE BIO SURGEON STRL SZ7 (GLOVE) ×2 IMPLANT
GLOVE BIO SURGEON STRL SZ8 (GLOVE) ×2 IMPLANT
GLOVE BIOGEL PI IND STRL 6.5 (GLOVE) ×1 IMPLANT
GLOVE BIOGEL PI IND STRL 7.0 (GLOVE) ×1 IMPLANT
GLOVE BIOGEL PI IND STRL 8 (GLOVE) ×1 IMPLANT
GLOVE BIOGEL PI INDICATOR 6.5 (GLOVE) ×1
GLOVE BIOGEL PI INDICATOR 7.0 (GLOVE) ×1
GLOVE BIOGEL PI INDICATOR 8 (GLOVE) ×1
GLOVE SURG SS PI 6.5 STRL IVOR (GLOVE) ×2 IMPLANT
GOWN STRL REUS W/TWL LRG LVL3 (GOWN DISPOSABLE) ×6 IMPLANT
HANDPIECE INTERPULSE COAX TIP (DISPOSABLE) ×2
HOLDER FOLEY CATH W/STRAP (MISCELLANEOUS) IMPLANT
IMMOBILIZER KNEE 20 (SOFTGOODS) ×2
IMMOBILIZER KNEE 20 THIGH 36 (SOFTGOODS) ×1 IMPLANT
KIT TURNOVER KIT A (KITS) IMPLANT
MANIFOLD NEPTUNE II (INSTRUMENTS) ×2 IMPLANT
NS IRRIG 1000ML POUR BTL (IV SOLUTION) ×2 IMPLANT
PACK TOTAL KNEE CUSTOM (KITS) ×2 IMPLANT
PADDING CAST COTTON 6X4 STRL (CAST SUPPLIES) ×6 IMPLANT
PIN STEINMAN FIXATION KNEE (PIN) ×2 IMPLANT
PROTECTOR NERVE ULNAR (MISCELLANEOUS) ×2 IMPLANT
SET HNDPC FAN SPRY TIP SCT (DISPOSABLE) ×1 IMPLANT
STRIP CLOSURE SKIN 1/2X4 (GAUZE/BANDAGES/DRESSINGS) ×2 IMPLANT
SUT MNCRL AB 4-0 PS2 18 (SUTURE) ×2 IMPLANT
SUT STRATAFIX 0 PDS 27 VIOLET (SUTURE) ×2
SUT VIC AB 2-0 CT1 27 (SUTURE) ×6
SUT VIC AB 2-0 CT1 TAPERPNT 27 (SUTURE) ×3 IMPLANT
SUTURE STRATFX 0 PDS 27 VIOLET (SUTURE) ×1 IMPLANT
TIBIAL BASE ROT PLAT SZ 7 KNEE (Knees) ×2 IMPLANT
TRAY FOLEY MTR SLVR 16FR STAT (SET/KITS/TRAYS/PACK) ×2 IMPLANT
WATER STERILE IRR 1000ML POUR (IV SOLUTION) ×4 IMPLANT
WRAP KNEE MAXI GEL POST OP (GAUZE/BANDAGES/DRESSINGS) ×2 IMPLANT
YANKAUER SUCT BULB TIP 10FT TU (MISCELLANEOUS) ×2 IMPLANT

## 2018-12-31 NOTE — Op Note (Signed)
OPERATIVE REPORT-TOTAL KNEE ARTHROPLASTY   Pre-operative diagnosis- Osteoarthritis  Left knee(s)  Post-operative diagnosis- Osteoarthritis Left knee(s)  Procedure-  Left  Total Knee Arthroplasty  Surgeon- Dione Plover. Ellary Casamento, MD  Assistant- Molli Barrows, PA-C   Anesthesia-  Adductor canal block and spinal  EBL-20 mL   Drains Hemovac  Tourniquet time-  Total Tourniquet Time Documented: Thigh (Left) - 48 minutes Total: Thigh (Left) - 48 minutes     Complications- None  Condition-PACU - hemodynamically stable.   Brief Clinical Note -   Frederick Rush is a 68 y.o. year old maleyear old male with end stage OA of his left knee with progressively worsening pain and dysfunction. He has constant pain, with activity and at rest and significant functional deficits with difficulties even with ADLs. He has had extensive non-op management including analgesics, injections of cortisone and viscosupplements, and home exercise program, but remains in significant pain with significant dysfunction. Radiographs show bone on bone arthritis medial and patellofemoral. He presents now for left Total Knee Arthroplasty.        Procedure in detail---   The patient is brought into the operating room and positioned supine on the operating table. After successful administration of  Adductor canal block and spinal,   a tourniquet is placed high on the  Left thigh(s) and the lower extremity is prepped and draped in the usual sterile fashion. Time out is performed by the operating team and then the  Left lower extremity is wrapped in Esmarch, knee flexed and the tourniquet inflated to 300 mmHg.       A midline incision is made with a ten blade through the subcutaneous tissue to the level of the extensor mechanism. A fresh blade is used to make a medial parapatellar arthrotomy. Soft tissue over the proximal medial tibia is subperiosteally elevated to the joint line with a knife and into the semimembranosus bursa with a Cobb  elevator. Soft tissue over the proximal lateral tibia is elevated with attention being paid to avoiding the patellar tendon on the tibial tubercle. The patella is everted, knee flexed 90 degrees and the ACL and PCL are removed. Findings are bone on bone medial and patellofemoral with large global osteophytes.        The drill is used to create a starting hole in the distal femur and the canal is thoroughly irrigated with sterile saline to remove the fatty contents. The 5 degree Left  valgus alignment guide is placed into the femoral canal and the distal femoral cutting block is pinned to remove 9 mm off the distal femur. Resection is made with an oscillating saw.      The tibia is subluxed forward and the menisci are removed. The extramedullary alignment guide is placed referencing proximally at the medial aspect of the tibial tubercle and distally along the second metatarsal axis and tibial crest. The block is pinned to remove 68m off the more deficient medial  side. Resection is made with an oscillating saw. Size 7is the most appropriate size for the tibia and the proximal tibia is prepared with the modular drill and keel punch for that size.      The femoral sizing guide is placed and size 7 is most appropriate. Rotation is marked off the epicondylar axis and confirmed by creating a rectangular flexion gap at 90 degrees. The size 7 cutting block is pinned in this rotation and the anterior, posterior and chamfer cuts are made with the oscillating saw. The intercondylar block is then placed  and that cut is made.      Trial size 7 tibial component, trial size 7 posterior stabilized femur and a 7  mm posterior stabilized rotating platform insert trial is placed. Full extension is achieved with excellent varus/valgus and anterior/posterior balance throughout full range of motion. The patella is everted and thickness measured to be 24  mm. Free hand resection is taken to 14 mm, a 38 template is placed, lug holes  are drilled, trial patella is placed, and it tracks normally. Osteophytes are removed off the posterior femur with the trial in place. All trials are removed and the cut bone surfaces prepared with pulsatile lavage. Cement is mixed and once ready for implantation, the size 7 tibial implant, size  7 posterior stabilized femoral component, and the size 38 patella are cemented in place and the patella is held with the clamp. The trial insert is placed and the knee held in full extension. The Exparel (20 ml mixed with 60 ml saline) is injected into the extensor mechanism, posterior capsule, medial and lateral gutters and subcutaneous tissues.  All extruded cement is removed and once the cement is hard the permanent 7 mm posterior stabilized rotating platform insert is placed into the tibial tray.      The wound is copiously irrigated with saline solution and the extensor mechanism closed over a hemovac drain with #1 V-loc suture. The tourniquet is released for a total tourniquet time of 48  minutes. Flexion against gravity is 140 degrees and the patella tracks normally. Subcutaneous tissue is closed with 2.0 vicryl and subcuticular with running 4.0 Monocryl. The incision is cleaned and dried and steri-strips and a bulky sterile dressing are applied. The limb is placed into a knee immobilizer and the patient is awakened and transported to recovery in stable condition.      Please note that a surgical assistant was a medical necessity for this procedure in order to perform it in a safe and expeditious manner. Surgical assistant was necessary to retract the ligaments and vital neurovascular structures to prevent injury to them and also necessary for proper positioning of the limb to allow for anatomic placement of the prosthesis.   Dione Plover Shenise Wolgamott, MD    12/31/2018, 1:58 PM  ;

## 2018-12-31 NOTE — Discharge Instructions (Addendum)
Dr. Gaynelle Arabian Total Joint Specialist [facility] [address], [place], [place] [address] [phone]  TOTAL KNEE REPLACEMENT POSTOPERATIVE DIRECTIONS  Knee Rehabilitation, Guidelines Following Surgery  Results after knee surgery are often greatly improved when you follow the exercise, range of motion and muscle strengthening exercises prescribed by your doctor. Safety measures are also important to protect the knee from further injury. Any time any of these exercises cause you to have increased pain or swelling in your knee joint, decrease the amount until you are comfortable again and slowly increase them. If you have problems or questions, call your caregiver or physical therapist for advice.   HOME CARE INSTRUCTIONS  Remove items at home which could result in a fall. This includes throw rugs or furniture in walking pathways.   ICE to the affected knee every three hours for 30 minutes at a time and then as needed for pain and swelling.  Continue to use ice on the knee for pain and swelling from surgery. You may notice swelling that will progress down to the foot and ankle.  This is normal after surgery.  Elevate the leg when you are not up walking on it.    Continue to use the breathing machine which will help keep your temperature down.  It is common for your temperature to cycle up and down following surgery, especially at night when you are not up moving around and exerting yourself.  The breathing machine keeps your lungs expanded and your temperature down.  Do not place pillow under knee, focus on keeping the knee straight while resting  DIET You may resume your previous home diet once your are discharged from the hospital.  DRESSING / WOUND CARE / SHOWERING You may shower 3 days after surgery, but keep the wounds dry during showering.  You may use an occlusive plastic wrap (Press'n Seal for example), NO SOAKING/SUBMERGING IN THE BATHTUB.  If the bandage gets  wet, change with a clean dry gauze.  If the incision gets wet, pat the wound dry with a clean towel. You may start showering once you are discharged home but do not submerge the incision under water. Just pat the incision dry and apply a dry gauze dressing on daily. Change the surgical dressing daily and reapply a dry dressing each time.  ACTIVITY Walk with your walker as instructed. Use walker as long as suggested by your caregivers. Avoid periods of inactivity such as sitting longer than an hour when not asleep. This helps prevent blood clots.  You may resume a sexual relationship in one month or when given the OK by your doctor.  You may return to work once you are cleared by your doctor.  Do not drive a car for 6 weeks or until released by you surgeon.  Do not drive while taking narcotics.  WEIGHT BEARING Weight bearing as tolerated with assist device (walker, cane, etc) as directed, use it as long as suggested by your surgeon or therapist, typically at least 4-6 weeks.  POSTOPERATIVE CONSTIPATION PROTOCOL Constipation - defined medically as fewer than three stools per week and severe constipation as less than one stool per week.  One of the most common issues patients have following surgery is constipation.  Even if you have a regular bowel pattern at home, your normal regimen is likely to be disrupted due to multiple reasons following surgery.  Combination of anesthesia, postoperative narcotics, change in appetite and fluid intake all can affect your bowels.  In order to avoid complications following surgery, here are some recommendations in order to help you during your recovery period.  Colace (docusate) - Pick up an over-the-counter form of Colace or another stool softener and take twice a day as long as you are requiring postoperative pain medications.  Take with a full glass of water daily.  If you experience loose stools or diarrhea, hold the colace until you stool forms back up.  If  your symptoms do not get better within 1 week or if they get worse, check with your doctor.  Dulcolax (bisacodyl) - Pick up over-the-counter and take as directed by the product packaging as needed to assist with the movement of your bowels.  Take with a full glass of water.  Use this product as needed if not relieved by Colace only.   MiraLax (polyethylene glycol) - Pick up over-the-counter to have on hand.  MiraLax is a solution that will increase the amount of water in your bowels to assist with bowel movements.  Take as directed and can mix with a glass of water, juice, soda, coffee, or tea.  Take if you go more than two days without a movement. Do not use MiraLax more than once per day. Call your doctor if you are still constipated or irregular after using this medication for 7 days in a row.  If you continue to have problems with postoperative constipation, please contact the office for further assistance and recommendations.  If you experience "the worst abdominal pain ever" or develop nausea or vomiting, please contact the office immediatly for further recommendations for treatment.  ITCHING  If you experience itching with your medications, try taking only a single pain pill, or even half a pain pill at a time.  You can also use Benadryl over the counter for itching or also to help with sleep.   TED HOSE STOCKINGS Wear the elastic stockings on both legs for three weeks following surgery during the day but you may remove then at night for sleeping.  MEDICATIONS See your medication summary on the After Visit Summary that the nursing staff will review with you prior to discharge.  You may have some home medications which will be placed on hold until you complete the course of blood thinner medication.  It is important for you to complete the blood thinner medication as prescribed by your surgeon.  Continue your approved medications as instructed at time of discharge.  Gabapentin 300 mg  Protocol Take a 300 mg capsule three times a day for two weeks, Then a 300 mg capsule twice a day for two weeks, Then a 300 mg capsule once a day for two weeks, then discontinue the Gabapentin.  PRECAUTIONS If you experience chest pain or shortness of breath - call 911 immediately for transfer to the hospital emergency department.  If you develop a fever greater that 101 F, purulent drainage from wound, increased redness or drainage from wound, foul odor from the wound/dressing, or calf pain - CONTACT YOUR SURGEON.                                                   FOLLOW-UP APPOINTMENTS Make sure you keep all of your appointments after your operation with your surgeon and caregivers. You should call the office at the above phone number and make an appointment for  approximately two weeks after the date of your surgery or on the date instructed by your surgeon outlined in the "After Visit Summary".   RANGE OF MOTION AND STRENGTHENING EXERCISES  Rehabilitation of the knee is important following a knee injury or an operation. After just a few days of immobilization, the muscles of the thigh which control the knee become weakened and shrink (atrophy). Knee exercises are designed to build up the tone and strength of the thigh muscles and to improve knee motion. Often times heat used for twenty to thirty minutes before working out will loosen up your tissues and help with improving the range of motion but do not use heat for the first two weeks following surgery. These exercises can be done on a training (exercise) mat, on the floor, on a table or on a bed. Use what ever works the best and is most comfortable for you Knee exercises include:  Leg Lifts - While your knee is still immobilized in a splint or cast, you can do straight leg raises. Lift the leg to 60 degrees, hold for 3 sec, and slowly lower the leg. Repeat 10-20 times 2-3 times daily. Perform this exercise against resistance later as your knee  gets better.  Quad and Hamstring Sets - Tighten up the muscle on the front of the thigh (Quad) and hold for 5-10 sec. Repeat this 10-20 times hourly. Hamstring sets are done by pushing the foot backward against an object and holding for 5-10 sec. Repeat as with quad sets.   Leg Slides: Lying on your back, slowly slide your foot toward your buttocks, bending your knee up off the floor (only go as far as is comfortable). Then slowly slide your foot back down until your leg is flat on the floor again.  Angel Wings: Lying on your back spread your legs to the side as far apart as you can without causing discomfort.  A rehabilitation program following serious knee injuries can speed recovery and prevent re-injury in the future due to weakened muscles. Contact your doctor or a physical therapist for more information on knee rehabilitation.   IF YOU ARE TRANSFERRED TO A SKILLED REHAB FACILITY If the patient is transferred to a skilled rehab facility following release from the hospital, a list of the current medications will be sent to the facility for the patient to continue.  When discharged from the skilled rehab facility, please have the facility set up the patient's Industry prior to being released. Also, the skilled facility will be responsible for providing the patient with their medications at time of release from the facility to include their pain medication, the muscle relaxants, and their blood thinner medication. If the patient is still at the rehab facility at time of the two week follow up appointment, the skilled rehab facility will also need to assist the patient in arranging follow up appointment in our office and any transportation needs.  MAKE SURE YOU:  Understand these instructions.  Get help right away if you are not doing well or get worse.    Pick up stool softner and laxative for home use following surgery while on pain medications. Do not submerge incision under  water. Please use good hand washing techniques while changing dressing each day. May shower starting three days after surgery. Please use a clean towel to pat the incision dry following showers. Continue to use ice for pain and swelling after surgery. Do not use any lotions or creams on the  incision until instructed by your surgeon.

## 2018-12-31 NOTE — Anesthesia Procedure Notes (Signed)
Anesthesia Regional Block: Adductor canal block   Pre-Anesthetic Checklist: ,, timeout performed, Correct Patient, Correct Site, Correct Laterality, Correct Procedure, Correct Position, site marked, Risks and benefits discussed,  Surgical consent,  Pre-op evaluation,  At surgeon's request and post-op pain management  Laterality: Left  Prep: chloraprep       Needles:  Injection technique: Single-shot  Needle Type: Echogenic Stimulator Needle     Needle Length: 9cm  Needle Gauge: 21     Additional Needles:   Procedures:,,,, ultrasound used (permanent image in chart),,,,  Narrative:  Start time: 12/31/2018 11:35 AM End time: 12/31/2018 11:45 AM Injection made incrementally with aspirations every 5 mL.  Performed by: Personally  Anesthesiologist: Effie Berkshire, MD  Additional Notes: Patient tolerated the procedure well. Local anesthetic introduced in an incremental fashion under minimal resistance after negative aspirations. No paresthesias were elicited. After completion of the procedure, no acute issues were identified and patient continued to be monitored by RN.

## 2018-12-31 NOTE — Interval H&P Note (Signed)
History and Physical Interval Note:  12/31/2018 10:25 AM  Frederick Rush  has presented today for surgery, with the diagnosis of Left knee osteoarthritis.  The various methods of treatment have been discussed with the patient and family. After consideration of risks, benefits and other options for treatment, the patient has consented to  Procedure(s) with comments: TOTAL KNEE ARTHROPLASTY (Left) - 43mn as a surgical intervention.  The patient's history has been reviewed, patient examined, no change in status, stable for surgery.  I have reviewed the patient's chart and labs.  Questions were answered to the patient's satisfaction.     FPilar PlateAluisio

## 2018-12-31 NOTE — Anesthesia Procedure Notes (Signed)
Spinal  Start time: 12/31/2018 12:31 PM End time: 12/31/2018 12:33 PM Staffing Anesthesiologist: Effie Berkshire, MD Performed: anesthesiologist  Preanesthetic Checklist Completed: patient identified, site marked, surgical consent, pre-op evaluation, timeout performed, IV checked, risks and benefits discussed and monitors and equipment checked Spinal Block Patient position: sitting Prep: site prepped and draped and DuraPrep Location: L3-4 Injection technique: single-shot Needle Needle type: Pencan  Needle gauge: 24 G Needle length: 10 cm Needle insertion depth: 10 cm Additional Notes Patient tolerated well. No immediate complications.

## 2018-12-31 NOTE — Evaluation (Signed)
Physical Therapy Evaluation Patient Details Name: Frederick Rush MRN: [id number] DOB: [birth year] Today's Date: 12/31/2018   History of Present Illness  s/p L TKA. PMH: HTN  Clinical Impression  Pt is s/p TKA resulting in the deficits listed below (see PT Problem List).  Pt amb ~ 77' with RW and min assist. Anticipate steady progress. Pt is quite motivated   Pt will benefit from skilled PT to increase their independence and safety with mobility to allow discharge to the venue listed below.      Follow Up Recommendations Follow surgeon's recommendation for DC plan and follow-up therapies    Equipment Recommendations  Other (comment)(TBD--?RW)    Recommendations for Other Services       Precautions / Restrictions Precautions Precautions: Fall;Knee Required Braces or Orthoses: Knee Immobilizer - Left Restrictions Weight Bearing Restrictions: No Other Position/Activity Restrictions: WBAT      Mobility  Bed Mobility Overal bed mobility: Needs Assistance Bed Mobility: Supine to Sit     Supine to sit: Min assist     General bed mobility comments: assist with LLE  Transfers Overall transfer level: Needs assistance Equipment used: Rolling walker (2 wheeled) Transfers: Sit to/from Stand Sit to Stand: Min assist         General transfer comment: cues for hand placement and LL E position  Ambulation/Gait Ambulation/Gait assistance: Min guard;Min assist Gait Distance (Feet): 55 Feet Assistive device: Rolling walker (2 wheeled) Gait Pattern/deviations: Step-to pattern;Decreased stance time - left     General Gait Details: cues for sequence and RW position  Stairs            Wheelchair Mobility    Modified Rankin (Stroke Patients Only)       Balance                                             Pertinent Vitals/Pain Pain Assessment: 0-10 Pain Score: 4  Pain Location: left knee Pain Descriptors / Indicators:  Aching;Grimacing;Sore Pain Intervention(s): Monitored during session;Limited activity within patient's tolerance;Repositioned;Premedicated before session    [place] expects to be discharged to:: Private residence Living Arrangements: Spouse/significant other Available Help at Discharge: Family Type of Home: House       Home Layout: One level        Prior Function Level of Independence: Independent               Hand Dominance        Extremity/Trunk Assessment   Upper Extremity Assessment Upper Extremity Assessment: Overall WFL for tasks assessed    Lower Extremity Assessment Lower Extremity Assessment: LLE deficits/detail LLE Deficits / Details: ankle WFL, knee extension and hip flexion 2+ to 3/5; AAROM grossly 10 to 55 degrees flexion       Communication   Communication: No difficulties  Cognition Arousal/Alertness: Awake/alert Behavior During Therapy: WFL for tasks assessed/performed Overall Cognitive Status: Within Functional Limits for tasks assessed                                        General Comments      Exercises Total Joint Exercises Ankle Circles/Pumps: AROM;Both;10 reps;Limitations Ankle Circles/Pumps Limitations: causes cramping per pt, advised to defer if having spasms   Assessment/Plan    PT Assessment Patient needs continued  PT services  PT Problem List Decreased strength;Decreased mobility;Decreased range of motion;Decreased activity tolerance;Pain;Decreased knowledge of use of DME       PT Treatment Interventions DME instruction;Therapeutic exercise;Gait training;Functional mobility training;Therapeutic activities;Patient/family education;Stair training    PT Goals (Current goals can be found in the Care Plan section)  Acute Rehab PT Goals Patient Stated Goal: be able to walk his dog PT Goal Formulation: With patient Time For Goal Achievement: 01/07/19 Potential to Achieve Goals: Good     Frequency 7X/week   Barriers to discharge        Co-evaluation               AM-PAC PT "6 Clicks" Mobility  Outcome Measure Help needed turning from your back to your side while in a flat bed without using bedrails?: A Little Help needed moving from lying on your back to sitting on the side of a flat bed without using bedrails?: A Little Help needed moving to and from a bed to a chair (including a wheelchair)?: A Little Help needed standing up from a chair using your arms (e.g., wheelchair or bedside chair)?: A Little Help needed to walk in hospital room?: A Little Help needed climbing 3-5 steps with a railing? : A Lot 6 Click Score: 17    End of Session Equipment Utilized During Treatment: Gait belt Activity Tolerance: Patient tolerated treatment well Patient left: in chair;with call bell/phone within reach;with chair alarm set   PT Visit Diagnosis: Difficulty in walking, not elsewhere classified (R26.2)    Time: 5498-2641 PT Time Calculation (min) (ACUTE ONLY): 29 min   Charges:   PT Evaluation $PT Eval Low Complexity: 1 Low PT Treatments $Gait Training: 8-22 mins        [name], PT  Pager: [phone] Acute Rehab Dept [facility]): [phone]   12/31/2018   [facility] 12/31/2018, 6:53 PM

## 2018-12-31 NOTE — Progress Notes (Signed)
Assisted Dr. Hollis with left, ultrasound guided, adductor canal block. Side rails up, monitors on throughout procedure. See vital signs in flow sheet. Tolerated Procedure well.  

## 2018-12-31 NOTE — Anesthesia Procedure Notes (Signed)
Procedure Name: LMA Insertion Date/Time: 12/31/2018 1:00 PM Performed by: Raenette Rover, CRNA Pre-anesthesia Checklist: Patient identified, Emergency Drugs available, Suction available and Patient being monitored Patient Re-evaluated:Patient Re-evaluated prior to induction Oxygen Delivery Method: Circle system utilized Preoxygenation: Pre-oxygenation with 100% oxygen Induction Type: IV induction LMA: LMA inserted LMA Size: 4.0 Number of attempts: 1 Placement Confirmation: positive ETCO2 and breath sounds checked- equal and bilateral Tube secured with: Tape Dental Injury: Teeth and Oropharynx as per pre-operative assessment

## 2018-12-31 NOTE — Transfer of Care (Signed)
Immediate Anesthesia Transfer of Care Note  Patient: Frederick Rush  Procedure(s) Performed: TOTAL KNEE ARTHROPLASTY (Left Knee)  Patient Location: PACU  Anesthesia Type:GA combined with regional for post-op pain  Level of Consciousness: drowsy and patient cooperative  Airway & Oxygen Therapy: Patient Spontanous Breathing and Patient connected to face mask oxygen  Post-op Assessment: Report given to RN and Post -op Vital signs reviewed and stable  Post vital signs: Reviewed and stable  Last Vitals:  Vitals Value Taken Time  BP    Temp    Pulse 57 12/31/18 1424  Resp 18 12/31/18 1424  SpO2 100 % 12/31/18 1424  Vitals shown include unvalidated device data.  Last Pain:  Vitals:   12/31/18 1208  TempSrc:   PainSc: 0-No pain      Patients Stated Pain Goal: 4 (04/15/34 1224)  Complications: No apparent anesthesia complications

## 2019-01-01 ENCOUNTER — Encounter (HOSPITAL_COMMUNITY): Payer: Self-pay | Admitting: Orthopedic Surgery

## 2019-01-01 LAB — BASIC METABOLIC PANEL
Anion gap: 7 (ref 5–15)
BUN: 15 mg/dL (ref 8–23)
CO2: 26 mmol/L (ref 22–32)
Calcium: 8.3 mg/dL — ABNORMAL LOW (ref 8.9–10.3)
Chloride: 103 mmol/L (ref 98–111)
Creatinine, Ser: 0.83 mg/dL (ref 0.61–1.24)
GFR calc Af Amer: >60 mL/min (ref >60)
GFR calc non Af Amer: >60 mL/min (ref >60)
Glucose, Bld: 138 mg/dL — ABNORMAL HIGH (ref 70–99)
Potassium: 4.4 mmol/L (ref 3.5–5.1)
Sodium: 136 mmol/L (ref 135–145)

## 2019-01-01 LAB — CBC
HCT: 33.5 % — ABNORMAL LOW (ref 39.0–52.0)
Hemoglobin: 10.8 g/dL — ABNORMAL LOW (ref 13.0–17.0)
MCH: 30.7 pg (ref 26.0–34.0)
MCHC: 32.2 g/dL (ref 30.0–36.0)
MCV: 95.2 fL (ref 80.0–100.0)
Platelets: 260 10*3/uL (ref 150–400)
RBC: 3.52 MIL/uL — ABNORMAL LOW (ref 4.22–5.81)
RDW: 13.2 % (ref 11.5–15.5)
WBC: 11 10*3/uL — ABNORMAL HIGH (ref 4.0–10.5)
nRBC: 0 % (ref 0.0–0.2)

## 2019-01-01 MED ORDER — METHOCARBAMOL 500 MG PO TABS: 500.0000 mg | ORAL_TABLET | Freq: Four times a day (QID) | ORAL | 0 refills | Status: DC | PRN

## 2019-01-01 MED ORDER — TRAMADOL HCL 50 MG PO TABS: 50.0000 mg | ORAL_TABLET | Freq: Four times a day (QID) | ORAL | 0 refills | Status: DC | PRN

## 2019-01-01 MED ORDER — OXYCODONE HCL 5 MG PO TABS: 5.0000 mg | ORAL_TABLET | Freq: Four times a day (QID) | ORAL | 0 refills | Status: DC | PRN

## 2019-01-01 MED ORDER — ASPIRIN 325 MG PO TBEC: 325.0000 mg | DELAYED_RELEASE_TABLET | Freq: Two times a day (BID) | ORAL | 0 refills | Status: AC

## 2019-01-01 MED ORDER — GABAPENTIN 300 MG PO CAPS: 300.0000 mg | ORAL_CAPSULE | Freq: Three times a day (TID) | ORAL | 0 refills | Status: DC

## 2019-01-01 NOTE — Progress Notes (Signed)
Physical Therapy Treatment Patient Details Name: Frederick Rush MRN: [id number] DOB: 06-19-1950 Today's Date: 01/01/2019    History of Present Illness s/p L TKA. PMH: HTN    PT Comments    Pt progressing well. Reviewed HEP. Ready for d/c from PT standpoint  Follow Up Recommendations  Follow surgeon's recommendation for DC plan and follow-up therapies     Equipment Recommendations  Other (comment)    Recommendations for Other Services       Precautions / Restrictions Precautions Precautions: Fall;Knee Precaution Comments: independent SLRs today Required Braces or Orthoses: Knee Immobilizer - Left Knee Immobilizer - Left: Discontinue once straight leg raise with < 10 degree lag Restrictions Weight Bearing Restrictions: No Other Position/Activity Restrictions: WBAT    Mobility  Bed Mobility               General bed mobility comments: in recliner  Transfers                    Ambulation/Gait                 Stairs             Wheelchair Mobility    Modified Rankin (Stroke Patients Only)       Balance                                            Cognition Arousal/Alertness: Awake/alert Behavior During Therapy: WFL for tasks assessed/performed Overall Cognitive Status: Within Functional Limits for tasks assessed                                        Exercises Total Joint Exercises Ankle Circles/Pumps: AROM;Both;10 reps Quad Sets: AROM;10 reps;Both Heel Slides: AROM;AAROM;Left;10 reps Hip ABduction/ADduction: AAROM;Left;10 reps Straight Leg Raises: AROM;Left;10 reps Long Arc Quad: AROM;Left;10 reps Knee Flexion: AROM;Left;Seated Goniometric ROM: grossly 6 to 75 degrees AAROM     General Comments        Pertinent Vitals/Pain Pain Assessment: 0-10 Pain Score: 5 (with activity) Pain Location: left knee Pain Descriptors / Indicators: Aching;Grimacing;Sore Pain Intervention(s):  Limited activity within patient's tolerance;Monitored during session;Premedicated before session;Repositioned;Ice applied    Home Living                      Prior Function            PT Goals (current goals can now be found in the care plan section) Acute Rehab PT Goals Patient Stated Goal: be able to walk his dog PT Goal Formulation: With patient Time For Goal Achievement: 01/07/19 Potential to Achieve Goals: Good Progress towards PT goals: Progressing toward goals    Frequency    7X/week      PT Plan Current plan remains appropriate    Co-evaluation              AM-PAC PT "6 Clicks" Mobility   Outcome Measure  Help needed turning from your back to your side while in a flat bed without using bedrails?: A Little Help needed moving from lying on your back to sitting on the side of a flat bed without using bedrails?: A Little Help needed moving to and from a bed to a chair (including a wheelchair)?: A Little Help needed standing  up from a chair using your arms (e.g., wheelchair or bedside chair)?: A Little Help needed to walk in hospital room?: A Little Help needed climbing 3-5 steps with a railing? : A Little 6 Click Score: 18    End of Session Equipment Utilized During Treatment: Gait belt Activity Tolerance: Patient tolerated treatment well Patient left: in chair;with call bell/phone within reach;with chair alarm set   PT Visit Diagnosis: Difficulty in walking, not elsewhere classified (R26.2)     Time: 9604-5409 PT Time Calculation (min) (ACUTE ONLY): 17 min  Charges:  $Therapeutic Exercise: 8-22 mins                     [name], PT  Pager: [phone] Acute Rehab Dept [facility]): [phone]   01/01/2019    [facility] 01/01/2019, 2:56 PM

## 2019-01-01 NOTE — TOC Transition Note (Signed)
Transition of Care [facility]) - CM/SW Discharge Note   Patient Details  Name: CORDARREL STIEFEL MRN: [id number] Date of Birth: [birth year]  Transition of Care [facility]) CM/SW Contact:  Lia Hopping, LCSW Phone Number: 01/01/2019, 10:20 AM   Clinical Narrative:    Therapy Plan: OPPT Has RW, 3 IN 1 Ordered through [facility]   Final next level of care: OP Rehab Barriers to Discharge: No Barriers Identified   Patient Goals and CMS Choice     Choice offered to / list presented to : NA  Discharge Placement                       Discharge Plan and Services                DME Arranged: 3-N-1 DME Agency: [employer] Date DME Agency Contacted: 01/01/19 Time DME Agency Contacted: 0900 Representative spoke with at DME Agency: [place] ([place]) Interventions     Readmission Risk Interventions No flowsheet data found.

## 2019-01-01 NOTE — Progress Notes (Signed)
   Subjective: 1 Day Post-Op Procedure(s) (LRB): TOTAL KNEE ARTHROPLASTY (Left) Patient reports pain as mild.   Patient seen in rounds by Dr. Wynelle Link. Patient is well, and has had no acute complaints or problems other than discomfort in the left knee. No acute events overnight. Patient ambulated 55 feet with PT yesterday. Foley catheter removed, positive flatus. Denies CP, SHOB.  We will continue therapy today.   Objective: Vital signs in last 24 hours: Temp:  [97.4 F (36.3 C)-98.2 F (36.8 C)] 98.1 F (36.7 C) (11/10 0544) Pulse Rate:  [50-75] 75 (11/10 0544) Resp:  [10-25] 16 (11/10 0544) BP: (123-163)/(66-92) 127/66 (11/10 0544) SpO2:  [98 %-100 %] 100 % (11/10 0544) Weight:  [83.4 kg] 83.4 kg (11/09 1028)  Intake/Output from previous day:  Intake/Output Summary (Last 24 hours) at 01/01/2019 0736 Last data filed at 01/01/2019 0600 Gross per 24 hour  Intake 4040.8 ml  Output 2300 ml  Net 1740.8 ml     Intake/Output this shift: No intake/output data recorded.  Labs: Recent Labs    01/01/19 0227  HGB 10.8*   Recent Labs    01/01/19 0227  WBC 11.0*  RBC 3.52*  HCT 33.5*  PLT 260   Recent Labs    01/01/19 0227  NA 136  K 4.4  CL 103  CO2 26  BUN 15  CREATININE 0.83  GLUCOSE 138*  CALCIUM 8.3*   No results for input(s): LABPT, INR in the last 72 hours.  Exam: General - Patient is Alert and Oriented Extremity - Neurologically intact Sensation intact distally Intact pulses distally Dorsiflexion/Plantar flexion intact Dressing - dressing C/D/I Motor Function - intact, moving foot and toes well on exam.   Past Medical History:  Diagnosis Date  . Arthritis   . CAD (coronary artery disease), non obstructive by cath 07/19/11, treat medically 07/19/2011  . Crohn disease ([place])   . Hypercholesterolemia   . Hyperlipidemia   . Myocardial infarction ([place])   . Skin cancer 2006   Left shoulder; followed every six months.      Assessment/Plan: 1 Day  Post-Op Procedure(s) (LRB): TOTAL KNEE ARTHROPLASTY (Left) Principal Problem:   OA (osteoarthritis) of knee  Estimated body mass index is 27.95 kg/m as calculated from the following:   Height as of this encounter: 5' 8"  (1.727 m).   Weight as of this encounter: 83.4 kg. Advance diet Up with therapy D/C IV fluids   Patient's anticipated LOS is less than 2 midnights, meeting these requirements: - Lives within 1 hour of care - Has a competent adult at home to recover with post-op recover - NO history of  - Chronic pain requiring opiods  - Diabetes  - Coronary Artery Disease  - Heart failure  - Heart attack  - Stroke  - DVT/VTE  - Cardiac arrhythmia  - Respiratory Failure/COPD  - Renal failure  - Anemia  - Advanced Liver disease  DVT Prophylaxis - Aspirin Weight bearing as tolerated. D/C O2 and pulse ox and try on room air. Hemovac pulled without difficulty, will begin therapy today.  Plan is to go Home after hospital stay. Plan for discharge today after 1-2 sessions of therapy as long as he continues to meet goals with therapy. Scheduled for OPPT. Follow up in the office in 2 weeks.   Griffith Citron, PA-C Orthopedic Surgery [phone] 01/01/2019, 7:36 AM

## 2019-01-01 NOTE — Anesthesia Postprocedure Evaluation (Signed)
Anesthesia Post Note  Patient: Frederick Rush  Procedure(s) Performed: TOTAL KNEE ARTHROPLASTY (Left Knee)     Patient location during evaluation: PACU Anesthesia Type: General Level of consciousness: awake and alert Pain management: pain level controlled Vital Signs Assessment: post-procedure vital signs reviewed and stable Respiratory status: spontaneous breathing, nonlabored ventilation, respiratory function stable and patient connected to nasal cannula oxygen Cardiovascular status: blood pressure returned to baseline and stable Postop Assessment: no apparent nausea or vomiting Anesthetic complications: no    Last Vitals:  Vitals:   01/01/19 0043 01/01/19 0544  BP: 124/80 127/66  Pulse: 64 75  Resp: 16 16  Temp: 36.5 C 36.7 C  SpO2: 100% 100%                  Effie Berkshire

## 2019-01-01 NOTE — Progress Notes (Signed)
Physical Therapy Treatment Patient Details Name: Frederick Rush MRN: [id number] DOB: [birth year] Today's Date: 01/01/2019    History of Present Illness s/p L TKA. PMH: HTN    PT Comments    Pt progressing well. Will see again in pm, pt should be ready to d/c after pm session  Follow Up Recommendations  Follow surgeon's recommendation for DC plan and follow-up therapies     Equipment Recommendations  Other (comment)    Recommendations for Other Services       Precautions / Restrictions Precautions Precautions: Fall;Knee Precaution Comments: independent SLRs today Required Braces or Orthoses: Knee Immobilizer - Left Knee Immobilizer - Left: Discontinue once straight leg raise with < 10 degree lag Restrictions Weight Bearing Restrictions: No Other Position/Activity Restrictions: WBAT    Mobility  Bed Mobility Overal bed mobility: Needs Assistance Bed Mobility: Supine to Sit     Supine to sit: Supervision     General bed mobility comments: for safety  Transfers Overall transfer level: Needs assistance Equipment used: Rolling walker (2 wheeled) Transfers: Sit to/from Stand Sit to Stand: Min guard;Supervision         General transfer comment: cues for hand placement and LL E position  Ambulation/Gait Ambulation/Gait assistance: Supervision;Min guard Gait Distance (Feet): 120 Feet Assistive device: Rolling walker (2 wheeled) Gait Pattern/deviations: Step-to pattern;Decreased stance time - left     General Gait Details: cues for sequence and RW position   Stairs Stairs: Yes Stairs assistance: Min guard;Min assist Stair Management: One rail Right;One rail Left;Sideways;Step to pattern Number of Stairs: 5(x2) General stair comments: cues for sequence and safe technique   Wheelchair Mobility    Modified Rankin (Stroke Patients Only)       Balance                                            Cognition Arousal/Alertness:  Awake/alert Behavior During Therapy: WFL for tasks assessed/performed Overall Cognitive Status: Within Functional Limits for tasks assessed                                        Exercises Total Joint Exercises Ankle Circles/Pumps: AROM;Both;10 reps Quad Sets: AROM;10 reps;Both Straight Leg Raises: AROM;Left;10 reps    General Comments        Pertinent Vitals/Pain Pain Assessment: 0-10 Pain Location: left knee Pain Descriptors / Indicators: Aching;Grimacing;Sore Pain Intervention(s): Limited activity within patient's tolerance;Monitored during session;Premedicated before session;Repositioned    Home Living                      Prior Function            PT Goals (current goals can now be found in the care plan section) Acute Rehab PT Goals Patient Stated Goal: be able to walk his dog PT Goal Formulation: With patient Time For Goal Achievement: 01/07/19 Potential to Achieve Goals: Good Progress towards PT goals: Progressing toward goals    Frequency    7X/week      PT Plan Current plan remains appropriate    Co-evaluation              AM-PAC PT "6 Clicks" Mobility   Outcome Measure  Help needed turning from your back to your side while in a flat bed without  using bedrails?: A Little Help needed moving from lying on your back to sitting on the side of a flat bed without using bedrails?: A Little Help needed moving to and from a bed to a chair (including a wheelchair)?: A Little Help needed standing up from a chair using your arms (e.g., wheelchair or bedside chair)?: A Little Help needed to walk in hospital room?: A Little Help needed climbing 3-5 steps with a railing? : A Little 6 Click Score: 18    End of Session Equipment Utilized During Treatment: Gait belt Activity Tolerance: Patient tolerated treatment well Patient left: in chair;with call bell/phone within reach;with chair alarm set   PT Visit Diagnosis: Difficulty in  walking, not elsewhere classified (R26.2)     Time: 1017-1040 PT Time Calculation (min) (ACUTE ONLY): 23 min  Charges:  $Gait Training: 23-37 mins                     Frederick Rush, PT  Pager: [phone] Acute Rehab Dept [facility]): [phone]   01/01/2019    [facility] 01/01/2019, 11:01 AM

## 2019-01-07 NOTE — Discharge Summary (Signed)
Physician Discharge Summary   Patient ID: Frederick Rush MRN: [id number] DOB/AGE: 1950-09-16 68 y.o.  Admit date: 12/31/2018 Discharge date: 01/01/2019  Primary Diagnosis: Osteoarthritis  Left knee(s)  Admission Diagnoses:  Past Medical History:  Diagnosis Date   Arthritis    CAD (coronary artery disease), non obstructive by cath 07/19/11, treat medically 07/19/2011   Crohn disease ([place])    Hypercholesterolemia    Hyperlipidemia    Myocardial infarction [facility])    Skin cancer 2006   Left shoulder; followed every six months.     Discharge Diagnoses:   Principal Problem:   OA (osteoarthritis) of knee  Estimated body mass index is 27.95 kg/m as calculated from the following:   Height as of this encounter: 5' 8"  (1.727 m).   Weight as of this encounter: 83.4 kg.  Procedure:  Procedure(s) (LRB): TOTAL KNEE ARTHROPLASTY (Left)   Consults: None  HPI: Frederick Rush is a 68 y.o. year old male with end stage OA of his left knee with progressively worsening pain and dysfunction. He has constant pain, with activity and at rest and significant functional deficits with difficulties even with ADLs. He has had extensive non-op management including analgesics, injections of cortisone and viscosupplements, and home exercise program, but remains in significant pain with significant dysfunction. Radiographs show bone on bone arthritis medial and patellofemoral. He presents now for left Total Knee Arthroplasty.   Laboratory Data: Admission on 12/31/2018, Discharged on 01/01/2019  Component Date Value Ref Range Status   WBC 01/01/2019 11.0* 4.0 - 10.5 K/uL Final   RBC 01/01/2019 3.52* 4.22 - 5.81 MIL/uL Final   Hemoglobin 01/01/2019 10.8* 13.0 - 17.0 g/dL Final   HCT 01/01/2019 33.5* 39.0 - 52.0 % Final   MCV 01/01/2019 95.2  80.0 - 100.0 fL Final   MCH 01/01/2019 30.7  26.0 - 34.0 pg Final   MCHC 01/01/2019 32.2  30.0 - 36.0 g/dL Final   RDW 01/01/2019 13.2  11.5 - 15.5 %  Final   Platelets 01/01/2019 260  150 - 400 K/uL Final   nRBC 01/01/2019 0.0  0.0 - 0.2 % Final   Performed at [facility], [place] [address] [place], [place] [address]   Sodium 01/01/2019 136  135 - 145 mmol/L Final   Potassium 01/01/2019 4.4  3.5 - 5.1 mmol/L Final   Chloride 01/01/2019 103  98 - 111 mmol/L Final   CO2 01/01/2019 26  22 - 32 mmol/L Final   Glucose, Bld 01/01/2019 138* 70 - 99 mg/dL Final   BUN 01/01/2019 15  8 - 23 mg/dL Final   Creatinine, Ser 01/01/2019 0.83  0.61 - 1.24 mg/dL Final   Calcium 01/01/2019 8.3* 8.9 - 10.3 mg/dL Final   GFR calc non Af Amer 01/01/2019 >60  >60 mL/min Final   GFR calc Af Amer 01/01/2019 >60  >60 mL/min Final   Anion gap 01/01/2019 7  5 - 15 Final   Performed at [facility], [place] [address], [place], [place] [address]  Hospital Outpatient Visit on 12/27/2018  Component Date Value Ref Range Status   SARS-CoV-2, NAA 12/27/2018 NOT DETECTED  NOT DETECTED Final   Comment: (NOTE) This nucleic acid amplification test was developed and its performance characteristics determined by [employer]. Nucleic acid amplification tests include PCR and TMA. This test has not been FDA cleared or approved. This test has been authorized by FDA under an Emergency Use Authorization (EUA). This test is only authorized for the duration of time  the declaration that circumstances exist justifying the authorization of the emergency use of in vitro diagnostic tests for detection of SARS-CoV-2 virus and/or diagnosis of COVID-19 infection under section 564(b)(1) of the Act, 21 U.S.C. 407WKG-8(U) (1), unless the authorization is terminated or revoked sooner. When diagnostic testing is negative, the possibility of a false negative result should be considered in the context of a patient's recent exposures and the presence of clinical signs and symptoms consistent with COVID-19. An individual without symptoms  of COVID- 19 and who is not shedding SARS-CoV-2 vi                          rus would expect to have a negative (not detected) result in this assay. Performed At: [facility] [place], [place] [id number] Rush Farmer MD OP:[id number]    Coronavirus Source 12/27/2018 NASOPHARYNGEAL   Final   Performed at [facility], [place] [address], [place], [place] [address]  Hospital Outpatient Visit on 12/24/2018  Component Date Value Ref Range Status   aPTT 12/24/2018 26  24 - 36 seconds Final   Performed at [facility], [place] [address] [place], [place] [address]   WBC 12/24/2018 8.7  4.0 - 10.5 K/uL Final   RBC 12/24/2018 4.27  4.22 - 5.81 MIL/uL Final   Hemoglobin 12/24/2018 13.3  13.0 - 17.0 g/dL Final   HCT 12/24/2018 40.0  39.0 - 52.0 % Final   MCV 12/24/2018 93.7  80.0 - 100.0 fL Final   MCH 12/24/2018 31.1  26.0 - 34.0 pg Final   MCHC 12/24/2018 33.3  30.0 - 36.0 g/dL Final   RDW 12/24/2018 13.3  11.5 - 15.5 % Final   Platelets 12/24/2018 297  150 - 400 K/uL Final   nRBC 12/24/2018 0.0  0.0 - 0.2 % Final   Neutrophils Relative % 12/24/2018 62  % Final   Neutro Abs 12/24/2018 5.4  1.7 - 7.7 K/uL Final   Lymphocytes Relative 12/24/2018 22  % Final   Lymphs Abs 12/24/2018 1.9  0.7 - 4.0 K/uL Final   Monocytes Relative 12/24/2018 12  % Final   Monocytes Absolute 12/24/2018 1.1* 0.1 - 1.0 K/uL Final   Eosinophils Relative 12/24/2018 2  % Final   Eosinophils Absolute 12/24/2018 0.2  0.0 - 0.5 K/uL Final   Basophils Relative 12/24/2018 1  % Final   Basophils Absolute 12/24/2018 0.1  0.0 - 0.1 K/uL Final   Immature Granulocytes 12/24/2018 1  % Final   Abs Immature Granulocytes 12/24/2018 0.04  0.00 - 0.07 K/uL Final   Performed at [facility], [place] [address], [place], [place] [address]   Sodium 12/24/2018 138  135 - 145 mmol/L Final   Potassium 12/24/2018 3.8  3.5 - 5.1 mmol/L Final   Chloride  12/24/2018 103  98 - 111 mmol/L Final   CO2 12/24/2018 27  22 - 32 mmol/L Final   Glucose, Bld 12/24/2018 95  70 - 99 mg/dL Final   BUN 12/24/2018 12  8 - 23 mg/dL Final   Creatinine, Ser 12/24/2018 0.90  0.61 - 1.24 mg/dL Final   Calcium 12/24/2018 9.4  8.9 - 10.3 mg/dL Final   Total Protein 12/24/2018 7.2  6.5 - 8.1 g/dL Final   Albumin 12/24/2018 4.5  3.5 - 5.0 g/dL Final   AST 12/24/2018 41  15 - 41 U/L Final   ALT 12/24/2018 82* 0 - 44 U/L Final  Alkaline Phosphatase 12/24/2018 35* 38 - 126 U/L Final   Total Bilirubin 12/24/2018 0.8  0.3 - 1.2 mg/dL Final   GFR calc non Af Amer 12/24/2018 >60  >60 mL/min Final   GFR calc Af Amer 12/24/2018 >60  >60 mL/min Final   Anion gap 12/24/2018 8  5 - 15 Final   Performed at [facility], [place] [address], [place], [place] [address]   Prothrombin Time 12/24/2018 12.6  11.4 - 15.2 seconds Final   INR 12/24/2018 1.0  0.8 - 1.2 Final   Comment: (NOTE) INR goal varies based on device and disease states. Performed at [facility], [place] [address] [place], [place] [address]    ABO/RH(D) 12/24/2018 A POS   Final   Antibody Screen 12/24/2018 NEG   Final   Sample Expiration 12/24/2018 01/03/2019,2359   Final   Extend sample reason 12/24/2018    Final                   Value:NO TRANSFUSIONS OR PREGNANCY IN THE PAST 3 MONTHS Performed at [place] [address], [place], [place] [address]    MRSA, PCR 12/24/2018 NEGATIVE  NEGATIVE Final   Staphylococcus aureus 12/24/2018 NEGATIVE  NEGATIVE Final   Comment: (NOTE) The Xpert SA Assay (FDA approved for NASAL specimens in patients 78 years of age and older), is one component of a comprehensive surveillance program. It is not intended to diagnose infection nor to guide or monitor treatment. Performed at [facility], [place] [address] [place], [place] [address]    ABO/RH(D) 12/24/2018    Final                    Value:A POS Performed at [facility], [place] [address], [place], [place] [address]   Office Visit on 12/12/2018  Component Date Value Ref Range Status   Hgb A1c MFr Bld 12/12/2018 5.7* 4.8 - 5.6 % Final   Comment:          Prediabetes: 5.7 - 6.4          Diabetes: >6.4          Glycemic control for adults with diabetes: <7.0    Est. average glucose Bld gHb Est-m* 12/12/2018 117  mg/dL Final   INR 12/12/2018 0.9  0.9 - 1.2 Final   Comment: Reference interval is for non-anticoagulated patients. Suggested INR therapeutic range for Vitamin K antagonist therapy:    Standard Dose (moderate intensity                   therapeutic range):       2.0 - 3.0    Higher intensity therapeutic range       2.5 - 3.5 **Please note reference interval change**    Prothrombin Time 12/12/2018 10.0  9.1 - 12.0 sec Final                 **Please note reference interval change**   Glucose 12/12/2018 72  65 - 99 mg/dL Final   BUN 12/12/2018 10  8 - 27 mg/dL Final   Creatinine, Ser 12/12/2018 0.92  0.76 - 1.27 mg/dL Final   GFR calc non Af Amer 12/12/2018 85  >59 mL/min/1.73 Final   GFR calc Af Amer 12/12/2018 98  >59 mL/min/1.73 Final   BUN/Creatinine Ratio 12/12/2018 11  10 - 24 Final   Sodium 12/12/2018 134  134 - 144 mmol/L Final  Potassium 12/12/2018 4.1  3.5 - 5.2 mmol/L Final   Chloride 12/12/2018 96  96 - 106 mmol/L Final   CO2 12/12/2018 25  20 - 29 mmol/L Final   Calcium 12/12/2018 9.6  8.6 - 10.2 mg/dL Final   Total Protein 12/12/2018 6.4  6.0 - 8.5 g/dL Final   Albumin 12/12/2018 4.6  3.8 - 4.8 g/dL Final   Globulin, Total 12/12/2018 1.8  1.5 - 4.5 g/dL Final   Albumin/Globulin Ratio 12/12/2018 2.6* 1.2 - 2.2 Final   Bilirubin Total 12/12/2018 0.3  0.0 - 1.2 mg/dL Final   Alkaline Phosphatase 12/12/2018 36* 39 - 117 IU/L Final   AST 12/12/2018 45* 0 - 40 IU/L Final   ALT 12/12/2018 90* 0 - 44 IU/L Final   WBC 12/12/2018 9.8  3.4 - 10.8  x10E3/uL Final   RBC 12/12/2018 4.17  4.14 - 5.80 x10E6/uL Final   Hemoglobin 12/12/2018 13.2  13.0 - 17.7 g/dL Final   Hematocrit 12/12/2018 38.5  37.5 - 51.0 % Final   MCV 12/12/2018 92  79 - 97 fL Final   MCH 12/12/2018 31.7  26.6 - 33.0 pg Final   MCHC 12/12/2018 34.3  31.5 - 35.7 g/dL Final   RDW 12/12/2018 13.2  11.6 - 15.4 % Final   Platelets 12/12/2018 357  150 - 450 x10E3/uL Final   Color, UA 12/12/2018 yellow  yellow Final   Clarity, UA 12/12/2018 clear  clear Final   Glucose, UA 12/12/2018 negative  negative mg/dL Final   Bilirubin, UA 12/12/2018 negative  negative Final   Ketones, POC UA 12/12/2018 negative  negative mg/dL Final   Spec Grav, UA 12/12/2018 1.010  1.010 - 1.025 Final   Blood, UA 12/12/2018 negative  negative Final   pH, UA 12/12/2018 6.0  5.0 - 8.0 Final   Protein Ur, POC 12/12/2018 negative  negative mg/dL Final   Urobilinogen, UA 12/12/2018 0.2  0.2 or 1.0 E.U./dL Final   Nitrite, UA 12/12/2018 Negative  Negative Final   Leukocytes, UA 12/12/2018 Negative  Negative Final     X-Rays:No results found.  EKG: Orders placed or performed during the hospital encounter of 12/24/18   EKG   EKG     Hospital Course: Frederick Rush is a 68 y.o. who was admitted to [facility]. They were brought to the operating room on 12/31/2018 and underwent Procedure(s): TOTAL KNEE ARTHROPLASTY.  Patient tolerated the procedure well and was later transferred to the recovery room and then to the orthopaedic floor for postoperative care. They were given PO and IV analgesics for pain control following their surgery. They were given 24 hours of postoperative antibiotics of  Anti-infectives (From admission, onward)   Start     Dose/Rate Route Frequency Ordered Stop   12/31/18 1800  ceFAZolin (ANCEF) IVPB 2g/100 mL premix     2 g 200 mL/hr over 30 Minutes Intravenous Every 6 hours 12/31/18 1611 01/01/19 0101   12/31/18 1015  ceFAZolin (ANCEF) IVPB  2g/100 mL premix     2 g 200 mL/hr over 30 Minutes Intravenous On call to O.R. 12/31/18 1009 12/31/18 1237     and started on DVT prophylaxis in the form of Aspirin.   PT and OT were ordered for total joint protocol. Discharge planning consulted to help with postop disposition and equipment needs.  Patient had a good night on the evening of surgery. They started to get up OOB with therapy on POD #0. Pt was seen during rounds and was  ready to go home pending progress with therapy. Hemovac drain was pulled without difficulty. He worked with therapy on POD #1 and was meeting his goals. Pt was discharged to home later that day in stable condition.  Diet: Regular diet Activity: WBAT Follow-up: in 2 weeks Disposition: Home Discharged Condition: good   Discharge Instructions    Call MD / Call 911   Complete by: As directed    If you experience chest pain or shortness of breath, CALL 911 and be transported to the hospital emergency room.  If you develope a fever above 101 F, pus (white drainage) or increased drainage or redness at the wound, or calf pain, call your surgeon's office.   Change dressing   Complete by: As directed    Change dressing on Wednesday, then change the dressing daily with sterile 4 x 4 inch gauze dressing and apply TED hose.   Constipation Prevention   Complete by: As directed    Drink plenty of fluids.  Prune juice may be helpful.  You may use a stool softener, such as Colace (over the counter) 100 mg twice a day.  Use MiraLax (over the counter) for constipation as needed.   Diet - low sodium heart healthy   Complete by: As directed    Discharge instructions   Complete by: As directed    Dr. Gaynelle Arabian Total Joint Specialist [facility] [address], [place], [place] [address] [phone]  TOTAL KNEE REPLACEMENT POSTOPERATIVE DIRECTIONS  Knee Rehabilitation, Guidelines Following Surgery  Results after knee surgery are often greatly improved when  you follow the exercise, range of motion and muscle strengthening exercises prescribed by your doctor. Safety measures are also important to protect the knee from further injury. Any time any of these exercises cause you to have increased pain or swelling in your knee joint, decrease the amount until you are comfortable again and slowly increase them. If you have problems or questions, call your caregiver or physical therapist for advice.   HOME CARE INSTRUCTIONS  Remove items at home which could result in a fall. This includes throw rugs or furniture in walking pathways.  ICE to the affected knee every three hours for 30 minutes at a time and then as needed for pain and swelling.  Continue to use ice on the knee for pain and swelling from surgery. You may notice swelling that will progress down to the foot and ankle.  This is normal after surgery.  Elevate the leg when you are not up walking on it.   Continue to use the breathing machine which will help keep your temperature down.  It is common for your temperature to cycle up and down following surgery, especially at night when you are not up moving around and exerting yourself.  The breathing machine keeps your lungs expanded and your temperature down. Do not place pillow under knee, focus on keeping the knee straight while resting   DIET You may resume your previous home diet once your are discharged from the hospital.  DRESSING / WOUND CARE / SHOWERING You may change your dressing 3-5 days after surgery.  Then change the dressing every day with sterile gauze.  Please use good hand washing techniques before changing the dressing.  Do not use any lotions or creams on the incision until instructed by your surgeon. You may start showering once you are discharged home but do not submerge the incision under water. Just pat the incision dry and apply a  dry gauze dressing on daily. Change the surgical dressing daily and reapply a dry dressing each  time.  ACTIVITY Walk with your walker as instructed. Use walker as long as suggested by your caregivers. Avoid periods of inactivity such as sitting longer than an hour when not asleep. This helps prevent blood clots.  You may resume a sexual relationship in one month or when given the OK by your doctor.  You may return to work once you are cleared by your doctor.  Do not drive a car for 6 weeks or until released by you surgeon.  Do not drive while taking narcotics.  WEIGHT BEARING Weight bearing as tolerated with assist device (walker, cane, etc) as directed, use it as long as suggested by your surgeon or therapist, typically at least 4-6 weeks.  POSTOPERATIVE CONSTIPATION PROTOCOL Constipation - defined medically as fewer than three stools per week and severe constipation as less than one stool per week.  One of the most common issues patients have following surgery is constipation.  Even if you have a regular bowel pattern at home, your normal regimen is likely to be disrupted due to multiple reasons following surgery.  Combination of anesthesia, postoperative narcotics, change in appetite and fluid intake all can affect your bowels.  In order to avoid complications following surgery, here are some recommendations in order to help you during your recovery period.  Colace (docusate) - Pick up an over-the-counter form of Colace or another stool softener and take twice a day as long as you are requiring postoperative pain medications.  Take with a full glass of water daily.  If you experience loose stools or diarrhea, hold the colace until you stool forms back up.  If your symptoms do not get better within 1 week or if they get worse, check with your doctor.  Dulcolax (bisacodyl) - Pick up over-the-counter and take as directed by the product packaging as needed to assist with the movement of your bowels.  Take with a full glass of water.  Use this product as needed if not relieved by Colace  only.   MiraLax (polyethylene glycol) - Pick up over-the-counter to have on hand.  MiraLax is a solution that will increase the amount of water in your bowels to assist with bowel movements.  Take as directed and can mix with a glass of water, juice, soda, coffee, or tea.  Take if you go more than two days without a movement. Do not use MiraLax more than once per day. Call your doctor if you are still constipated or irregular after using this medication for 7 days in a row.  If you continue to have problems with postoperative constipation, please contact the office for further assistance and recommendations.  If you experience "the worst abdominal pain ever" or develop nausea or vomiting, please contact the office immediatly for further recommendations for treatment.  ITCHING  If you experience itching with your medications, try taking only a single pain pill, or even half a pain pill at a time.  You can also use Benadryl over the counter for itching or also to help with sleep.   TED HOSE STOCKINGS Wear the elastic stockings on both legs for three weeks following surgery during the day but you may remove then at night for sleeping.  MEDICATIONS See your medication summary on the "After Visit Summary" that the nursing staff will review with you prior to discharge.  You may have some home medications which will be placed on  hold until you complete the course of blood thinner medication.  It is important for you to complete the blood thinner medication as prescribed by your surgeon.  Continue your approved medications as instructed at time of discharge.  PRECAUTIONS If you experience chest pain or shortness of breath - call 911 immediately for transfer to the hospital emergency department.  If you develop a fever greater that 101 F, purulent drainage from wound, increased redness or drainage from wound, foul odor from the wound/dressing, or calf pain - CONTACT YOUR SURGEON.                                                    FOLLOW-UP APPOINTMENTS Make sure you keep all of your appointments after your operation with your surgeon and caregivers. You should call the office at the above phone number and make an appointment for approximately two weeks after the date of your surgery or on the date instructed by your surgeon outlined in the "After Visit Summary".   RANGE OF MOTION AND STRENGTHENING EXERCISES  Rehabilitation of the knee is important following a knee injury or an operation. After just a few days of immobilization, the muscles of the thigh which control the knee become weakened and shrink (atrophy). Knee exercises are designed to build up the tone and strength of the thigh muscles and to improve knee motion. Often times heat used for twenty to thirty minutes before working out will loosen up your tissues and help with improving the range of motion but do not use heat for the first two weeks following surgery. These exercises can be done on a training (exercise) mat, on the floor, on a table or on a bed. Use what ever works the best and is most comfortable for you Knee exercises include:  Leg Lifts - While your knee is still immobilized in a splint or cast, you can do straight leg raises. Lift the leg to 60 degrees, hold for 3 sec, and slowly lower the leg. Repeat 10-20 times 2-3 times daily. Perform this exercise against resistance later as your knee gets better.  Quad and Hamstring Sets - Tighten up the muscle on the front of the thigh (Quad) and hold for 5-10 sec. Repeat this 10-20 times hourly. Hamstring sets are done by pushing the foot backward against an object and holding for 5-10 sec. Repeat as with quad sets.  Leg Slides: Lying on your back, slowly slide your foot toward your buttocks, bending your knee up off the floor (only go as far as is comfortable). Then slowly slide your foot back down until your leg is flat on the floor again. Angel Wings: Lying on your back spread your legs to  the side as far apart as you can without causing discomfort.  A rehabilitation program following serious knee injuries can speed recovery and prevent re-injury in the future due to weakened muscles. Contact your doctor or a physical therapist for more information on knee rehabilitation.   IF YOU ARE TRANSFERRED TO A SKILLED REHAB FACILITY If the patient is transferred to a skilled rehab facility following release from the hospital, a list of the current medications will be sent to the facility for the patient to continue.  When discharged from the skilled rehab facility, please have the facility set up the patient's [place] prior  to being released. Also, the skilled facility will be responsible for providing the patient with their medications at time of release from the facility to include their pain medication, the muscle relaxants, and their blood thinner medication. If the patient is still at the rehab facility at time of the two week follow up appointment, the skilled rehab facility will also need to assist the patient in arranging follow up appointment in our office and any transportation needs.  MAKE SURE YOU:  Understand these instructions.  Get help right away if you are not doing well or get worse.    Pick up stool softner and laxative for home use following surgery while on pain medications. Do not submerge incision under water. Please use good hand washing techniques while changing dressing each day. May shower starting three days after surgery. Please use a clean towel to pat the incision dry following showers. Continue to use ice for pain and swelling after surgery. Do not use any lotions or creams on the incision until instructed by your surgeon.   Do not put a pillow under the knee. Place it under the heel.   Complete by: As directed    Driving restrictions   Complete by: As directed    No driving for two weeks   TED hose   Complete by: As directed    Use  stockings (TED hose) for three weeks on both leg(s).  You may remove them at night for sleeping.   Weight bearing as tolerated   Complete by: As directed      Allergies as of 01/01/2019      Reactions   Latex    Pt stated that latex causes itching.      Medication List    STOP taking these medications   aspirin 81 MG tablet Replaced by: aspirin 325 MG EC tablet   meloxicam 15 MG tablet Commonly known as: MOBIC     TAKE these medications   amLODipine 5 MG tablet Commonly known as: NORVASC TAKE 1 TABLET(5 MG) BY MOUTH DAILY What changed: See the new instructions.   aspirin 325 MG EC tablet Take 1 tablet (325 mg total) by mouth 2 (two) times daily for 20 days. Take one tablet (325 mg) Aspirin two times a day for three weeks following surgery.Then resume one baby Aspirin (81 mg) daily. Replaces: aspirin 81 MG tablet   fluticasone 50 MCG/ACT nasal spray Commonly known as: FLONASE Place 1 spray into both nostrils daily.   gabapentin 300 MG capsule Commonly known as: NEURONTIN Take 1 capsule (300 mg total) by mouth 3 (three) times daily. Take a 300 mg capsule three times a day for two weeks following surgery.Then take a 300 mg capsule two times a day for two weeks. Then take a 300 mg capsule once a day for two weeks. Then discontinue the Gabapentin.   Humira Pen 40 MG/0.4ML Pnkt Generic drug: Adalimumab Inject 40 mg into the skin every 14 (fourteen) days.   hydroxypropyl methylcellulose / hypromellose 2.5 % ophthalmic solution Commonly known as: ISOPTO TEARS / GONIOVISC Place 1 drop into both eyes 3 (three) times daily as needed (dry/irritated eyes.).   lisinopril 20 MG tablet Commonly known as: ZESTRIL Take 1 tablet (20 mg total) by mouth daily. What changed: when to take this   mesalamine 0.375 g 24 hr capsule Commonly known as: Apriso Take 4 capsules (1.5 g total) by mouth every morning. 1540m=4 capsules What changed: when to take this   methocarbamol 500 MG  tablet Commonly known as: ROBAXIN Take 1 tablet (500 mg total) by mouth every 6 (six) hours as needed for muscle spasms.   nitroGLYCERIN 0.4 MG SL tablet Commonly known as: NITROSTAT Place 1 tablet (0.4 mg total) under the tongue every 5 (five) minutes x 3 doses as needed for chest pain.   oxyCODONE 5 MG immediate release tablet Commonly known as: Oxy IR/ROXICODONE Take 1-2 tablets (5-10 mg total) by mouth every 6 (six) hours as needed for severe pain.   rosuvastatin 40 MG tablet Commonly known as: CRESTOR TAKE 1 TABLET BY MOUTH DAILY What changed: when to take this   traMADol 50 MG tablet Commonly known as: ULTRAM Take 1-2 tablets (50-100 mg total) by mouth every 6 (six) hours as needed for moderate pain.            Discharge Care Instructions  (From admission, onward)         Start     Ordered   01/01/19 0000  Weight bearing as tolerated     01/01/19 0740   01/01/19 0000  Change dressing    Comments: Change dressing on Wednesday, then change the dressing daily with sterile 4 x 4 inch gauze dressing and apply TED hose.   01/01/19 0740         Follow-up Information    Gaynelle Arabian, MD. Schedule an appointment as soon as possible for a visit on 01/15/2019.   Specialty: Orthopedic Surgery Contact information: [address] [place] [address] [phone]           Signed: Griffith Citron, PA-C Orthopedic Surgery 01/07/2019, 7:36 AM

## 2019-01-12 ENCOUNTER — Other Ambulatory Visit: Payer: Self-pay | Admitting: Family Medicine

## 2019-01-12 DIAGNOSIS — M19041 Primary osteoarthritis, right hand: Secondary | ICD-10-CM

## 2019-01-12 NOTE — Telephone Encounter (Signed)
Forwarding medication refill requests to the clinical pool for review. 

## 2019-02-04 NOTE — Telephone Encounter (Signed)
Pt called and is requesting to have a medication sent in for his arthritis. Pt has asked for meloxicam but it is discontinued. Please advise.

## 2019-02-07 ENCOUNTER — Other Ambulatory Visit: Payer: Self-pay

## 2019-02-07 DIAGNOSIS — M19041 Primary osteoarthritis, right hand: Secondary | ICD-10-CM

## 2019-02-07 MED ORDER — MELOXICAM 15 MG PO TABS: 15.0000 mg | ORAL_TABLET | Freq: Every day | ORAL | 0 refills | Status: DC

## 2019-02-07 NOTE — Telephone Encounter (Signed)
Call from [facility].  Pt was only off Mobic at time of surgery.  No contraindication from their side at this time.  Refilled #[id number].  Advised pt he may need appt prior to add'l refills for bloodwork.  Pt may be establishing with a practice closer to his house.  Advised if that happens, he will need to begin obtaining meds/refills at that practice.  Pt verbalizes understanding.

## 2019-02-07 NOTE — Telephone Encounter (Signed)
Reviewed chart.  Ortho surg advised to stop Mobic.  (Possibly just for immediate post-op period?)  L/m for [facility] re pt's request to refill Mobic.  From surgical perspective, would pt be ok to restart at this time?  Call to pt.  Pt states he is having a decent amount of pain in knee.  Advised want to confirm with ortho that ok to restart.  Also advised that if he is having significant pain in the knee, surgeon should address.  Pt understands.  Will wait for [facility] to return call.

## 2019-02-19 ENCOUNTER — Other Ambulatory Visit: Payer: Self-pay

## 2019-02-19 ENCOUNTER — Encounter: Payer: Self-pay | Admitting: Family Medicine

## 2019-02-19 ENCOUNTER — Ambulatory Visit (INDEPENDENT_AMBULATORY_CARE_PROVIDER_SITE_OTHER): Payer: BC Managed Care – PPO | Admitting: Family Medicine

## 2019-02-19 VITALS — BP 136/67 | HR 87 | Ht 68.0 in | Wt 179.0 lb

## 2019-02-19 DIAGNOSIS — Z23 Encounter for immunization: Secondary | ICD-10-CM | POA: Diagnosis not present

## 2019-02-19 DIAGNOSIS — R7301 Impaired fasting glucose: Secondary | ICD-10-CM

## 2019-02-19 DIAGNOSIS — K5000 Crohn's disease of small intestine without complications: Secondary | ICD-10-CM

## 2019-02-19 DIAGNOSIS — R790 Abnormal level of blood mineral: Secondary | ICD-10-CM

## 2019-02-19 DIAGNOSIS — E785 Hyperlipidemia, unspecified: Secondary | ICD-10-CM | POA: Diagnosis not present

## 2019-02-19 DIAGNOSIS — R748 Abnormal levels of other serum enzymes: Secondary | ICD-10-CM

## 2019-02-19 DIAGNOSIS — I2510 Atherosclerotic heart disease of native coronary artery without angina pectoris: Secondary | ICD-10-CM

## 2019-02-19 DIAGNOSIS — I10 Essential (primary) hypertension: Secondary | ICD-10-CM

## 2019-02-19 NOTE — Assessment & Plan Note (Signed)
Well controlled. Continue current regimen. Follow up in  6 mo  

## 2019-02-19 NOTE — Patient Instructions (Signed)
We will check your liver enzymes today.  I will let you know if they are still abnormal.

## 2019-02-19 NOTE — Assessment & Plan Note (Signed)
Due to recheck in April.

## 2019-02-19 NOTE — Assessment & Plan Note (Signed)
F/U in 6 months. Work on low sugar and carb intake.

## 2019-02-19 NOTE — Assessment & Plan Note (Signed)
Keep f/u with Cards next months.

## 2019-02-19 NOTE — Progress Notes (Addendum)
Established Patient Office Visit  Subjective:  Patient ID: Frederick Rush, male    DOB: [birth year]  Age: 68 y.o. MRN: [id number]  CC:  Chief Complaint  Patient presents with  . Establish Care    HPI Frederick Rush presents to establish care.  I have been seeing his wife for years.  He is already been seen with" help but is transferring care here.  He has a history of hypertension, coronary artery disease treated medically, Crohn's disease, and hyperlipidemia.  He is currently on mesalamine.  He has a follow-up with Dr. Shary Key at [facility] later this week.  Hypertension- Pt denies chest pain, SOB, dizziness, or heart palpitations.  Taking meds as directed w/o problems.  Denies medication side effects.    F/U CAD - no CP ro SOB.  He reports that he has a follow-up next month with cardiology.  He says he sees Dr. Donnella Bi.  He had a left knee replacement in early November.  On his preop his hemoglobin A1c was noted to be mildly elevated.  In the prediabetes range.  More recently also noted to have elevation in liver enzymes that was mild.  Unfortunately he had a reaction to gabapentin.  It caused a diffuse rash.  He then saw dermatology and they had him on steroids for an extended period of time.  On some of his preop labs it was noted that his hemoglobin A1c was mildly elevated.  No prior history of diabetes or prediabetes.  Also he had some elevated liver enzymes as well.  He says he is never had problems with his liver that he is aware of.  Lab Results  Component Value Date   HGBA1C 5.7 (H) 12/12/2018     Past Medical History:  Diagnosis Date  . Arthritis   . CAD (coronary artery disease), non obstructive by cath 07/19/11, treat medically 07/19/2011  . Crohn disease ([place])   . Hypercholesterolemia   . Hyperlipidemia   . Myocardial infarction ([place])   . Skin cancer 2006   Left shoulder; followed every six months.      Past Surgical History:  Procedure  Laterality Date  . APPENDECTOMY    . CARDIAC CATHETERIZATION  07/15/2011   Medical management  . FRACTURE SURGERY Right 2015   elbow  . HERNIA REPAIR    . KNEE SURGERY     arthroscopy  . LEFT HEART CATHETERIZATION WITH CORONARY ANGIOGRAM  07/19/2011   Procedure: LEFT HEART CATHETERIZATION WITH CORONARY ANGIOGRAM;  Surgeon: Leonie Man, MD;  Location: [facility] CATH LAB;  Service: Cardiovascular;;  . TONSILLECTOMY    . TOTAL KNEE ARTHROPLASTY Left 12/31/2018   Procedure: TOTAL KNEE ARTHROPLASTY;  Surgeon: Gaynelle Arabian, MD;  Location: [facility] ORS;  Service: Orthopedics;  Laterality: Left;  28mn    Family History  Problem Relation Age of Onset  . Cancer Mother        Breast cancer; leukemia  . Heart disease Mother 735      CABG    Social History   Socioeconomic History  . Marital status: Married    Spouse name: [name] . Number of children: 2  . Years of education: Associates  . Highest education level: Not on file  Occupational History  . Occupation: BULK DRIVER    Employer: [employer]  Tobacco Use  . Smoking status: Former Smoker    Quit date: 07/16/1998    Years since quitting: 20.6  . Smokeless tobacco: Never Used  Substance and Sexual Activity  . Alcohol use: Yes    Alcohol/week: 8.0 standard drinks    Types: 8 Cans of beer per week  . Drug use: No  . Sexual activity: Yes    Partners: Female    Birth control/protection: None, Post-menopausal  Other Topics Concern  . 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      Lives: Lives with his wife and their pets.      Tobacco:  None; quit in 2000; smoked x 15 years + 20 years = 35 years.      Alcohol:    2-4 beers per day      Exercise: job physically demanding; mows yard.      Seatbelt: 100%; no texting   Social Determinants of Health    Financial Resource Strain:   . Difficulty of Paying Living Expenses: Not on file  Food Insecurity:   . Worried About Charity fundraiser in the Last Year: Not on file  . Ran Out of Food in the Last Year: Not on file  Transportation Needs:   . Lack of Transportation (Medical): Not on file  . Lack of Transportation (Non-Medical): Not on file  Physical Activity:   . Days of Exercise per Week: Not on file  . Minutes of Exercise per Session: Not on file  Stress:   . Feeling of Stress : Not on file  Social Connections:   . Frequency of Communication with Friends and Family: Not on file  . Frequency of Social Gatherings with Friends and Family: Not on file  . Attends Religious Services: Not on file  . Active Member of Clubs or Organizations: Not on file  . Attends Archivist Meetings: Not on file  . Marital Status: Not on file  Intimate Partner Violence:   . Fear of Current or Ex-Partner: Not on file  . Emotionally Abused: Not on file  . Physically Abused: Not on file  . Sexually Abused: Not on file    Outpatient Medications Prior to Visit  Medication Sig Dispense Refill  . fluticasone (FLONASE) 50 MCG/ACT nasal spray Place 1 spray into both nostrils daily.     Marland Kitchen lisinopril (ZESTRIL) 20 MG tablet TAKE 1 TABLET(20 MG) BY MOUTH DAILY 90 tablet 1  . meloxicam (MOBIC) 15 MG tablet Take 1 tablet (15 mg total) by mouth daily. 90 tablet 0  . mesalamine (APRISO) 0.375 g 24 hr capsule Take 4 capsules (1.5 g total) by mouth every morning. 1590m=4 capsules (Patient taking differently: Take 1,500 mg by mouth daily at 2 am. 15018m4 capsules) 360 capsule 1  . rosuvastatin (CRESTOR) 40 MG tablet TAKE 1 TABLET BY MOUTH DAILY (Patient taking differently: Take 40 mg by mouth daily in the afternoon. ) 90 tablet 3  . triamcinolone cream (KENALOG) 0.1 %     . amLODipine (NORVASC) 5 MG tablet TAKE 1 TABLET(5 MG) BY MOUTH DAILY (Patient taking differently: Take 5 mg by mouth daily in the  afternoon. ) 90 tablet 0  . gabapentin (NEURONTIN) 300 MG capsule Take 1 capsule (300 mg total) by mouth 3 (three) times daily. Take a 300 mg capsule three times a day for two weeks following surgery.Then take a 300 mg capsule two times a  day for two weeks. Then take a 300 mg capsule once a day for two weeks. Then discontinue the Gabapentin. 84 capsule 0  . HUMIRA PEN 40 MG/0.4ML PNKT Inject 40 mg into the skin every 14 (fourteen) days.    . hydroxypropyl methylcellulose / hypromellose (ISOPTO TEARS / GONIOVISC) 2.5 % ophthalmic solution Place 1 drop into both eyes 3 (three) times daily as needed (dry/irritated eyes.).    Marland Kitchen methocarbamol (ROBAXIN) 500 MG tablet Take 1 tablet (500 mg total) by mouth every 6 (six) hours as needed for muscle spasms. 40 tablet 0  . nitroGLYCERIN (NITROSTAT) 0.4 MG SL tablet Place 1 tablet (0.4 mg total) under the tongue every 5 (five) minutes x 3 doses as needed for chest pain. 25 tablet 4  . oxyCODONE (OXY IR/ROXICODONE) 5 MG immediate release tablet Take 1-2 tablets (5-10 mg total) by mouth every 6 (six) hours as needed for severe pain. 56 tablet 0  . traMADol (ULTRAM) 50 MG tablet Take 1-2 tablets (50-100 mg total) by mouth every 6 (six) hours as needed for moderate pain. 40 tablet 0   No facility-administered medications prior to visit.    Allergies  Allergen Reactions  . Latex     Pt stated that latex causes itching.  . Gabapentin Rash    ROS Review of Systems  Constitutional: Negative for diaphoresis, fever and unexpected weight change.  HENT: Negative for hearing loss, rhinorrhea, sneezing and tinnitus.   Eyes: Negative for visual disturbance.  Respiratory: Negative for cough and wheezing.   Cardiovascular: Negative for chest pain and palpitations.  Gastrointestinal: Negative for blood in stool, diarrhea, nausea and vomiting.  Genitourinary: Negative for discharge and dysuria.  Musculoskeletal: Positive for arthralgias. Negative for myalgias.  Skin:  Positive for rash.  Neurological: Negative for headaches.  Hematological: Negative for adenopathy.  Psychiatric/Behavioral: Negative for dysphoric mood and sleep disturbance. The patient is not nervous/anxious.       Objective:    Physical Exam  Constitutional: He is oriented to person, place, and time. He appears well-developed and well-nourished.  HENT:  Head: Normocephalic and atraumatic.  Neck:  No carotid bruits  Cardiovascular: Normal rate, regular rhythm and normal heart sounds.  Pulmonary/Chest: Effort normal and breath sounds normal.  Neurological: He is alert and oriented to person, place, and time.  Skin: Skin is warm and dry.  Psychiatric: He has a normal mood and affect. His behavior is normal.    BP 136/67   Pulse 87   Ht 5' 8"  (1.727 m)   Wt 179 lb (81.2 kg)   SpO2 98%   BMI 27.22 kg/m  Wt Readings from Last 3 Encounters:  02/19/19 179 lb (81.2 kg)  12/31/18 183 lb 12.8 oz (83.4 kg)  12/24/18 183 lb 12.8 oz (83.4 kg)     There are no preventive care reminders to display for this patient.  There are no preventive care reminders to display for this patient.  Lab Results  Component Value Date   TSH 4.890 (H) 06/19/2018   Lab Results  Component Value Date   WBC 11.0 (H) 01/01/2019   HGB 10.8 (L) 01/01/2019   HCT 33.5 (L) 01/01/2019   MCV 95.2 01/01/2019   PLT 260 01/01/2019   Lab Results  Component Value Date   NA 136 01/01/2019   K 4.4 01/01/2019   CO2 26 01/01/2019   GLUCOSE 138 (H) 01/01/2019   BUN 15 01/01/2019   CREATININE 0.83 01/01/2019   BILITOT 0.3 02/19/2019   ALKPHOS  35 (L) 12/24/2018   AST 22 02/19/2019   ALT 29 02/19/2019   PROT 6.5 02/19/2019   ALBUMIN 4.5 12/24/2018   CALCIUM 9.2 02/19/2019   ANIONGAP 7 01/01/2019   Lab Results  Component Value Date   CHOL 134 06/19/2018   Lab Results  Component Value Date   HDL 60 06/19/2018   Lab Results  Component Value Date   LDLCALC 56 06/19/2018   Lab Results  Component  Value Date   TRIG 89 06/19/2018   Lab Results  Component Value Date   CHOLHDL 2.2 06/19/2018   Lab Results  Component Value Date   HGBA1C 5.7 (H) 12/12/2018      Assessment & Plan:   Problem List Items Addressed This Visit      Cardiovascular and Mediastinum   Essential hypertension - Primary    Well controlled. Continue current regimen. Follow up in  6 mo      CAD (coronary artery disease), non obstructive by cath 07/19/11, treat medically (Chronic)    Keep f/u with Cards next months.         Digestive   Crohn disease ([place]) (Chronic)    Follows with digestive health.  Typically sees [facility].        Endocrine   IFG (impaired fasting glucose)    F/U in 6 months. Work on low sugar and carb intake.       Relevant Orders   Hepatic function panel (Completed)   Hepatitis panel, acute (Completed)     Other   Dyslipidemia (Chronic)    Due to recheck in April.       Other Visit Diagnoses    Elevated liver enzymes       Relevant Orders   Hepatic function panel (Completed)   Hepatitis panel, acute (Completed)   Calcium blood decreased       Relevant Orders   Calcium (Completed)     Discussed need for shingles vaccine.  First 1 given today.  Repeat due in 2 to 6 months.  No orders of the defined types were placed in this encounter.   Follow-up: Return in about 6 months (around 08/20/2019) for Hypertension and recheck A1C/sugar.    Beatrice Lecher, MD

## 2019-02-19 NOTE — Assessment & Plan Note (Signed)
Follows with digestive health.  Typically sees [facility].

## 2019-02-20 ENCOUNTER — Encounter: Payer: Self-pay | Admitting: Family Medicine

## 2019-02-20 LAB — CALCIUM: Calcium: 9.2 mg/dL (ref 8.6–10.3)

## 2019-02-20 LAB — HEPATIC FUNCTION PANEL
AG Ratio: 1.8 (calc) (ref 1.0–2.5)
ALT: 29 U/L (ref 9–46)
AST: 22 U/L (ref 10–35)
Albumin: 4.2 g/dL (ref 3.6–5.1)
Alkaline phosphatase (APISO): 27 U/L — ABNORMAL LOW (ref 35–144)
Bilirubin, Direct: 0.1 mg/dL (ref 0.0–0.2)
Globulin: 2.3 g/dL (calc) (ref 1.9–3.7)
Indirect Bilirubin: 0.2 mg/dL (calc) (ref 0.2–1.2)
Total Bilirubin: 0.3 mg/dL (ref 0.2–1.2)
Total Protein: 6.5 g/dL (ref 6.1–8.1)

## 2019-02-20 LAB — HEPATITIS PANEL, ACUTE
Hep A IgM: NONREACTIVE
Hep B C IgM: NONREACTIVE
Hepatitis B Surface Ag: NONREACTIVE
Hepatitis C Ab: NONREACTIVE
SIGNAL TO CUT-OFF: 0 (ref <1.00)

## 2019-03-01 ENCOUNTER — Encounter: Payer: Self-pay | Admitting: Family Medicine

## 2019-03-01 DIAGNOSIS — M0760 Enteropathic arthropathies, unspecified site: Secondary | ICD-10-CM | POA: Insufficient documentation

## 2019-04-09 ENCOUNTER — Other Ambulatory Visit: Payer: Self-pay

## 2019-04-09 ENCOUNTER — Encounter: Payer: Self-pay | Admitting: Cardiovascular Disease

## 2019-04-09 ENCOUNTER — Ambulatory Visit: Payer: BC Managed Care – PPO | Admitting: Cardiovascular Disease

## 2019-04-09 DIAGNOSIS — I2510 Atherosclerotic heart disease of native coronary artery without angina pectoris: Secondary | ICD-10-CM

## 2019-04-09 DIAGNOSIS — E785 Hyperlipidemia, unspecified: Secondary | ICD-10-CM | POA: Diagnosis not present

## 2019-04-09 DIAGNOSIS — I10 Essential (primary) hypertension: Secondary | ICD-10-CM | POA: Diagnosis not present

## 2019-04-09 NOTE — Assessment & Plan Note (Signed)
History of nonobstructive CAD by cath performed Dr. Ellyn Hack 07/15/2011.  Patient denies chest pain or shortness of breath.

## 2019-04-09 NOTE — Patient Instructions (Signed)
Medication Instructions:  Your physician recommends that you continue on your current medications as directed. Please refer to the Current Medication list given to you today.  If you need a refill on your cardiac medications before your next appointment, please call your pharmacy.   Lab work: NONE  Testing/Procedures: NONE  Follow-Up: At [facility], you and your health needs are our priority.  As part of our continuing mission to provide you with exceptional heart care, we have created designated Provider Care Teams.  These Care Teams include your primary Cardiologist (physician) and Advanced Practice Providers (APPs -  Physician Assistants and Nurse Practitioners) who all work together to provide you with the care you need, when you need it. You may see Quay Burow, MD or one of the following Advanced Practice Providers on your designated Care Team:    Kerin Ransom, PA-C  [place], [place]  Coletta Memos,   Your physician wants you to follow-up in: 1 year with Dr. Andria Rhein will receive a reminder letter in the mail two months in advance. If you don't receive a letter, please call our office to schedule the follow-up appointment.

## 2019-04-09 NOTE — Assessment & Plan Note (Signed)
History of hyperlipidemia on statin therapy with lipid profile performed 06/11/2018 revealing a total cholesterol of 134, LDL 56 and HDL of 60.

## 2019-04-09 NOTE — Progress Notes (Signed)
04/09/2019 Frederick Rush   1950/12/02  [id number]  Primary Physician Hali Marry, MD Primary Cardiologist: Lorretta Harp MD Garret Redd[place], [place], [place]  HPI:  Frederick Rush is a 69 y.o.  mildly overweight, married, Caucasian male, father of two, grandfather to two grandchildren, who I last saw in the office  03/30/2018 has a history of noncritical CAD found on cath performed by Dr. Ula Lingo on Jul 15, 2011. His other problems include hyperlipidemia and remote tobacco abuse.   Since I saw him in the office a year ago he continues to do well.  He had a uncomplicated left total knee replacement on 12/31/2018 by Dr. Ricki Rodriguez which he is rehabilitated from.  He was on amlodipine which he subsequently has stopped although his blood pressures at home which she measures on a routine basis are under good control on lisinopril.  He denies chest pain or shortness of breath.  Current Meds  Medication Sig  . fluticasone (FLONASE) 50 MCG/ACT nasal spray Place 1 spray into both nostrils daily.   Marland Kitchen lisinopril (ZESTRIL) 20 MG tablet TAKE 1 TABLET(20 MG) BY MOUTH DAILY  . meloxicam (MOBIC) 15 MG tablet Take 1 tablet (15 mg total) by mouth daily.  . mesalamine (APRISO) 0.375 g 24 hr capsule Take 4 capsules (1.5 g total) by mouth every morning. 1521m=4 capsules (Patient taking differently: Take 1,500 mg by mouth daily at 2 am. 15052m4 capsules)  . rosuvastatin (CRESTOR) 40 MG tablet TAKE 1 TABLET BY MOUTH DAILY (Patient taking differently: Take 40 mg by mouth daily in the afternoon. )  . triamcinolone cream (KENALOG) 0.1 %      Allergies  Allergen Reactions  . Latex     Pt stated that latex causes itching.  . Gabapentin Rash    Social History   Socioeconomic History  . Marital status: Married    Spouse name: RoShirlean Mylar. Number of children: 2  . Years of education: Associates  . Highest education level: Not on file  Occupational History  . Occupation: BULK DRIVER   Employer: [employer]  Tobacco Use  . Smoking status: Former Smoker    Quit date: 07/16/1998    Years since quitting: 20.7  . Smokeless tobacco: Never Used  Substance and Sexual Activity  . Alcohol use: Yes    Alcohol/week: 8.0 standard drinks    Types: 8 Cans of beer per week  . Drug use: No  . Sexual activity: Yes    Partners: Female    Birth control/protection: None, Post-menopausal  Other Topics Concern  . 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      Lives: Lives with his wife and their pets.      Tobacco:  None; quit in 2000; smoked x 15 years + 20 years = 35 years.      Alcohol:    2-4 beers per day      Exercise: job physically demanding; mows yard.      Seatbelt: 100%; no texting   Social Determinants of Health   Financial Resource Strain:   . Difficulty of Paying Living Expenses: Not on file  Food Insecurity:   . Worried About RuCharity fundraisern the Last Year: Not  on file  . Ran Out of Food in the Last Year: Not on file  Transportation Needs:   . Lack of Transportation (Medical): Not on file  . Lack of Transportation (Non-Medical): Not on file  Physical Activity:   . Days of Exercise per Week: Not on file  . Minutes of Exercise per Session: Not on file  Stress:   . Feeling of Stress : Not on file  Social Connections:   . Frequency of Communication with Friends and Family: Not on file  . Frequency of Social Gatherings with Friends and Family: Not on file  . Attends Religious Services: Not on file  . Active Member of Clubs or Organizations: Not on file  . Attends Archivist Meetings: Not on file  . Marital Status: Not on file  Intimate Partner Violence:   . Fear of Current or Ex-Partner: Not on file  . Emotionally Abused: Not on file  . Physically Abused: Not on file    . Sexually Abused: Not on file     Review of Systems: General: negative for chills, fever, night sweats or weight changes.  Cardiovascular: negative for chest pain, dyspnea on exertion, edema, orthopnea, palpitations, paroxysmal nocturnal dyspnea or shortness of breath Dermatological: negative for rash Respiratory: negative for cough or wheezing Urologic: negative for hematuria Abdominal: negative for nausea, vomiting, diarrhea, bright red blood per rectum, melena, or hematemesis Neurologic: negative for visual changes, syncope, or dizziness All other systems reviewed and are otherwise negative except as noted above.    Blood pressure (!) 150/92, pulse 67, height 5' 8"  (1.727 m), weight 185 lb (83.9 kg).  General appearance: alert and no distress Neck: no adenopathy, no carotid bruit, no JVD, supple, symmetrical, trachea midline and thyroid not enlarged, symmetric, no tenderness/mass/nodules Lungs: clear to auscultation bilaterally Heart: regular rate and rhythm, S1, S2 normal, no murmur, click, rub or gallop Extremities: extremities normal, atraumatic, no cyanosis or edema Pulses: 2+ and symmetric Skin: Skin color, texture, turgor normal. No rashes or lesions Neurologic: Alert and oriented X 3, normal strength and tone. Normal symmetric reflexes. Normal coordination and gait  EKG not performed today  ASSESSMENT AND PLAN:   Dyslipidemia History of hyperlipidemia on statin therapy with lipid profile performed 06/11/2018 revealing a total cholesterol of 134, LDL 56 and HDL of 60.  CAD (coronary artery disease), non obstructive by cath 07/19/11, treat medically History of nonobstructive CAD by cath performed Dr. Ellyn Hack 07/15/2011.  Patient denies chest pain or shortness of breath.  Essential hypertension History of essential hypertension with blood pressure measured today 150/92.  He is on lisinopril 20 mg a day.  He says normally his blood pressure is much lower than this.  He had  been on amlodipine in addition which he no longer takes.      Lorretta Harp MD FACP,FACC,FAHA, [facility] 04/09/2019 2:12 PM

## 2019-04-09 NOTE — Assessment & Plan Note (Signed)
History of essential hypertension with blood pressure measured today 150/92.  He is on lisinopril 20 mg a day.  He says normally his blood pressure is much lower than this.  He had been on amlodipine in addition which he no longer takes.

## 2019-04-13 ENCOUNTER — Ambulatory Visit: Payer: BC Managed Care – PPO | Attending: Internal Medicine

## 2019-04-13 DIAGNOSIS — Z23 Encounter for immunization: Secondary | ICD-10-CM

## 2019-04-13 NOTE — Progress Notes (Signed)
   Covid-19 Vaccination Clinic  Name:  Nimesh Riolo    MRN: [id number] DOB: August 08, 1950  04/13/2019  Mr. Shiraishi was observed post Covid-19 immunization for 15 minutes without incidence. He was provided with Vaccine Information Sheet and instruction to access the V-Safe system.   Mr. Cuevas was instructed to call 911 with any severe reactions post vaccine: Marland Kitchen Difficulty breathing  . Swelling of your face and throat  . A fast heartbeat  . A bad rash all over your body  . Dizziness and weakness    Immunizations Administered    Name Date Dose VIS Date Route   Pfizer COVID-19 Vaccine 04/13/2019  9:26 AM 0.3 mL 02/01/2019 Intramuscular   Manufacturer: [place]   Lot: [id number]   [place]: [id number]

## 2019-05-07 ENCOUNTER — Ambulatory Visit: Payer: BC Managed Care – PPO | Attending: Internal Medicine

## 2019-05-07 DIAGNOSIS — Z23 Encounter for immunization: Secondary | ICD-10-CM

## 2019-05-07 NOTE — Progress Notes (Signed)
   Covid-19 Vaccination Clinic  Name:  Jlen Wintle    MRN: [id number] DOB: 1950/05/09  05/07/2019  Mr. Stork was observed post Covid-19 immunization for 15 minutes without incident. He was provided with Vaccine Information Sheet and instruction to access the V-Safe system.   Mr. Corti was instructed to call 911 with any severe reactions post vaccine: Marland Kitchen Difficulty breathing  . Swelling of face and throat  . A fast heartbeat  . A bad rash all over body  . Dizziness and weakness   Immunizations Administered    Name Date Dose VIS Date Route   Pfizer COVID-19 Vaccine 05/07/2019 10:11 AM 0.3 mL 02/01/2019 Intramuscular   Manufacturer: [place]   Lot: [id number]   [place]: [id number]

## 2019-05-10 ENCOUNTER — Other Ambulatory Visit: Payer: Self-pay | Admitting: *Deleted

## 2019-05-10 DIAGNOSIS — M19041 Primary osteoarthritis, right hand: Secondary | ICD-10-CM

## 2019-05-10 MED ORDER — MELOXICAM 15 MG PO TABS: 15.0000 mg | ORAL_TABLET | Freq: Every day | ORAL | 0 refills | Status: DC

## 2019-05-31 ENCOUNTER — Other Ambulatory Visit: Payer: Self-pay | Admitting: Cardiovascular Disease

## 2019-05-31 MED ORDER — AMLODIPINE BESYLATE 5 MG PO TABS: ORAL_TABLET | ORAL | 3 refills | Status: DC

## 2019-05-31 NOTE — Telephone Encounter (Signed)
*  STAT* If patient is at the pharmacy, call can be transferred to refill team.   1. Which medications need to be refilled? (please list name of each medication and dose if known) amLODipine (NORVASC) 5 MG tablet  2. Which pharmacy/location (including street and city if local pharmacy) is medication to be sent to? [facility], [place] AT [place]  3. Do they need a 30 day or 90 day supply? 90 day  Patient has 3 pills left

## 2019-06-28 ENCOUNTER — Other Ambulatory Visit: Payer: Self-pay | Admitting: Cardiovascular Disease

## 2019-06-28 NOTE — Telephone Encounter (Signed)
*  STAT* If patient is at the pharmacy, call can be transferred to refill team.   1. Which medications need to be refilled? (please list name of each medication and dose if known)   lisinopril (ZESTRIL) 20 MG tablet    2. Which pharmacy/location (including street and city if local pharmacy) is medication to be sent to?  [place] [facility], [place] AT [place]  3. Do they need a 30 day or 90 day supply? 90   Patient said he is having a hard time getting his PCP to fill this medication. He would like Dr. Gwenlyn Found to write a new rx for this medication

## 2019-07-02 MED ORDER — LISINOPRIL 20 MG PO TABS: 20.0000 mg | ORAL_TABLET | Freq: Every day | ORAL | 2 refills | Status: DC

## 2019-07-09 ENCOUNTER — Other Ambulatory Visit: Payer: Self-pay | Admitting: Cardiovascular Disease

## 2019-07-09 MED ORDER — LISINOPRIL 20 MG PO TABS: 20.0000 mg | ORAL_TABLET | Freq: Every day | ORAL | 3 refills | Status: DC

## 2019-07-09 NOTE — Telephone Encounter (Signed)
°*  STAT* If patient is at the pharmacy, call can be transferred to refill team.   1. Which medications need to be refilled? (please list name of each medication and dose if known)   lisinopril (ZESTRIL) 20 MG tablet    2. Which pharmacy/location (including street and city if local pharmacy) is medication to be sent to? [facility], [place] AT [place]  3. Do they need a 30 day or 90 day supply? [place]

## 2019-07-26 ENCOUNTER — Other Ambulatory Visit: Payer: Self-pay

## 2019-07-26 MED ORDER — ROSUVASTATIN CALCIUM 40 MG PO TABS: 40.0000 mg | ORAL_TABLET | Freq: Every day | ORAL | 3 refills | Status: DC

## 2019-08-07 ENCOUNTER — Other Ambulatory Visit: Payer: Self-pay | Admitting: *Deleted

## 2019-08-07 DIAGNOSIS — M19041 Primary osteoarthritis, right hand: Secondary | ICD-10-CM

## 2019-08-07 MED ORDER — MELOXICAM 15 MG PO TABS: 15.0000 mg | ORAL_TABLET | Freq: Every day | ORAL | 0 refills | Status: DC

## 2019-08-16 ENCOUNTER — Ambulatory Visit (INDEPENDENT_AMBULATORY_CARE_PROVIDER_SITE_OTHER): Payer: BC Managed Care – PPO | Admitting: Family Medicine

## 2019-08-16 ENCOUNTER — Encounter: Payer: Self-pay | Admitting: Family Medicine

## 2019-08-16 ENCOUNTER — Other Ambulatory Visit: Payer: Self-pay

## 2019-08-16 VITALS — BP 122/77 | HR 66 | Ht 68.0 in | Wt 187.0 lb

## 2019-08-16 DIAGNOSIS — K5000 Crohn's disease of small intestine without complications: Secondary | ICD-10-CM

## 2019-08-16 DIAGNOSIS — E785 Hyperlipidemia, unspecified: Secondary | ICD-10-CM

## 2019-08-16 DIAGNOSIS — I10 Essential (primary) hypertension: Secondary | ICD-10-CM | POA: Diagnosis not present

## 2019-08-16 DIAGNOSIS — R7301 Impaired fasting glucose: Secondary | ICD-10-CM | POA: Diagnosis not present

## 2019-08-16 DIAGNOSIS — Z125 Encounter for screening for malignant neoplasm of prostate: Secondary | ICD-10-CM

## 2019-08-16 DIAGNOSIS — M5432 Sciatica, left side: Secondary | ICD-10-CM

## 2019-08-16 DIAGNOSIS — I2510 Atherosclerotic heart disease of native coronary artery without angina pectoris: Secondary | ICD-10-CM

## 2019-08-16 DIAGNOSIS — Z23 Encounter for immunization: Secondary | ICD-10-CM

## 2019-08-16 LAB — POCT GLYCOSYLATED HEMOGLOBIN (HGB A1C): Hemoglobin A1C: 5.5 % (ref 4.0–5.6)

## 2019-08-16 MED ORDER — PREDNISONE 20 MG PO TABS: 40.0000 mg | ORAL_TABLET | Freq: Every day | ORAL | 0 refills | Status: DC

## 2019-08-16 NOTE — Assessment & Plan Note (Signed)
No recent flares or exacerbations.

## 2019-08-16 NOTE — Assessment & Plan Note (Signed)
Pressure looks much better today.  It was elevated when I saw him in December and it looks great today.

## 2019-08-16 NOTE — Assessment & Plan Note (Addendum)
Takes his statin faithfully and denies any side effects.  Due to repeat lipid panel and check liver enzymes.

## 2019-08-16 NOTE — Assessment & Plan Note (Signed)
A1C looks fantastic!!  He has brought it down to 5.5 which is phenomenal so just really encouraged him to keep up with the dietary changes that he is made.

## 2019-08-16 NOTE — Progress Notes (Signed)
Established Patient Office Visit  Subjective:  Patient ID: Frederick Rush, male    DOB: Dec 31, 1950  Age: 69 y.o. MRN: [id number]  CC:  Chief Complaint  Patient presents with  . Hypertension  . ifg    HPI Frederick Rush presents for   Hypertension- Pt denies chest pain, SOB, dizziness, or heart palpitations.  Taking meds as directed w/o problems.  Denies medication side effects.    Impaired fasting glucose-no increased thirst or urination. No symptoms consistent with hypoglycemia.  F/U Crohns - Follows with Dr. Shary Key.   He is also been experiencing sciatica down his left side going all the way down to his foot it is been bothering him for almost 4 weeks.  He has had episodes previously also on that left side but says it is never lasted for weeks it usually a couple of weeks and then it seems to resolve on its own.  And using heat.  Also takes Mobic as needed.  He does not member any specific triggers such as heavy lifting etc.  He said he really has not had a lot of low back pain it seems to originate in the buttock area.  Past Medical History:  Diagnosis Date  . Arthritis   . CAD (coronary artery disease), non obstructive by cath 07/19/11, treat medically 07/19/2011  . Crohn disease ([place])   . Hypercholesterolemia   . Hyperlipidemia   . Myocardial infarction ([place])   . Skin cancer 2006   Left shoulder; followed every six months.      Past Surgical History:  Procedure Laterality Date  . APPENDECTOMY    . CARDIAC CATHETERIZATION  07/15/2011   Medical management  . ELBOW SURGERY Right   . FRACTURE SURGERY Right 2015   elbow  . HERNIA REPAIR    . KNEE SURGERY     arthroscopy  . LEFT HEART CATHETERIZATION WITH CORONARY ANGIOGRAM  07/19/2011   Procedure: LEFT HEART CATHETERIZATION WITH CORONARY ANGIOGRAM;  Surgeon: Leonie Man, MD;  Location: [facility] CATH LAB;  Service: Cardiovascular;;  . TONSILLECTOMY    . TOTAL KNEE ARTHROPLASTY Left 12/31/2018   Procedure: TOTAL  KNEE ARTHROPLASTY;  Surgeon: Gaynelle Arabian, MD;  Location: [facility] ORS;  Service: Orthopedics;  Laterality: Left;  41mn  . VASECTOMY      Family History  Problem Relation Age of Onset  . Cancer Mother        Breast cancer; leukemia  . Heart disease Mother 736      CABG    Social History   Socioeconomic History  . Marital status: Married    Spouse name: [name] . Number of children: 2  . Years of education: Associates  . Highest education level: Not on file  Occupational History  . Occupation: BULK DRIVER    Employer: [employer]  Tobacco Use  . Smoking status: Former Smoker    Quit date: 07/16/1998    Years since quitting: 21.0  . Smokeless tobacco: Never Used  Vaping Use  . Vaping Use: Never used  Substance and Sexual Activity  . Alcohol use: Yes    Alcohol/week: 8.0 standard drinks    Types: 8 Cans of beer per week  . Drug use: No  . Sexual activity: Yes    Partners: Female    Birth control/protection: None, Post-menopausal  Other Topics Concern  . 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      Lives: Lives with his wife and their pets.      Tobacco:  None; quit in 2000; smoked x 15 years + 20 years = 35 years.      Alcohol:    2-4 beers per day      Exercise: job physically demanding; mows yard.      Seatbelt: 100%; no texting   Social Determinants of Health   Financial Resource Strain:   . Difficulty of Paying Living Expenses:   Food Insecurity:   . Worried About Charity fundraiser in the Last Year:   . Arboriculturist in the Last Year:   Transportation Needs:   . Film/video editor (Medical):   Marland Kitchen Lack of Transportation (Non-Medical):   Physical Activity:   . Days of Exercise per Week:   . Minutes of Exercise per Session:   Stress:   . Feeling of Stress :   Social  Connections:   . Frequency of Communication with Friends and Family:   . Frequency of Social Gatherings with Friends and Family:   . Attends Religious Services:   . Active Member of Clubs or Organizations:   . Attends Archivist Meetings:   Marland Kitchen Marital Status:   Intimate Partner Violence:   . Fear of Current or Ex-Partner:   . Emotionally Abused:   Marland Kitchen Physically Abused:   . Sexually Abused:     Outpatient Medications Prior to Visit  Medication Sig Dispense Refill  . amLODipine (NORVASC) 5 MG tablet TAKE 1 TABLET(5 MG) BY MOUTH DAILY 90 tablet 3  . fluticasone (FLONASE) 50 MCG/ACT nasal spray Place 1 spray into both nostrils daily.     Marland Kitchen lisinopril (ZESTRIL) 20 MG tablet Take 1 tablet (20 mg total) by mouth daily. 90 tablet 3  . meloxicam (MOBIC) 15 MG tablet Take 1 tablet (15 mg total) by mouth daily. 90 tablet 0  . mesalamine (APRISO) 0.375 g 24 hr capsule Take 4 capsules (1.5 g total) by mouth every morning. 1510m=4 capsules (Patient taking differently: Take 1,500 mg by mouth daily at 2 am. 15024m4 capsules) 360 capsule 1  . rosuvastatin (CRESTOR) 40 MG tablet Take 1 tablet (40 mg total) by mouth daily. 90 tablet 3  . triamcinolone cream (KENALOG) 0.1 %      No facility-administered medications prior to visit.    Allergies  Allergen Reactions  . Latex     Pt stated that latex causes itching.  . Gabapentin Rash    ROS Review of Systems    Objective:    Physical Exam Constitutional:      Appearance: He is well-developed.  HENT:     Head: Normocephalic and atraumatic.  Cardiovascular:     Rate and Rhythm: Normal rate and regular rhythm.     Heart sounds: Normal heart sounds.  Pulmonary:     Effort: Pulmonary effort is normal.     Breath sounds: Normal breath sounds.  Skin:    General: Skin is warm and dry.  Neurological:     Mental Status: He is alert and oriented to person, place, and time.  Psychiatric:        Behavior: Behavior normal.     BP  122/77   Pulse 66   Ht 5' 8"  (1.727 m)   Wt 187 lb (  84.8 kg)   SpO2 98%   BMI 28.43 kg/m  Wt Readings from Last 3 Encounters:  08/16/19 187 lb (84.8 kg)  04/09/19 185 lb (83.9 kg)  02/19/19 179 lb (81.2 kg)     There are no preventive care reminders to display for this patient.  There are no preventive care reminders to display for this patient.  Lab Results  Component Value Date   TSH 4.890 (H) 06/19/2018   Lab Results  Component Value Date   WBC 11.0 (H) 01/01/2019   HGB 10.8 (L) 01/01/2019   HCT 33.5 (L) 01/01/2019   MCV 95.2 01/01/2019   PLT 260 01/01/2019   Lab Results  Component Value Date   NA 136 01/01/2019   K 4.4 01/01/2019   CO2 26 01/01/2019   GLUCOSE 138 (H) 01/01/2019   BUN 15 01/01/2019   CREATININE 0.83 01/01/2019   BILITOT 0.3 02/19/2019   ALKPHOS 35 (L) 12/24/2018   AST 22 02/19/2019   ALT 29 02/19/2019   PROT 6.5 02/19/2019   ALBUMIN 4.5 12/24/2018   CALCIUM 9.2 02/19/2019   ANIONGAP 7 01/01/2019   Lab Results  Component Value Date   CHOL 134 06/19/2018   Lab Results  Component Value Date   HDL 60 06/19/2018   Lab Results  Component Value Date   LDLCALC 56 06/19/2018   Lab Results  Component Value Date   TRIG 89 06/19/2018   Lab Results  Component Value Date   CHOLHDL 2.2 06/19/2018   Lab Results  Component Value Date   HGBA1C 5.5 08/16/2019      Assessment & Plan:   Problem List Items Addressed This Visit      Cardiovascular and Mediastinum   Essential hypertension - Primary    Pressure looks much better today.  It was elevated when I saw him in December and it looks great today.      Relevant Orders   COMPLETE METABOLIC PANEL WITH GFR   Lipid panel   CAD (coronary artery disease), non obstructive by cath 07/19/11, treat medically (Chronic)    He follows with Dr. Alvester Chou yearly.        Digestive   Crohn disease ([place]) (Chronic)    No recent flares or exacerbations.        Endocrine   IFG (impaired fasting  glucose)    A1C looks fantastic!!  He has brought it down to 5.5 which is phenomenal so just really encouraged him to keep up with the dietary changes that he is made.      Relevant Orders   POCT glycosylated hemoglobin (Hb A1C) (Completed)   COMPLETE METABOLIC PANEL WITH GFR   Lipid panel     Other   Dyslipidemia (Chronic)    Takes his statin faithfully and denies any side effects.  Due to repeat lipid panel and check liver enzymes.       Other Visit Diagnoses    Screening for malignant neoplasm of prostate       Relevant Orders   PSA   Left sciatic nerve pain       Relevant Medications   predniSONE (DELTASONE) 20 MG tablet     Left sciatic nerve pain-we will treat with a burst of prednisone and given exercises for the piriformis muscle which I think would be helpful for him.   Due for up-to-date lipid panel and screening PSA.  Meds ordered this encounter  Medications  . predniSONE (DELTASONE) 20 MG tablet    Sig: Take  2 tablets (40 mg total) by mouth daily with breakfast.    Dispense:  10 tablet    Refill:  0    Follow-up: Return in about 6 months (around 02/15/2020) for Hypertension and check A1C .     Beatrice Lecher, MD

## 2019-08-16 NOTE — Assessment & Plan Note (Signed)
He follows with Dr. Alvester Chou yearly.

## 2019-08-20 ENCOUNTER — Ambulatory Visit: Payer: BC Managed Care – PPO | Admitting: Family Medicine

## 2019-08-20 LAB — COMPLETE METABOLIC PANEL WITH GFR
AG Ratio: 1.8 (calc) (ref 1.0–2.5)
ALT: 21 U/L (ref 9–46)
AST: 22 U/L (ref 10–35)
Albumin: 4.1 g/dL (ref 3.6–5.1)
Alkaline phosphatase (APISO): 37 U/L (ref 35–144)
BUN/Creatinine Ratio: 29 (calc) — ABNORMAL HIGH (ref 6–22)
BUN: 27 mg/dL — ABNORMAL HIGH (ref 7–25)
CO2: 27 mmol/L (ref 20–32)
Calcium: 9.5 mg/dL (ref 8.6–10.3)
Chloride: 101 mmol/L (ref 98–110)
Creat: 0.93 mg/dL (ref 0.70–1.18)
GFR, Est African American: 96 mL/min/{1.73_m2} (ref >=60)
GFR, Est Non African American: 83 mL/min/{1.73_m2} (ref >=60)
Globulin: 2.3 g/dL (calc) (ref 1.9–3.7)
Glucose, Bld: 110 mg/dL — ABNORMAL HIGH (ref 65–99)
Potassium: 4.1 mmol/L (ref 3.5–5.3)
Sodium: 136 mmol/L (ref 135–146)
Total Bilirubin: 0.4 mg/dL (ref 0.2–1.2)
Total Protein: 6.4 g/dL (ref 6.1–8.1)

## 2019-08-20 LAB — LIPID PANEL
Cholesterol: 116 mg/dL (ref <200)
HDL: 46 mg/dL (ref >=40)
LDL Cholesterol (Calc): 56 mg/dL (calc)
Non-HDL Cholesterol (Calc): 70 mg/dL (calc) (ref <130)
Total CHOL/HDL Ratio: 2.5 (calc) (ref <5.0)
Triglycerides: 65 mg/dL (ref <150)

## 2019-08-20 LAB — PSA: PSA: 0.2 ng/mL (ref <=4.0)

## 2019-08-23 ENCOUNTER — Encounter: Payer: Self-pay | Admitting: Family Medicine

## 2019-08-23 NOTE — Progress Notes (Signed)
Colon entered

## 2019-11-04 ENCOUNTER — Other Ambulatory Visit: Payer: Self-pay | Admitting: Family Medicine

## 2019-11-04 DIAGNOSIS — M19041 Primary osteoarthritis, right hand: Secondary | ICD-10-CM

## 2019-12-23 ENCOUNTER — Ambulatory Visit: Payer: Medicare Other | Attending: Internal Medicine

## 2019-12-23 ENCOUNTER — Other Ambulatory Visit (HOSPITAL_BASED_OUTPATIENT_CLINIC_OR_DEPARTMENT_OTHER): Payer: Self-pay | Admitting: Internal Medicine

## 2019-12-23 DIAGNOSIS — Z23 Encounter for immunization: Secondary | ICD-10-CM

## 2019-12-23 NOTE — Progress Notes (Signed)
   Covid-19 Vaccination Clinic  Name:  Frederick Rush    MRN: [id number] DOB: 31-Jan-1951  12/23/2019  [name] was observed post Covid-19 immunization for 15 minutes without incident. He was provided with Vaccine Information Sheet and instruction to access the V-Safe system.   [name] was instructed to call 911 with any severe reactions post vaccine: Marland Kitchen Difficulty breathing  . Swelling of face and throat  . A fast heartbeat  . A bad rash all over body  . Dizziness and weakness

## 2020-01-01 MED FILL — PFIZER-BIONTECH COVID-19 VA: 30 | 1 days supply | Qty: 0 | Fill #0

## 2020-02-01 ENCOUNTER — Other Ambulatory Visit: Payer: Self-pay | Admitting: Family Medicine

## 2020-02-01 DIAGNOSIS — M19042 Primary osteoarthritis, left hand: Secondary | ICD-10-CM

## 2020-02-01 DIAGNOSIS — M19041 Primary osteoarthritis, right hand: Secondary | ICD-10-CM

## 2020-02-17 ENCOUNTER — Encounter: Payer: Self-pay | Admitting: Family Medicine

## 2020-02-17 ENCOUNTER — Ambulatory Visit (INDEPENDENT_AMBULATORY_CARE_PROVIDER_SITE_OTHER): Payer: Medicare Other | Admitting: Family Medicine

## 2020-02-17 VITALS — BP 128/76 | HR 58 | Ht 68.0 in | Wt 190.0 lb

## 2020-02-17 DIAGNOSIS — Z6828 Body mass index (BMI) 28.0-28.9, adult: Secondary | ICD-10-CM | POA: Diagnosis not present

## 2020-02-17 DIAGNOSIS — R7301 Impaired fasting glucose: Secondary | ICD-10-CM | POA: Diagnosis not present

## 2020-02-17 DIAGNOSIS — I10 Essential (primary) hypertension: Secondary | ICD-10-CM

## 2020-02-17 LAB — POCT GLYCOSYLATED HEMOGLOBIN (HGB A1C): Hemoglobin A1C: 5.5 % (ref 4.0–5.6)

## 2020-02-17 NOTE — Assessment & Plan Note (Signed)
Well controlled. Continue current regimen. Follow up in  6 months.  

## 2020-02-17 NOTE — Progress Notes (Signed)
Established Patient Office Visit  Subjective:  Patient ID: Frederick Rush, male    DOB: 01-06-1951  Age: 69 y.o. MRN: [id number]  CC: No chief complaint on file.   HPI   [name] presents for   Hypertension- Pt denies chest pain, SOB, dizziness, or heart palpitations.  Taking meds as directed w/o problems.  Denies medication side effects.  He is actually planning on following up with Dr. Alvester Chou, his cardiologist in the next 2 months.  Impaired fasting glucose-no increased thirst or urination. No symptoms consistent with hypoglycemia.  We did discuss that he is gained about 20 pounds since his retirement he used to walk between 3 to 7 miles a day he says he is active doing yard work daily but has been on a little bit of weight.  Ports that he has had his Covid vaccines as well as booster.  And recently got his flu shot at [facility].  Follows with Dr. Kenton Kingfisher at my eye doctor in [place].  He also has appointment scheduled with Dr. Ardeen Fillers with GI in January for his 10-year recall for his colonoscopy.  Past Medical History:  Diagnosis Date  . Arthritis   . CAD (coronary artery disease), non obstructive by cath 07/19/11, treat medically 07/19/2011  . Crohn disease ([place])   . Hypercholesterolemia   . Hyperlipidemia   . Myocardial infarction ([place])   . Skin cancer 2006   Left shoulder; followed every six months.      Past Surgical History:  Procedure Laterality Date  . APPENDECTOMY    . CARDIAC CATHETERIZATION  07/15/2011   Medical management  . ELBOW SURGERY Right   . FRACTURE SURGERY Right 2015   elbow  . HERNIA REPAIR    . KNEE SURGERY     arthroscopy  . LEFT HEART CATHETERIZATION WITH CORONARY ANGIOGRAM  07/19/2011   Procedure: LEFT HEART CATHETERIZATION WITH CORONARY ANGIOGRAM;  Surgeon: Leonie Man, MD;  Location: [facility] CATH LAB;  Service: Cardiovascular;;  . TONSILLECTOMY    . TOTAL KNEE ARTHROPLASTY Left 12/31/2018   Procedure: TOTAL KNEE  ARTHROPLASTY;  Surgeon: Gaynelle Arabian, MD;  Location: [facility] ORS;  Service: Orthopedics;  Laterality: Left;  42mn  . VASECTOMY      Family History  Problem Relation Age of Onset  . Cancer Mother        Breast cancer; leukemia  . Heart disease Mother 748      CABG    Social History   Socioeconomic History  . Marital status: Married    Spouse name: [name] . Number of children: 2  . Years of education: Associates  . Highest education level: Not on file  Occupational History  . Occupation: BULK DRIVER    Employer: [employer]  Tobacco Use  . Smoking status: Former Smoker    Quit date: 07/16/1998    Years since quitting: 21.6  . Smokeless tobacco: Never Used  Vaping Use  . Vaping Use: Never used  Substance and Sexual Activity  . Alcohol use: Yes    Alcohol/week: 8.0 standard drinks    Types: 8 Cans of beer per week  . Drug use: No  . Sexual activity: Yes    Partners: Female    Birth control/protection: None, Post-menopausal  Other Topics Concern  . 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      Lives: Lives with his wife and their pets.      Tobacco:  None; quit in 2000; smoked x 15 years + 20 years = 35 years.      Alcohol:    2-4 beers per day      Exercise: job physically demanding; mows yard.      Seatbelt: 100%; no texting   Social Determinants of Radio broadcast assistant Strain: Not on file  Food Insecurity: Not on file  Transportation Needs: Not on file  Physical Activity: Not on file  Stress: Not on file  Social Connections: Not on file  Intimate Partner Violence: Not on file    Outpatient Medications Prior to Visit  Medication Sig Dispense Refill  . amLODipine (NORVASC) 5 MG tablet TAKE 1 TABLET(5 MG) BY MOUTH DAILY 90 tablet 3  . fluticasone (FLONASE) 50 MCG/ACT nasal spray  Place 1 spray into both nostrils daily.     Marland Kitchen lisinopril (ZESTRIL) 20 MG tablet Take 1 tablet (20 mg total) by mouth daily. 90 tablet 3  . meloxicam (MOBIC) 15 MG tablet TAKE 1 TABLET(15 MG) BY MOUTH DAILY 90 tablet 0  . mesalamine (APRISO) 0.375 g 24 hr capsule Take 4 capsules (1.5 g total) by mouth every morning. 1537m=4 capsules (Patient taking differently: Take 1,500 mg by mouth daily at 2 am. 15068m4 capsules) 360 capsule 1  . rosuvastatin (CRESTOR) 40 MG tablet Take 1 tablet (40 mg total) by mouth daily. 90 tablet 3  . predniSONE (DELTASONE) 20 MG tablet Take 2 tablets (40 mg total) by mouth daily with breakfast. 10 tablet 0   No facility-administered medications prior to visit.    Allergies  Allergen Reactions  . Latex     Pt stated that latex causes itching.  . Gabapentin Rash    ROS Review of Systems    Objective:    Physical Exam Constitutional:      Appearance: He is well-developed and well-nourished.  HENT:     Head: Normocephalic and atraumatic.  Cardiovascular:     Rate and Rhythm: Normal rate and regular rhythm.     Heart sounds: Normal heart sounds.  Pulmonary:     Effort: Pulmonary effort is normal.     Breath sounds: Normal breath sounds.  Skin:    General: Skin is warm and dry.  Neurological:     Mental Status: He is alert and oriented to person, place, and time.  Psychiatric:        Mood and Affect: Mood and affect normal.        Behavior: Behavior normal.     BP 128/76   Pulse (!) 58   Ht 5' 8"  (1.727 m)   Wt 190 lb (86.2 kg)   SpO2 100%   BMI 28.89 kg/m  Wt Readings from Last 3 Encounters:  02/17/20 190 lb (86.2 kg)  08/16/19 187 lb (84.8 kg)  04/09/19 185 lb (83.9 kg)     Health Maintenance Due  Topic Date Due  . COLONOSCOPY (Pts [id number] coverage will need to be confirmed)  12/23/2019    There are no preventive care reminders to display for this patient.  Lab Results  Component Value Date   TSH 4.890 (H) 06/19/2018    Lab Results  Component Value Date   WBC 11.0 (H) 01/01/2019   HGB 10.8 (L) 01/01/2019   HCT 33.5 (  L) 01/01/2019   MCV 95.2 01/01/2019   PLT 260 01/01/2019   Lab Results  Component Value Date   NA 136 08/19/2019   K 4.1 08/19/2019   CO2 27 08/19/2019   GLUCOSE 110 (H) 08/19/2019   BUN 27 (H) 08/19/2019   CREATININE 0.93 08/19/2019   BILITOT 0.4 08/19/2019   ALKPHOS 35 (L) 12/24/2018   AST 22 08/19/2019   ALT 21 08/19/2019   PROT 6.4 08/19/2019   ALBUMIN 4.5 12/24/2018   CALCIUM 9.5 08/19/2019   ANIONGAP 7 01/01/2019   Lab Results  Component Value Date   CHOL 116 08/19/2019   Lab Results  Component Value Date   HDL 46 08/19/2019   Lab Results  Component Value Date   LDLCALC 56 08/19/2019   Lab Results  Component Value Date   TRIG 65 08/19/2019   Lab Results  Component Value Date   CHOLHDL 2.5 08/19/2019   Lab Results  Component Value Date   HGBA1C 5.5 02/17/2020      Assessment & Plan:   Problem List Items Addressed This Visit      Cardiovascular and Mediastinum   Essential hypertension    Well controlled. Continue current regimen. Follow up in  6 months.        Relevant Orders   COMPLETE METABOLIC PANEL WITH GFR   Lipid panel     Endocrine   IFG (impaired fasting glucose) - Primary    Well controlled. Continue current regimen. Follow up in 6 months.        Relevant Orders   POCT glycosylated hemoglobin (Hb A1C) (Completed)   COMPLETE METABOLIC PANEL WITH GFR   Lipid panel    Other Visit Diagnoses    BMI 28.0-28.9,adult          Overweight/BMI 28 - discussed doing continuing to keep an eye on his weight working on increasing his exercise level and diet measures to keep his blood pressure under good control  No orders of the defined types were placed in this encounter.   Follow-up: Return in about 6 months (around 08/17/2020) for Hypertension.    Beatrice Lecher, MD

## 2020-02-18 LAB — COMPLETE METABOLIC PANEL WITH GFR
AG Ratio: 2 (calc) (ref 1.0–2.5)
ALT: 30 U/L (ref 9–46)
AST: 28 U/L (ref 10–35)
Albumin: 4.3 g/dL (ref 3.6–5.1)
Alkaline phosphatase (APISO): 38 U/L (ref 35–144)
BUN: 15 mg/dL (ref 7–25)
CO2: 28 mmol/L (ref 20–32)
Calcium: 9.4 mg/dL (ref 8.6–10.3)
Chloride: 104 mmol/L (ref 98–110)
Creat: 0.9 mg/dL (ref 0.70–1.18)
GFR, Est African American: 99 mL/min/{1.73_m2} (ref >=60)
GFR, Est Non African American: 86 mL/min/{1.73_m2} (ref >=60)
Globulin: 2.2 g/dL (calc) (ref 1.9–3.7)
Glucose, Bld: 85 mg/dL (ref 65–139)
Potassium: 4.1 mmol/L (ref 3.5–5.3)
Sodium: 139 mmol/L (ref 135–146)
Total Bilirubin: 0.3 mg/dL (ref 0.2–1.2)
Total Protein: 6.5 g/dL (ref 6.1–8.1)

## 2020-02-18 LAB — LIPID PANEL
Cholesterol: 134 mg/dL (ref <200)
HDL: 56 mg/dL (ref >=40)
LDL Cholesterol (Calc): 55 mg/dL (calc)
Non-HDL Cholesterol (Calc): 78 mg/dL (calc) (ref <130)
Total CHOL/HDL Ratio: 2.4 (calc) (ref <5.0)
Triglycerides: 152 mg/dL — ABNORMAL HIGH (ref <150)

## 2020-02-20 ENCOUNTER — Telehealth: Payer: Self-pay | Admitting: Family Medicine

## 2020-02-20 NOTE — Telephone Encounter (Signed)
Patient called and left a VM wanting to speak to someone about his lab results. Call patient back at [phone]

## 2020-02-24 NOTE — Telephone Encounter (Signed)
Spoke w/pt last wk regarding his results.

## 2020-03-02 DIAGNOSIS — L4050 Arthropathic psoriasis, unspecified: Secondary | ICD-10-CM | POA: Diagnosis not present

## 2020-03-02 DIAGNOSIS — K5010 Crohn's disease of large intestine without complications: Secondary | ICD-10-CM | POA: Diagnosis not present

## 2020-03-02 DIAGNOSIS — Z1211 Encounter for screening for malignant neoplasm of colon: Secondary | ICD-10-CM | POA: Diagnosis not present

## 2020-03-02 DIAGNOSIS — Z79899 Other long term (current) drug therapy: Secondary | ICD-10-CM | POA: Diagnosis not present

## 2020-03-23 ENCOUNTER — Other Ambulatory Visit: Payer: Self-pay | Admitting: Cardiovascular Disease

## 2020-03-26 DIAGNOSIS — R21 Rash and other nonspecific skin eruption: Secondary | ICD-10-CM | POA: Diagnosis not present

## 2020-03-26 DIAGNOSIS — M5386 Other specified dorsopathies, lumbar region: Secondary | ICD-10-CM | POA: Diagnosis not present

## 2020-03-26 DIAGNOSIS — Z7189 Other specified counseling: Secondary | ICD-10-CM | POA: Diagnosis not present

## 2020-03-26 DIAGNOSIS — M2550 Pain in unspecified joint: Secondary | ICD-10-CM | POA: Diagnosis not present

## 2020-03-26 DIAGNOSIS — M150 Primary generalized (osteo)arthritis: Secondary | ICD-10-CM | POA: Diagnosis not present

## 2020-03-26 DIAGNOSIS — M1712 Unilateral primary osteoarthritis, left knee: Secondary | ICD-10-CM | POA: Diagnosis not present

## 2020-03-26 DIAGNOSIS — K5090 Crohn's disease, unspecified, without complications: Secondary | ICD-10-CM | POA: Diagnosis not present

## 2020-03-26 DIAGNOSIS — R3912 Poor urinary stream: Secondary | ICD-10-CM | POA: Diagnosis not present

## 2020-03-26 DIAGNOSIS — M0760 Enteropathic arthropathies, unspecified site: Secondary | ICD-10-CM | POA: Diagnosis not present

## 2020-04-15 ENCOUNTER — Other Ambulatory Visit: Payer: Self-pay

## 2020-04-15 ENCOUNTER — Ambulatory Visit: Payer: Medicare Other | Admitting: Cardiovascular Disease

## 2020-04-15 ENCOUNTER — Encounter: Payer: Self-pay | Admitting: Cardiovascular Disease

## 2020-04-15 DIAGNOSIS — E785 Hyperlipidemia, unspecified: Secondary | ICD-10-CM

## 2020-04-15 DIAGNOSIS — I2510 Atherosclerotic heart disease of native coronary artery without angina pectoris: Secondary | ICD-10-CM

## 2020-04-15 DIAGNOSIS — I10 Essential (primary) hypertension: Secondary | ICD-10-CM

## 2020-04-15 NOTE — Patient Instructions (Signed)
Medication Instructions:  Your physician recommends that you continue on your current medications as directed. Please refer to the Current Medication list given to you today.  *If you need a refill on your cardiac medications before your next appointment, please call your pharmacy*   Follow-Up: At [facility], you and your health needs are our priority.  As part of our continuing mission to provide you with exceptional heart care, we have created designated Provider Care Teams.  These Care Teams include your primary Cardiologist (physician) and Advanced Practice Providers (APPs -  Physician Assistants and Nurse Practitioners) who all work together to provide you with the care you need, when you need it.  We recommend signing up for the patient portal called "MyChart".  Sign up information is provided on this After Visit Summary.  MyChart is used to connect with patients for Virtual Visits (Telemedicine).  Patients are able to view lab/test results, encounter notes, upcoming appointments, etc.  Non-urgent messages can be sent to your provider as well.   To learn more about what you can do with MyChart, go to [email or web address].    Your next appointment:   12 month(s)  The format for your next appointment:   In Person  Provider:   Quay Burow, MD

## 2020-04-15 NOTE — Assessment & Plan Note (Signed)
History of dyslipidemia on rosuvastatin with lipid profile performed 02/17/2020 revealing total pressure 134, LDL 55 and HDL 56.

## 2020-04-15 NOTE — Progress Notes (Signed)
04/15/2020 Frederick Rush   May 30, 1950  [id number]  Primary Physician Hali Marry, MD Primary Cardiologist: Lorretta Harp MD Garret Reddish, [place], [place]  HPI:  Frederick Rush is a 70 y.o.  mildly overweight, married, Caucasian male, father of two, grandfather to two grandchildren, who I last saw in the office  04/09/2019 has a history of noncritical CAD found on cath performed by Dr. Ula Lingo on Jul 15, 2011. His other problems include hyperlipidemia and remote tobacco abuse.  He had a uncomplicated left total knee replacement on 12/31/2018 by Dr. Ricki Rodriguez which he is rehabilitated from.    Since I saw him a year ago he continues to do well.  He denies chest pain or shortness of breath.   Current Meds  Medication Sig  . amLODipine (NORVASC) 5 MG tablet TAKE 1 TABLET(5 MG) BY MOUTH DAILY  . fluticasone (FLONASE) 50 MCG/ACT nasal spray Place 1 spray into both nostrils daily.   Marland Kitchen lisinopril (ZESTRIL) 20 MG tablet TAKE 1 TABLET(20 MG) BY MOUTH DAILY  . meloxicam (MOBIC) 15 MG tablet TAKE 1 TABLET(15 MG) BY MOUTH DAILY  . mesalamine (APRISO) 0.375 g 24 hr capsule Take 4 capsules (1.5 g total) by mouth every morning. 1532m=4 capsules (Patient taking differently: Take 1,500 mg by mouth daily at 2 am. 15073m4 capsules)  . rosuvastatin (CRESTOR) 40 MG tablet Take 1 tablet (40 mg total) by mouth daily.     Allergies  Allergen Reactions  . Latex     Pt stated that latex causes itching.  . Gabapentin Rash    Social History   Socioeconomic History  . Marital status: Married    Spouse name: RoShirlean Mylar. Number of children: 2  . Years of education: Associates  . Highest education level: Not on file  Occupational History  . Occupation: BULK DRIVER    Employer: [employer]  Tobacco Use  . Smoking status: Former Smoker    Quit date: 07/16/1998    Years since quitting: 21.7  . Smokeless tobacco: Never Used  Vaping Use  . Vaping Use: Never used   Substance and Sexual Activity  . Alcohol use: Yes    Alcohol/week: 8.0 standard drinks    Types: 8 Cans of beer per week  . Drug use: No  . Sexual activity: Yes    Partners: Female    Birth control/protection: None, Post-menopausal  Other Topics Concern  . 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      Lives: Lives with his wife and their pets.      Tobacco:  None; quit in 2000; smoked x 15 years + 20 years = 35 years.      Alcohol:    2-4 beers per day      Exercise: job physically demanding; mows yard.      Seatbelt: 100%; no texting   Social Determinants of HeRadio broadcast assistanttrain: Not on file  Food Insecurity: Not on file  Transportation Needs: Not on file  Physical Activity: Not on file  Stress: Not on file  Social Connections: Not on file  Intimate Partner Violence: Not on file     Review of Systems: General: negative for chills, fever, night sweats  or weight changes.  Cardiovascular: negative for chest pain, dyspnea on exertion, edema, orthopnea, palpitations, paroxysmal nocturnal dyspnea or shortness of breath Dermatological: negative for rash Respiratory: negative for cough or wheezing Urologic: negative for hematuria Abdominal: negative for nausea, vomiting, diarrhea, bright red blood per rectum, melena, or hematemesis Neurologic: negative for visual changes, syncope, or dizziness All other systems reviewed and are otherwise negative except as noted above.    Blood pressure 139/73, pulse (!) 51, height 5' 8"  (1.727 m), weight 182 lb (82.6 kg), SpO2 97 %.  General appearance: alert and no distress Neck: no adenopathy, no carotid bruit, no JVD, supple, symmetrical, trachea midline and thyroid not enlarged, symmetric, no tenderness/mass/nodules Lungs: clear to auscultation  bilaterally Heart: regular rate and rhythm, S1, S2 normal, no murmur, click, rub or gallop Extremities: extremities normal, atraumatic, no cyanosis or edema Pulses: 2+ and symmetric Skin: Skin color, texture, turgor normal. No rashes or lesions Neurologic: Alert and oriented X 3, normal strength and tone. Normal symmetric reflexes. Normal coordination and gait  EKG sinus bradycardia at 51 without ST or T wave changes.  Personally reviewed this EKG.  ASSESSMENT AND PLAN:   Dyslipidemia History of dyslipidemia on rosuvastatin with lipid profile performed 02/17/2020 revealing total pressure 134, LDL 55 and HDL 56.  CAD (coronary artery disease), non obstructive by cath 07/19/11, treat medically History of nonobstructive CAD by cath 07/19/2011 performed Dr. Ellyn Hack.  He denies chest pain or shortness of breath.  Essential hypertension History of essential hypertension blood pressure measured today 139/73.  He is on amlodipine and lisinopril.      Lorretta Harp MD FACP,FACC,FAHA, [facility] 04/15/2020 3:01 PM

## 2020-04-15 NOTE — Assessment & Plan Note (Signed)
History of essential hypertension blood pressure measured today 139/73.  He is on amlodipine and lisinopril.

## 2020-04-15 NOTE — Assessment & Plan Note (Signed)
History of nonobstructive CAD by cath 07/19/2011 performed Dr. Ellyn Hack.  He denies chest pain or shortness of breath.

## 2020-05-02 ENCOUNTER — Other Ambulatory Visit: Payer: Self-pay | Admitting: Family Medicine

## 2020-05-02 DIAGNOSIS — M19041 Primary osteoarthritis, right hand: Secondary | ICD-10-CM

## 2020-05-06 DIAGNOSIS — D123 Benign neoplasm of transverse colon: Secondary | ICD-10-CM | POA: Diagnosis not present

## 2020-05-06 DIAGNOSIS — Z1211 Encounter for screening for malignant neoplasm of colon: Secondary | ICD-10-CM | POA: Diagnosis not present

## 2020-05-06 DIAGNOSIS — K635 Polyp of colon: Secondary | ICD-10-CM | POA: Diagnosis not present

## 2020-05-06 DIAGNOSIS — D125 Benign neoplasm of sigmoid colon: Secondary | ICD-10-CM | POA: Diagnosis not present

## 2020-05-06 LAB — HM COLONOSCOPY

## 2020-05-12 ENCOUNTER — Encounter: Payer: Self-pay | Admitting: Family Medicine

## 2020-05-15 ENCOUNTER — Other Ambulatory Visit: Payer: Self-pay | Admitting: Cardiovascular Disease

## 2020-06-01 ENCOUNTER — Ambulatory Visit: Payer: Medicare Other | Attending: Internal Medicine

## 2020-06-01 DIAGNOSIS — Z23 Encounter for immunization: Secondary | ICD-10-CM

## 2020-06-01 NOTE — Progress Notes (Signed)
   Covid-19 Vaccination Clinic  Name:  Maui Britten    MRN: [id number] DOB: 1950-10-22  06/01/2020  Mr. Senteno was observed post Covid-19 immunization for 15 minutes without incident. He was provided with Vaccine Information Sheet and instruction to access the V-Safe system.   Mr. Hyder was instructed to call 911 with any severe reactions post vaccine: Marland Kitchen Difficulty breathing  . Swelling of face and throat  . A fast heartbeat  . A bad rash all over body  . Dizziness and weakness   Immunizations Administered    Name Date Dose VIS Date Route   PFIZER Comrnaty(Gray TOP) Covid-19 Vaccine 06/01/2020  2:25 PM 0.3 mL 01/30/2020 Intramuscular   Manufacturer: [place]   Lot: [id number]   NDC: [phone]

## 2020-06-03 ENCOUNTER — Encounter: Payer: Self-pay | Admitting: Family Medicine

## 2020-06-03 DIAGNOSIS — H02834 Dermatochalasis of left upper eyelid: Secondary | ICD-10-CM | POA: Diagnosis not present

## 2020-06-03 DIAGNOSIS — H02831 Dermatochalasis of right upper eyelid: Secondary | ICD-10-CM | POA: Diagnosis not present

## 2020-06-03 DIAGNOSIS — H25813 Combined forms of age-related cataract, bilateral: Secondary | ICD-10-CM | POA: Diagnosis not present

## 2020-06-03 DIAGNOSIS — H527 Unspecified disorder of refraction: Secondary | ICD-10-CM | POA: Diagnosis not present

## 2020-06-03 DIAGNOSIS — H52203 Unspecified astigmatism, bilateral: Secondary | ICD-10-CM | POA: Diagnosis not present

## 2020-06-03 LAB — HM DIABETES EYE EXAM

## 2020-06-05 ENCOUNTER — Other Ambulatory Visit (HOSPITAL_BASED_OUTPATIENT_CLINIC_OR_DEPARTMENT_OTHER): Payer: Self-pay

## 2020-06-05 MED ORDER — PFIZER-BIONT COVID-19 VAC-TRIS 30 MCG/0.3ML IM SUSP
INTRAMUSCULAR | 0 refills | Status: DC
  Filled 2020-06-05: qty 0.3, 1d supply, fill #0

## 2020-07-06 DIAGNOSIS — L820 Inflamed seborrheic keratosis: Secondary | ICD-10-CM | POA: Diagnosis not present

## 2020-07-06 DIAGNOSIS — L2089 Other atopic dermatitis: Secondary | ICD-10-CM | POA: Diagnosis not present

## 2020-07-06 DIAGNOSIS — L570 Actinic keratosis: Secondary | ICD-10-CM | POA: Diagnosis not present

## 2020-07-06 DIAGNOSIS — D225 Melanocytic nevi of trunk: Secondary | ICD-10-CM | POA: Diagnosis not present

## 2020-07-06 DIAGNOSIS — L578 Other skin changes due to chronic exposure to nonionizing radiation: Secondary | ICD-10-CM | POA: Diagnosis not present

## 2020-07-31 ENCOUNTER — Other Ambulatory Visit: Payer: Self-pay | Admitting: Family Medicine

## 2020-07-31 DIAGNOSIS — H25813 Combined forms of age-related cataract, bilateral: Secondary | ICD-10-CM | POA: Diagnosis not present

## 2020-07-31 DIAGNOSIS — M19041 Primary osteoarthritis, right hand: Secondary | ICD-10-CM

## 2020-07-31 DIAGNOSIS — H52203 Unspecified astigmatism, bilateral: Secondary | ICD-10-CM | POA: Diagnosis not present

## 2020-08-06 DIAGNOSIS — Z87891 Personal history of nicotine dependence: Secondary | ICD-10-CM | POA: Diagnosis not present

## 2020-08-06 DIAGNOSIS — H25813 Combined forms of age-related cataract, bilateral: Secondary | ICD-10-CM | POA: Diagnosis not present

## 2020-08-06 DIAGNOSIS — M1990 Unspecified osteoarthritis, unspecified site: Secondary | ICD-10-CM | POA: Diagnosis not present

## 2020-08-06 DIAGNOSIS — H02834 Dermatochalasis of left upper eyelid: Secondary | ICD-10-CM | POA: Diagnosis not present

## 2020-08-06 DIAGNOSIS — I10 Essential (primary) hypertension: Secondary | ICD-10-CM | POA: Diagnosis not present

## 2020-08-06 DIAGNOSIS — Z79899 Other long term (current) drug therapy: Secondary | ICD-10-CM | POA: Diagnosis not present

## 2020-08-06 DIAGNOSIS — Z9089 Acquired absence of other organs: Secondary | ICD-10-CM | POA: Diagnosis not present

## 2020-08-06 DIAGNOSIS — H278 Other specified disorders of lens: Secondary | ICD-10-CM | POA: Diagnosis not present

## 2020-08-06 DIAGNOSIS — H52203 Unspecified astigmatism, bilateral: Secondary | ICD-10-CM | POA: Diagnosis not present

## 2020-08-06 DIAGNOSIS — H25812 Combined forms of age-related cataract, left eye: Secondary | ICD-10-CM | POA: Diagnosis not present

## 2020-08-06 DIAGNOSIS — H02831 Dermatochalasis of right upper eyelid: Secondary | ICD-10-CM | POA: Diagnosis not present

## 2020-08-06 DIAGNOSIS — E785 Hyperlipidemia, unspecified: Secondary | ICD-10-CM | POA: Diagnosis not present

## 2020-08-06 DIAGNOSIS — K5090 Crohn's disease, unspecified, without complications: Secondary | ICD-10-CM | POA: Diagnosis not present

## 2020-08-07 DIAGNOSIS — Z961 Presence of intraocular lens: Secondary | ICD-10-CM | POA: Diagnosis not present

## 2020-08-17 ENCOUNTER — Ambulatory Visit (INDEPENDENT_AMBULATORY_CARE_PROVIDER_SITE_OTHER): Payer: Medicare Other | Admitting: Family Medicine

## 2020-08-17 ENCOUNTER — Other Ambulatory Visit: Payer: Self-pay

## 2020-08-17 ENCOUNTER — Encounter: Payer: Self-pay | Admitting: Family Medicine

## 2020-08-17 VITALS — BP 113/64 | HR 53 | Ht 68.0 in | Wt 185.0 lb

## 2020-08-17 DIAGNOSIS — K5000 Crohn's disease of small intestine without complications: Secondary | ICD-10-CM

## 2020-08-17 DIAGNOSIS — M19041 Primary osteoarthritis, right hand: Secondary | ICD-10-CM

## 2020-08-17 DIAGNOSIS — M19042 Primary osteoarthritis, left hand: Secondary | ICD-10-CM

## 2020-08-17 DIAGNOSIS — I2510 Atherosclerotic heart disease of native coronary artery without angina pectoris: Secondary | ICD-10-CM

## 2020-08-17 DIAGNOSIS — R7301 Impaired fasting glucose: Secondary | ICD-10-CM

## 2020-08-17 DIAGNOSIS — I10 Essential (primary) hypertension: Secondary | ICD-10-CM | POA: Diagnosis not present

## 2020-08-17 DIAGNOSIS — Z125 Encounter for screening for malignant neoplasm of prostate: Secondary | ICD-10-CM

## 2020-08-17 DIAGNOSIS — M359 Systemic involvement of connective tissue, unspecified: Secondary | ICD-10-CM

## 2020-08-17 LAB — POCT GLYCOSYLATED HEMOGLOBIN (HGB A1C): Hemoglobin A1C: 5.4 % (ref 4.0–5.6)

## 2020-08-17 MED ORDER — ROSUVASTATIN CALCIUM 40 MG PO TABS: 40.0000 mg | ORAL_TABLET | Freq: Every day | ORAL | 3 refills | Status: DC

## 2020-08-17 MED ORDER — MELOXICAM 15 MG PO TABS: 15.0000 mg | ORAL_TABLET | Freq: Every day | ORAL | 1 refills | Status: DC | PRN

## 2020-08-17 NOTE — Assessment & Plan Note (Signed)
Well controlled. Continue current regimen. Follow up in  6 mo  

## 2020-08-17 NOTE — Assessment & Plan Note (Signed)
He would like a refill on his NSAID.  He does help control his pain even though he does have a history of underlying CAD.  Blood pressure is currently well controlled.

## 2020-08-17 NOTE — Progress Notes (Signed)
Established Patient Office Visit  Subjective:  Patient ID: Frederick Rush, male    DOB: 1950-09-28  Age: 70 y.o. MRN: [id number]  CC:  Chief Complaint  Patient presents with   Hypertension    HPI Frederick Rush presents for 6 mo f/u   Hypertension- Pt denies chest pain, SOB, dizziness, or heart palpitations.  Taking meds as directed w/o problems.  Denies medication side effects.    F/U CAD - no recnet CP or SOB  Impaired fasting glucose-no increased thirst or urination. No symptoms consistent with hypoglycemia.  F/U Chron's disease -actually recently had an endoscopy and [facility] in [facility] this [facility] Ardeen Fillers.  He had cataract surgery on his left eye since I last saw him.  Past Medical History:  Diagnosis Date   Arthritis    CAD (coronary artery disease), non obstructive by cath 07/19/11, treat medically 07/19/2011   Crohn disease ([place])    Hypercholesterolemia    Hyperlipidemia    Myocardial infarction [facility])    Skin cancer 2006   Left shoulder; followed every six months.      Past Surgical History:  Procedure Laterality Date   APPENDECTOMY     CARDIAC CATHETERIZATION  07/15/2011   Medical management   ELBOW SURGERY Right    FRACTURE SURGERY Right 2015   elbow   HERNIA REPAIR     KNEE SURGERY     arthroscopy   LEFT HEART CATHETERIZATION WITH CORONARY ANGIOGRAM  07/19/2011   Procedure: LEFT HEART CATHETERIZATION WITH CORONARY ANGIOGRAM;  Surgeon: Leonie Man, MD;  Location: [facility] CATH LAB;  Service: Cardiovascular;;   TONSILLECTOMY     TOTAL KNEE ARTHROPLASTY Left 12/31/2018   Procedure: TOTAL KNEE ARTHROPLASTY;  Surgeon: Gaynelle Arabian, MD;  Location: [facility] ORS;  Service: Orthopedics;  Laterality: Left;  90mn   VASECTOMY      Family History  Problem Relation Age of Onset   Cancer Mother        Breast cancer; leukemia   Heart disease Mother 747      CABG    Social History   Socioeconomic History   Marital  status: Married    Spouse name: [name]  Number of children: 2   Years of education: Associates   Highest education level: Not on file  Occupational History   Occupation: BULK DRIVER    Employer: [employer]  Tobacco Use   Smoking status: Former    Pack years: 0.00    Types: Cigarettes    Quit date: 07/16/1998    Years since quitting: 22.1   Smokeless tobacco: Never  Vaping Use   Vaping Use: Never used  Substance and Sexual Activity   Alcohol use: Yes    Alcohol/week: 8.0 standard drinks    Types: 8 Cans of beer per week   Drug use: No   Sexual activity: Yes    Partners: Female    Birth control/protection: None, Post-menopausal  Other Topics Concern   Not on file  Social History Narrative   Marital status: married x 39 years      Children:  2 children; 3 grandchildren; no gg      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      Lives: Lives with his wife and their pets.      Tobacco:  None; quit in 2000; smoked x 15 years + 20 years = 35  years.      Alcohol:    2-4 beers per day      Exercise: job physically demanding; mows yard.      Seatbelt: 100%; no texting   Social Determinants of Radio broadcast assistant Strain: Not on file  Food Insecurity: Not on file  Transportation Needs: Not on file  Physical Activity: Not on file  Stress: Not on file  Social Connections: Not on file  Intimate Partner Violence: Not on file    Outpatient Medications Prior to Visit  Medication Sig Dispense Refill   amLODipine (NORVASC) 5 MG tablet TAKE 1 TABLET(5 MG) BY MOUTH DAILY 90 tablet 3   fluticasone (FLONASE) 50 MCG/ACT nasal spray Place 1 spray into both nostrils daily.      lisinopril (ZESTRIL) 20 MG tablet TAKE 1 TABLET(20 MG) BY MOUTH DAILY 90 tablet 3   mesalamine (APRISO) 0.375 g 24 hr capsule Take 4 capsules (1.5 g total) by mouth every morning. 1514m=4 capsules (Patient taking differently: Take 1,500 mg by mouth daily at 2 am.  15018m4 capsules) 360 capsule 1   COVID-19 mRNA Vac-TriS, Pfizer, (PFIZER-BIONT COVID-19 VAC-TRIS) SUSP injection Inject into the muscle. 0.3 mL 0   COVID-19 mRNA vaccine, Pfizer, 30 MCG/0.3ML injection INJECT AS DIRECTED .3 mL 0   meloxicam (MOBIC) 15 MG tablet Take 1 tablet (15 mg total) by mouth daily. Needs appointment. 30 tablet 0   rosuvastatin (CRESTOR) 40 MG tablet Take 1 tablet (40 mg total) by mouth daily. 90 tablet 3   No facility-administered medications prior to visit.    Allergies  Allergen Reactions   Latex     Pt stated that latex causes itching.   Gabapentin Rash    ROS Review of Systems    Objective:    Physical Exam  BP 113/64   Pulse (!) 53   Ht 5' 8"  (1.727 m)   Wt 185 lb (83.9 kg)   SpO2 98%   BMI 28.13 kg/m  Wt Readings from Last 3 Encounters:  08/17/20 185 lb (83.9 kg)  04/15/20 182 lb (82.6 kg)  02/17/20 190 lb (86.2 kg)     There are no preventive care reminders to display for this patient.  There are no preventive care reminders to display for this patient.  Lab Results  Component Value Date   TSH 4.890 (H) 06/19/2018   Lab Results  Component Value Date   WBC 11.0 (H) 01/01/2019   HGB 10.8 (L) 01/01/2019   HCT 33.5 (L) 01/01/2019   MCV 95.2 01/01/2019   PLT 260 01/01/2019   Lab Results  Component Value Date   NA 139 02/17/2020   K 4.1 02/17/2020   CO2 28 02/17/2020   GLUCOSE 85 02/17/2020   BUN 15 02/17/2020   CREATININE 0.90 02/17/2020   BILITOT 0.3 02/17/2020   ALKPHOS 35 (L) 12/24/2018   AST 28 02/17/2020   ALT 30 02/17/2020   PROT 6.5 02/17/2020   ALBUMIN 4.5 12/24/2018   CALCIUM 9.4 02/17/2020   ANIONGAP 7 01/01/2019   Lab Results  Component Value Date   CHOL 134 02/17/2020   Lab Results  Component Value Date   HDL 56 02/17/2020   Lab Results  Component Value Date   LDLCALC 55 02/17/2020   Lab Results  Component Value Date   TRIG 152 (H) 02/17/2020   Lab Results  Component Value Date   CHOLHDL  2.4 02/17/2020   Lab Results  Component Value Date   HGBA1C 5.4 08/17/2020  Assessment & Plan:   Problem List Items Addressed This Visit       Cardiovascular and Mediastinum   CAD (coronary artery disease), non obstructive by cath 07/19/11, treat medically - Primary (Chronic)    Lipids are up-to-date last LDL was 55 we will refill medication for 1 year.       Relevant Medications   rosuvastatin (CRESTOR) 40 MG tablet   Other Relevant Orders   BASIC METABOLIC PANEL WITH GFR   Essential hypertension    Well controlled. Continue current regimen. Follow up in  6 mo        Relevant Medications   rosuvastatin (CRESTOR) 40 MG tablet   Other Relevant Orders   BASIC METABOLIC PANEL WITH GFR     Digestive   Crohn disease ([place]) (Chronic)     Endocrine   IFG (impaired fasting glucose)    Well controlled. Continue current regimen. Follow up in  6-12 mo        Relevant Orders   POCT glycosylated hemoglobin (Hb A1C) (Completed)     Musculoskeletal and Integument   Osteoarthritis of both hands    He would like a refill on his NSAID.  He does help control his pain even though he does have a history of underlying CAD.  Blood pressure is currently well controlled.       Relevant Medications   meloxicam (MOBIC) 15 MG tablet     Other   Diffuse connective tissue disease (HCC)    Stable.         Other Visit Diagnoses     Screening for prostate cancer       Relevant Orders   PSA       Meds ordered this encounter  Medications   meloxicam (MOBIC) 15 MG tablet    Sig: Take 1 tablet (15 mg total) by mouth daily as needed for pain.    Dispense:  90 tablet    Refill:  1   rosuvastatin (CRESTOR) 40 MG tablet    Sig: Take 1 tablet (40 mg total) by mouth daily.    Dispense:  90 tablet    Refill:  3    Follow-up: Return in about 6 months (around 02/16/2021) for Wellness Exam.    Beatrice Lecher, MD

## 2020-08-17 NOTE — Assessment & Plan Note (Signed)
Well controlled. Continue current regimen. Follow up in  6-12 mo

## 2020-08-17 NOTE — Assessment & Plan Note (Signed)
Stable

## 2020-08-17 NOTE — Assessment & Plan Note (Signed)
Lipids are up-to-date last LDL was 55 we will refill medication for 1 year.

## 2020-08-18 LAB — BASIC METABOLIC PANEL WITH GFR
BUN: 13 mg/dL (ref 7–25)
CO2: 29 mmol/L (ref 20–32)
Calcium: 9.3 mg/dL (ref 8.6–10.3)
Chloride: 99 mmol/L (ref 98–110)
Creat: 0.94 mg/dL (ref 0.70–1.18)
GFR, Est African American: 94 mL/min/{1.73_m2} (ref >=60)
GFR, Est Non African American: 81 mL/min/{1.73_m2} (ref >=60)
Glucose, Bld: 76 mg/dL (ref 65–99)
Potassium: 4.2 mmol/L (ref 3.5–5.3)
Sodium: 136 mmol/L (ref 135–146)

## 2020-08-18 LAB — PSA: PSA: 0.27 ng/mL (ref <=4.00)

## 2020-08-18 NOTE — Progress Notes (Signed)
All labs are normal. 

## 2020-08-28 ENCOUNTER — Other Ambulatory Visit: Payer: Self-pay | Admitting: Family Medicine

## 2020-08-28 DIAGNOSIS — M19041 Primary osteoarthritis, right hand: Secondary | ICD-10-CM

## 2020-08-28 DIAGNOSIS — M19042 Primary osteoarthritis, left hand: Secondary | ICD-10-CM

## 2020-09-22 ENCOUNTER — Other Ambulatory Visit: Payer: Self-pay

## 2020-09-22 MED ORDER — LISINOPRIL 20 MG PO TABS: ORAL_TABLET | ORAL | 3 refills | Status: DC

## 2020-10-16 ENCOUNTER — Other Ambulatory Visit: Payer: Self-pay

## 2020-10-16 ENCOUNTER — Encounter: Payer: Self-pay | Admitting: Family Medicine

## 2020-10-16 ENCOUNTER — Ambulatory Visit (INDEPENDENT_AMBULATORY_CARE_PROVIDER_SITE_OTHER): Payer: Medicare Other | Admitting: Family Medicine

## 2020-10-16 VITALS — BP 131/63 | HR 59 | Temp 98.6°F | Ht 68.0 in | Wt 186.0 lb

## 2020-10-16 DIAGNOSIS — D171 Benign lipomatous neoplasm of skin and subcutaneous tissue of trunk: Secondary | ICD-10-CM

## 2020-10-16 NOTE — Progress Notes (Signed)
Acute Office Visit  Subjective:    Patient ID: Frederick Rush, male    DOB: 10-20-1950, 70 y.o.   MRN: [id number]  Chief Complaint  Patient presents with   Cyst    HPI Patient is in today for knot on back. He noticed it about a month ago. Says it was painful and then asked his wife to check the area.  Noticed  knot. It continue to be tender with any pressue on it.    Past Medical History:  Diagnosis Date   Arthritis    CAD (coronary artery disease), non obstructive by cath 07/19/11, treat medically 07/19/2011   Crohn disease ([place])    Hypercholesterolemia    Hyperlipidemia    Myocardial infarction [facility])    Skin cancer 2006   Left shoulder; followed every six months.      Past Surgical History:  Procedure Laterality Date   APPENDECTOMY     CARDIAC CATHETERIZATION  07/15/2011   Medical management   ELBOW SURGERY Right    FRACTURE SURGERY Right 2015   elbow   HERNIA REPAIR     KNEE SURGERY     arthroscopy   LEFT HEART CATHETERIZATION WITH CORONARY ANGIOGRAM  07/19/2011   Procedure: LEFT HEART CATHETERIZATION WITH CORONARY ANGIOGRAM;  Surgeon: Leonie Man, MD;  Location: [facility] CATH LAB;  Service: Cardiovascular;;   TONSILLECTOMY     TOTAL KNEE ARTHROPLASTY Left 12/31/2018   Procedure: TOTAL KNEE ARTHROPLASTY;  Surgeon: Gaynelle Arabian, MD;  Location: [facility] ORS;  Service: Orthopedics;  Laterality: Left;  21mn   VASECTOMY      Family History  Problem Relation Age of Onset   Cancer Mother        Breast cancer; leukemia   Heart disease Mother 736      CABG    Social History   Socioeconomic History   Marital status: Married    Spouse name: [name]  Number of children: 2   Years of education: Associates   Highest education level: Not on file  Occupational History   Occupation: BULK DRIVER    Employer: 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      Lives: Lives with his wife and their pets.      Tobacco:  None; quit in 2000; smoked x 15 years + 20 years = 35 years.      Alcohol:    2-4 beers per day      Exercise: job physically demanding; mows yard.      Seatbelt: 100%; no texting   Social Determinants of HRadio broadcast assistantStrain: Not on file  Food Insecurity: Not on file  Transportation Needs:  Not on file  Physical Activity: Not on file  Stress: Not on file  Social Connections: Not on file  Intimate Partner Violence: Not on file    Outpatient Medications Prior to Visit  Medication Sig Dispense Refill   amLODipine (NORVASC) 5 MG tablet TAKE 1 TABLET(5 MG) BY MOUTH DAILY 90 tablet 3   fluticasone (FLONASE) 50 MCG/ACT nasal spray Place 1 spray into both nostrils daily.      lisinopril (ZESTRIL) 20 MG tablet TAKE 1 TABLET(20 MG) BY MOUTH DAILY 90 tablet 3   meloxicam (MOBIC) 15 MG tablet Take 1 tablet (15 mg total) by mouth daily as needed for pain. 90 tablet 1   mesalamine (APRISO) 0.375 g 24 hr capsule Take 4 capsules (1.5 g total) by mouth every morning. 1525m=4 capsules (Patient taking differently: Take 1,500 mg by mouth daily at 2 am. 15039m4 capsules) 360 capsule 1   rosuvastatin (CRESTOR) 40 MG tablet Take 1 tablet (40 mg total) by mouth daily. 90 tablet 3   No facility-administered medications prior to visit.    Allergies  Allergen Reactions   Latex     Pt stated that latex causes itching.    Gabapentin Rash    Review of Systems     Objective:    Physical Exam Vitals reviewed.  Constitutional:      Appearance: He is well-developed.  HENT:     Head: Normocephalic and atraumatic.  Eyes:     Conjunctiva/sclera: Conjunctivae normal.  Cardiovascular:     Rate and Rhythm: Normal rate.  Pulmonary:     Effort: Pulmonary effort is normal.  Musculoskeletal:     Comments: Approx 5-6 cm over right upper shoulder blade area. Non tender on exam today.   Skin:    General: Skin is dry.     Coloration: Skin is not pale.  Neurological:     Mental Status: He is alert and oriented to person, place, and time.  Psychiatric:        Behavior: Behavior normal.    BP 131/63   Pulse (!) 59   Temp 98.6 F (37 C) (Oral)   Ht 5' 8"  (1.727 m)   Wt 186 lb (84.4 kg)   SpO2 98% Comment: on RA  BMI 28.28 kg/m  Wt Readings from Last 3 Encounters:  10/16/20 186 lb (84.4 kg)  08/17/20 185 lb (83.9 kg)  04/15/20 182 lb (82.6 kg)    There are no preventive care reminders to display for this patient.   There are no preventive care reminders to display for this patient.   Lab Results  Component Value Date   TSH 4.890 (H) 06/19/2018   Lab Results  Component Value Date   WBC 11.0 (H) 01/01/2019   HGB 10.8 (L) 01/01/2019   HCT 33.5 (L) 01/01/2019   MCV 95.2 01/01/2019   PLT 260 01/01/2019   Lab Results  Component Value Date   NA 136 08/17/2020   K 4.2 08/17/2020   CO2 29 08/17/2020   GLUCOSE 76 08/17/2020   BUN 13 08/17/2020   CREATININE 0.94 08/17/2020   BILITOT 0.3 02/17/2020   ALKPHOS 35 (L) 12/24/2018   AST 28 02/17/2020   ALT 30 02/17/2020   PROT 6.5 02/17/2020   ALBUMIN 4.5 12/24/2018   CALCIUM 9.3 08/17/2020   ANIONGAP 7 01/01/2019   Lab Results  Component Value Date   CHOL 134 02/17/2020   Lab Results  Component Value Date   HDL 56 02/17/2020   Lab Results  Component Value Date   LDLCALC 55 02/17/2020   Lab Results  Component Value Date   TRIG 152  (H) 02/17/2020   Lab Results  Component Value Date   CHOLHDL 2.4 02/17/2020   Lab Results  Component Value Date   HGBA1C 5.4 08/17/2020       Assessment & Plan:   Problem List Items Addressed This Visit   None Visit Diagnoses     Lipoma of torso    -  Primary   Relevant Orders   [place] MiscellaneoUS Localization     Lesion most consistent with a lipoma.  Gave reassurance.  Because its been painful and he is worried work and go him before it with ultrasound just to confirm diagnosis.  We discussed these are typically benign lesions I suspect just the pressure of sitting and leaning back and lying in bed is probably what is causing it stay irritated he could consider removal if he would like but certainly does not have to.  We did discuss though that they can get larger over time.   No orders of the defined types were placed in this encounter.    Beatrice Lecher, MD

## 2020-10-20 ENCOUNTER — Other Ambulatory Visit: Payer: Self-pay

## 2020-10-20 ENCOUNTER — Other Ambulatory Visit: Payer: Self-pay | Admitting: Family Medicine

## 2020-10-20 ENCOUNTER — Ambulatory Visit (INDEPENDENT_AMBULATORY_CARE_PROVIDER_SITE_OTHER): Payer: Medicare Other

## 2020-10-20 DIAGNOSIS — D171 Benign lipomatous neoplasm of skin and subcutaneous tissue of trunk: Secondary | ICD-10-CM | POA: Diagnosis not present

## 2020-10-21 DIAGNOSIS — K5090 Crohn's disease, unspecified, without complications: Secondary | ICD-10-CM | POA: Diagnosis not present

## 2020-10-21 DIAGNOSIS — Z79899 Other long term (current) drug therapy: Secondary | ICD-10-CM | POA: Diagnosis not present

## 2020-10-21 DIAGNOSIS — M47899 Other spondylosis, site unspecified: Secondary | ICD-10-CM | POA: Diagnosis not present

## 2020-10-21 DIAGNOSIS — M150 Primary generalized (osteo)arthritis: Secondary | ICD-10-CM | POA: Diagnosis not present

## 2020-10-21 DIAGNOSIS — M2550 Pain in unspecified joint: Secondary | ICD-10-CM | POA: Diagnosis not present

## 2020-10-23 NOTE — Progress Notes (Signed)
Hi Lanny Hurst, ultrasound looks good it rules out any type of cyst.  It is consistent with a lipoma.  Again if at any point you would like to have it removed you can but it certainly at your convenience.  You can also continue to monitor it over time if you would like.

## 2020-11-02 ENCOUNTER — Other Ambulatory Visit: Payer: Self-pay | Admitting: Cardiovascular Disease

## 2020-11-09 ENCOUNTER — Ambulatory Visit: Payer: Medicare Other | Attending: Internal Medicine

## 2020-11-09 ENCOUNTER — Telehealth: Payer: Self-pay | Admitting: Lab

## 2020-11-09 DIAGNOSIS — Z23 Encounter for immunization: Secondary | ICD-10-CM

## 2020-11-09 NOTE — Progress Notes (Signed)
  Chronic Care Management   Outreach Note  11/09/2020 Name: Frederick Rush MRN: [id number] DOB: 05/18/1950  Referred by: Hali Marry, MD Reason for referral : Medication Management   A second unsuccessful telephone outreach was attempted today. The patient was referred to pharmacist for assistance with care management and care coordination.  Follow Up Plan:  [place]

## 2020-11-09 NOTE — Progress Notes (Signed)
   Covid-19 Vaccination Clinic  Name:  Dyllen Menning    MRN: [id number] DOB: 1950-09-13  11/09/2020  Mr. Bohlman was observed post Covid-19 immunization for 15 minutes without incident. He was provided with Vaccine Information Sheet and instruction to access the V-Safe system.   Mr. Sandate was instructed to call 911 with any severe reactions post vaccine: Difficulty breathing  Swelling of face and throat  A fast heartbeat  A bad rash all over body  Dizziness and weakness

## 2020-11-16 ENCOUNTER — Other Ambulatory Visit (HOSPITAL_BASED_OUTPATIENT_CLINIC_OR_DEPARTMENT_OTHER): Payer: Self-pay

## 2020-11-16 DIAGNOSIS — D171 Benign lipomatous neoplasm of skin and subcutaneous tissue of trunk: Secondary | ICD-10-CM | POA: Diagnosis not present

## 2020-11-16 MED ORDER — COVID-19MRNA BIVAL VACC PFIZER 30 MCG/0.3ML IM SUSP
INTRAMUSCULAR | 0 refills | Status: DC
  Filled 2020-11-16: qty 0.3, 1d supply, fill #0

## 2021-02-16 ENCOUNTER — Encounter: Payer: Self-pay | Admitting: Family Medicine

## 2021-02-16 ENCOUNTER — Other Ambulatory Visit: Payer: Self-pay

## 2021-02-16 ENCOUNTER — Ambulatory Visit (INDEPENDENT_AMBULATORY_CARE_PROVIDER_SITE_OTHER): Payer: Medicare Other | Admitting: Family Medicine

## 2021-02-16 VITALS — BP 137/79 | HR 57 | Temp 97.6°F | Resp 16 | Ht 67.0 in | Wt 188.0 lb

## 2021-02-16 DIAGNOSIS — Z0000 Encounter for general adult medical examination without abnormal findings: Secondary | ICD-10-CM

## 2021-02-16 DIAGNOSIS — M25512 Pain in left shoulder: Secondary | ICD-10-CM | POA: Diagnosis not present

## 2021-02-16 DIAGNOSIS — E785 Hyperlipidemia, unspecified: Secondary | ICD-10-CM | POA: Diagnosis not present

## 2021-02-16 DIAGNOSIS — W19XXXA Unspecified fall, initial encounter: Secondary | ICD-10-CM | POA: Diagnosis not present

## 2021-02-16 NOTE — Progress Notes (Signed)
Established Patient Office Visit  Subjective:  Patient ID: Frederick Rush, male    DOB: 05-08-1950  Age: 70 y.o. MRN: [id number]  CC:  Chief Complaint  Patient presents with   Annual Exam    HPI Frederick Rush presents for CPE.  He is doing well overall.  He has not been nearly as physically active since he is retired.  So he has gained a little bit of weight.  He also wanted to let me know his been having problems with his left shoulder he had fallen several times and hit but this last time he has had difficulty lifting it above 90 degrees he says some days he can do it without significant difficulty and other days it is really hard and painful to lift past 90 degrees.  He would really like to go see the orthopedist for it since its been going on for a while.  He reports that he had his colonoscopy done in March with Dr. Shary Key.   Past Medical History:  Diagnosis Date   Arthritis    CAD (coronary artery disease), non obstructive by cath 07/19/11, treat medically 07/19/2011   Crohn disease ([place])    Hypercholesterolemia    Hyperlipidemia    Myocardial infarction [facility])    Skin cancer 2006   Left shoulder; followed every six months.      Past Surgical History:  Procedure Laterality Date   APPENDECTOMY     CARDIAC CATHETERIZATION  07/15/2011   Medical management   ELBOW SURGERY Right    FRACTURE SURGERY Right 2015   elbow   HERNIA REPAIR     KNEE SURGERY     arthroscopy   LEFT HEART CATHETERIZATION WITH CORONARY ANGIOGRAM  07/19/2011   Procedure: LEFT HEART CATHETERIZATION WITH CORONARY ANGIOGRAM;  Surgeon: Leonie Man, MD;  Location: [facility] CATH LAB;  Service: Cardiovascular;;   TONSILLECTOMY     TOTAL KNEE ARTHROPLASTY Left 12/31/2018   Procedure: TOTAL KNEE ARTHROPLASTY;  Surgeon: Gaynelle Arabian, MD;  Location: [facility] ORS;  Service: Orthopedics;  Laterality: Left;  25mn   VASECTOMY      Family History  Problem Relation Age of Onset   Cancer Mother        Breast  cancer; leukemia   Heart disease Mother 728      CABG   Hypertension Brother     Social History   Socioeconomic History   Marital status: Married    Spouse name: [name]  Number of children: 2   Years of education: Associates   Highest education level: Not on file  Occupational History   Occupation: BULK DRIVER    Employer: [employer]    Comment: Retired   Occupation: Freight Liner-Part time  Tobacco Use   Smoking status: Former    Types: Cigarettes    Quit date: 07/16/1998    Years since quitting: 22.6   Smokeless tobacco: Never  Vaping Use   Vaping Use: Never used  Substance and Sexual Activity   Alcohol use: Yes    Alcohol/week: 5.0 standard drinks    Types: 5 Cans of beer per week   Drug use: No   Sexual activity: Yes    Partners: Female    Birth control/protection: None, Post-menopausal  Other Topics Concern   Not on file  Social History Narrative   Marital status: married x 39 years      Children:  2 children; 3 grandchildren; no gg      Employment:  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 Currently working part time for [employer]       Lives: Lives with his wife and their pets.      Tobacco:  None; quit in 2000; smoked x 15 years + 20 years = 35 years.      Alcohol:    5 beers a week       Exercise: mows yard.      Seatbelt: 100%; no texting   Social Determinants of Radio broadcast assistant Strain: Not on file  Food Insecurity: Not on file  Transportation Needs: Not on file  Physical Activity: Not on file  Stress: Not on file  Social Connections: Not on file  Intimate Partner Violence: Not on file    Outpatient Medications Prior to Visit  Medication Sig Dispense Refill   amLODipine (NORVASC) 5 MG tablet TAKE 1 TABLET(5 MG) BY MOUTH DAILY 90 tablet 3   fluticasone (FLONASE) 50 MCG/ACT nasal spray Place 1 spray into both nostrils daily.      lisinopril (ZESTRIL) 20 MG tablet TAKE 1 TABLET(20 MG) BY  MOUTH DAILY 90 tablet 3   meloxicam (MOBIC) 15 MG tablet Take 1 tablet (15 mg total) by mouth daily as needed for pain. 90 tablet 1   mesalamine (APRISO) 0.375 g 24 hr capsule Take 4 capsules (1.5 g total) by mouth every morning. 1525m=4 capsules (Patient taking differently: Take 1,500 mg by mouth daily at 2 am. 15025m4 capsules) 360 capsule 1   rosuvastatin (CRESTOR) 40 MG tablet Take 1 tablet (40 mg total) by mouth daily. 90 tablet 3   COVID-19 mRNA bivalent vaccine, Pfizer, injection Inject into the muscle. 0.3 mL 0   No facility-administered medications prior to visit.    Allergies  Allergen Reactions   Latex     Pt stated that latex causes itching.   Gabapentin Rash    ROS Review of Systems    Objective:    Physical Exam Constitutional:      Appearance: He is well-developed.  HENT:     Head: Normocephalic and atraumatic.     Right Ear: External ear normal.     Left Ear: External ear normal.     Nose: Nose normal.  Eyes:     Conjunctiva/sclera: Conjunctivae normal.     Pupils: Pupils are equal, round, and reactive to light.  Neck:     Thyroid: No thyromegaly.  Cardiovascular:     Rate and Rhythm: Normal rate and regular rhythm.     Heart sounds: Normal heart sounds.  Pulmonary:     Effort: Pulmonary effort is normal.     Breath sounds: Normal breath sounds.  Abdominal:     General: Bowel sounds are normal. There is no distension.     Palpations: Abdomen is soft. There is no mass.     Tenderness: There is no abdominal tenderness. There is no guarding or rebound.  Musculoskeletal:        General: Normal range of motion.     Cervical back: Normal range of motion and neck supple.     Comments: He is able to extend his left shoulder pass 90 degree but with some difficulty   Lymphadenopathy:     Cervical: No cervical adenopathy.  Skin:    General: Skin is warm and dry.  Neurological:     Mental Status: He is alert and oriented to person, place, and time.  Deep Tendon Reflexes: Reflexes are normal and symmetric.  Psychiatric:        Behavior: Behavior normal.        Thought Content: Thought content normal.        Judgment: Judgment normal.    BP 137/79    Pulse (!) 57    Temp 97.6 F (36.4 C)    Resp 16    Ht 5' 7"  (1.702 m)    Wt 188 lb (85.3 kg)    SpO2 97%    BMI 29.44 kg/m  Wt Readings from Last 3 Encounters:  02/16/21 188 lb (85.3 kg)  10/16/20 186 lb (84.4 kg)  08/17/20 185 lb (83.9 kg)     There are no preventive care reminders to display for this patient.  There are no preventive care reminders to display for this patient.  Lab Results  Component Value Date   TSH 4.890 (H) 06/19/2018   Lab Results  Component Value Date   WBC 11.0 (H) 01/01/2019   HGB 10.8 (L) 01/01/2019   HCT 33.5 (L) 01/01/2019   MCV 95.2 01/01/2019   PLT 260 01/01/2019   Lab Results  Component Value Date   NA 136 08/17/2020   K 4.2 08/17/2020   CO2 29 08/17/2020   GLUCOSE 76 08/17/2020   BUN 13 08/17/2020   CREATININE 0.94 08/17/2020   BILITOT 0.3 02/17/2020   ALKPHOS 35 (L) 12/24/2018   AST 28 02/17/2020   ALT 30 02/17/2020   PROT 6.5 02/17/2020   ALBUMIN 4.5 12/24/2018   CALCIUM 9.3 08/17/2020   ANIONGAP 7 01/01/2019   Lab Results  Component Value Date   CHOL 134 02/17/2020   Lab Results  Component Value Date   HDL 56 02/17/2020   Lab Results  Component Value Date   LDLCALC 55 02/17/2020   Lab Results  Component Value Date   TRIG 152 (H) 02/17/2020   Lab Results  Component Value Date   CHOLHDL 2.4 02/17/2020   Lab Results  Component Value Date   HGBA1C 5.4 08/17/2020      Assessment & Plan:   Problem List Items Addressed This Visit   None Visit Diagnoses     Wellness examination    -  Primary   Relevant Orders   Lipid Panel w/reflex Direct LDL   COMPLETE METABOLIC PANEL WITH GFR   Acute pain of left shoulder       Relevant Orders   Ambulatory referral to Orthopedic Surgery   Fall, initial encounter        Relevant Orders   Ambulatory referral to Orthopedic Surgery     Keep up a regular exercise program and make sure you are eating a healthy diet Try to eat 4 servings of dairy a day, or if you are lactose intolerant take a calcium with vitamin D daily.  Your vaccines are up to date.  Due for labs.   Colonoscopy is up-to-date.  Shoulder pain-we will go ahead and place referral to the orthopedist.  No orders of the defined types were placed in this encounter.   Follow-up: Return in about 6 months (around 08/17/2021) for Hypertension.    Beatrice Lecher, MD

## 2021-02-17 LAB — COMPLETE METABOLIC PANEL WITH GFR
AG Ratio: 1.6 (calc) (ref 1.0–2.5)
ALT: 17 U/L (ref 9–46)
AST: 19 U/L (ref 10–35)
Albumin: 4.2 g/dL (ref 3.6–5.1)
Alkaline phosphatase (APISO): 28 U/L — ABNORMAL LOW (ref 35–144)
BUN: 15 mg/dL (ref 7–25)
CO2: 30 mmol/L (ref 20–32)
Calcium: 9 mg/dL (ref 8.6–10.3)
Chloride: 102 mmol/L (ref 98–110)
Creat: 0.92 mg/dL (ref 0.70–1.28)
Globulin: 2.6 g/dL (calc) (ref 1.9–3.7)
Glucose, Bld: 80 mg/dL (ref 65–99)
Potassium: 4.1 mmol/L (ref 3.5–5.3)
Sodium: 139 mmol/L (ref 135–146)
Total Bilirubin: 0.4 mg/dL (ref 0.2–1.2)
Total Protein: 6.8 g/dL (ref 6.1–8.1)
eGFR: 88 mL/min/{1.73_m2} (ref >=60)

## 2021-02-17 LAB — LIPID PANEL W/REFLEX DIRECT LDL
Cholesterol: 118 mg/dL (ref <200)
HDL: 46 mg/dL (ref >=40)
LDL Cholesterol (Calc): 53 mg/dL (calc)
Non-HDL Cholesterol (Calc): 72 mg/dL (calc) (ref <130)
Total CHOL/HDL Ratio: 2.6 (calc) (ref <5.0)
Triglycerides: 104 mg/dL (ref <150)

## 2021-02-17 NOTE — Progress Notes (Signed)
Your lab work is within acceptable range and there are no concerning findings.   ?

## 2021-02-25 ENCOUNTER — Other Ambulatory Visit: Payer: Self-pay | Admitting: *Deleted

## 2021-02-25 DIAGNOSIS — M19041 Primary osteoarthritis, right hand: Secondary | ICD-10-CM

## 2021-02-25 MED ORDER — MELOXICAM 15 MG PO TABS: 15.0000 mg | ORAL_TABLET | Freq: Every day | ORAL | 1 refills | Status: DC | PRN

## 2021-03-04 DIAGNOSIS — M25512 Pain in left shoulder: Secondary | ICD-10-CM | POA: Diagnosis not present

## 2021-03-16 DIAGNOSIS — Z79899 Other long term (current) drug therapy: Secondary | ICD-10-CM | POA: Diagnosis not present

## 2021-03-16 DIAGNOSIS — L4050 Arthropathic psoriasis, unspecified: Secondary | ICD-10-CM | POA: Diagnosis not present

## 2021-03-16 DIAGNOSIS — K5010 Crohn's disease of large intestine without complications: Secondary | ICD-10-CM | POA: Diagnosis not present

## 2021-04-01 DIAGNOSIS — R3912 Poor urinary stream: Secondary | ICD-10-CM | POA: Diagnosis not present

## 2021-04-02 DIAGNOSIS — K5010 Crohn's disease of large intestine without complications: Secondary | ICD-10-CM | POA: Diagnosis not present

## 2021-04-13 ENCOUNTER — Ambulatory Visit: Payer: Medicare Other | Admitting: Cardiovascular Disease

## 2021-04-13 ENCOUNTER — Other Ambulatory Visit: Payer: Self-pay

## 2021-04-13 ENCOUNTER — Encounter: Payer: Self-pay | Admitting: Cardiovascular Disease

## 2021-04-13 DIAGNOSIS — I2510 Atherosclerotic heart disease of native coronary artery without angina pectoris: Secondary | ICD-10-CM | POA: Diagnosis not present

## 2021-04-13 DIAGNOSIS — Z87891 Personal history of nicotine dependence: Secondary | ICD-10-CM

## 2021-04-13 DIAGNOSIS — E785 Hyperlipidemia, unspecified: Secondary | ICD-10-CM

## 2021-04-13 MED ORDER — AMLODIPINE BESYLATE 5 MG PO TABS: 5.0000 mg | ORAL_TABLET | Freq: Every day | ORAL | 4 refills | Status: DC

## 2021-04-13 MED ORDER — ROSUVASTATIN CALCIUM 40 MG PO TABS: 40.0000 mg | ORAL_TABLET | Freq: Every day | ORAL | 4 refills | Status: DC

## 2021-04-13 MED ORDER — LISINOPRIL 20 MG PO TABS: 20.0000 mg | ORAL_TABLET | Freq: Every day | ORAL | 4 refills | Status: DC

## 2021-04-13 NOTE — Patient Instructions (Signed)
Medication Instructions:  Your physician recommends that you continue on your current medications as directed. Please refer to the Current Medication list given to you today.  *If you need a refill on your cardiac medications before your next appointment, please call your pharmacy*   Follow-Up: At [facility], you and your health needs are our priority.  As part of our continuing mission to provide you with exceptional heart care, we have created designated Provider Care Teams.  These Care Teams include your primary Cardiologist (physician) and Advanced Practice Providers (APPs -  Physician Assistants and Nurse Practitioners) who all work together to provide you with the care you need, when you need it.  We recommend signing up for the patient portal called "MyChart".  Sign up information is provided on this After Visit Summary.  MyChart is used to connect with patients for Virtual Visits (Telemedicine).  Patients are able to view lab/test results, encounter notes, upcoming appointments, etc.  Non-urgent messages can be sent to your provider as well.   To learn more about what you can do with MyChart, go to [email or web address].    Your next appointment:   12 month(s)  The format for your next appointment:   In Person  Provider:   Quay Burow, MD

## 2021-04-13 NOTE — Assessment & Plan Note (Signed)
Remote history of tobacco abuse having quit 02/20/1998.

## 2021-04-13 NOTE — Assessment & Plan Note (Signed)
History of nonobstructive CAD by cath performed by Dr. Ellyn Hack 07/15/2011.  Patient denies chest pain.  He is chronically short of breath because of COPD.

## 2021-04-13 NOTE — Assessment & Plan Note (Signed)
History of dyslipidemia on statin therapy with lipid profile performed 02/16/2021 revealing total cholesterol 118, LDL 53 and HDL 46.

## 2021-04-13 NOTE — Progress Notes (Signed)
04/13/2021 Bassem Bernasconi   07/01/1950  [id number]  Primary Physician Hali Marry, MD Primary Cardiologist: Lorretta Harp MD Garret Reddish, [place], [place]  HPI:  Frederick Rush is a 71 y.o.  mildly overweight, married, Caucasian male, father of two, grandfather to two grandchildren, who I last saw in the office 05/13/2020 has a history of noncritical CAD found on cath performed by Dr. Ula Lingo on Jul 15, 2011. His other problems include hyperlipidemia and remote tobacco abuse.    He had a uncomplicated left total knee replacement on 12/31/2018 by Dr. Ricki Rodriguez which he is rehabilitated from.     Since I saw him a year ago he continues to do well.  He denies chest pain or shortness of breath.   Current Meds  Medication Sig   amLODipine (NORVASC) 5 MG tablet TAKE 1 TABLET(5 MG) BY MOUTH DAILY   fluticasone (FLONASE) 50 MCG/ACT nasal spray Place 1 spray into both nostrils daily.    lisinopril (ZESTRIL) 20 MG tablet TAKE 1 TABLET(20 MG) BY MOUTH DAILY   meloxicam (MOBIC) 15 MG tablet Take 1 tablet (15 mg total) by mouth daily as needed for pain.   mesalamine (APRISO) 0.375 g 24 hr capsule Take 4 capsules (1.5 g total) by mouth every morning. 1558m=4 capsules (Patient taking differently: Take 1,500 mg by mouth daily at 2 am. 15065m4 capsules)   rosuvastatin (CRESTOR) 40 MG tablet Take 1 tablet (40 mg total) by mouth daily.     Allergies  Allergen Reactions   Latex     Pt stated that latex causes itching.   Gabapentin Rash    Social History   Socioeconomic History   Marital status: Married    Spouse name: RoShirlean Mylar Number of children: 2   Years of education: Associates   Highest education level: Not on file  Occupational History   Occupation: BULK DRIVER    Employer: [employer]    Comment: Retired   Occupation: Freight Liner-Part time  Tobacco Use   Smoking status: Former    Types: Cigarettes    Quit date: 07/16/1998    Years since quitting:  22.7   Smokeless tobacco: Never  Vaping Use   Vaping Use: Never used  Substance and Sexual Activity   Alcohol use: Yes    Alcohol/week: 5.0 standard drinks    Types: 5 Cans of beer per week   Drug use: No   Sexual activity: Yes    Partners: Female    Birth control/protection: None, Post-menopausal  Other Topics Concern   Not on file  Social History Narrative   Marital status: married x 39 years      Children:  2 children; 3 grandchildren; no gg      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      Tobacco:  None; quit in 2000; smoked x 15 years + 20 years = 35 years.      Alcohol:    5 beers a week       Exercise: mows yard.      Seatbelt: 100%; no texting   Social Determinants of Health   Financial Resource Strain: Not on file  Food Insecurity: Not on file  Transportation Needs: Not on file  Physical Activity: Not on file  Stress: Not on  file  Social Connections: Not on file  Intimate Partner Violence: Not on file     Review of Systems: General: negative for chills, fever, night sweats or weight changes.  Cardiovascular: negative for chest pain, dyspnea on exertion, edema, orthopnea, palpitations, paroxysmal nocturnal dyspnea or shortness of breath Dermatological: negative for rash Respiratory: negative for cough or wheezing Urologic: negative for hematuria Abdominal: negative for nausea, vomiting, diarrhea, bright red blood per rectum, melena, or hematemesis Neurologic: negative for visual changes, syncope, or dizziness All other systems reviewed and are otherwise negative except as noted above.    Blood pressure 124/72, pulse 66, height 5' 8"  (1.727 m), weight 186 lb (84.4 kg).  General appearance: alert and no distress Neck: no adenopathy, no carotid bruit, no JVD, supple, symmetrical, trachea midline, and thyroid not enlarged,  symmetric, no tenderness/mass/nodules Lungs: clear to auscultation bilaterally Heart: regular rate and rhythm, S1, S2 normal, no murmur, click, rub or gallop Extremities: extremities normal, atraumatic, no cyanosis or edema Pulses: 2+ and symmetric Skin: Skin color, texture, turgor normal. No rashes or lesions Neurologic: Grossly normal  EKG sinus rhythm at 66 without ST or T wave changes.  I personally reviewed this EKG.  ASSESSMENT AND PLAN:   Dyslipidemia History of dyslipidemia on statin therapy with lipid profile performed 02/16/2021 revealing total cholesterol 118, LDL 53 and HDL 46.  History of smoking Remote history of tobacco abuse having quit 02/20/1998.  CAD (coronary artery disease), non obstructive by cath 07/19/11, treat medically History of nonobstructive CAD by cath performed by Dr. Ellyn Hack 07/15/2011.  Patient denies chest pain.  He is chronically short of breath because of COPD.     Lorretta Harp MD FACP,FACC,FAHA, [facility] 04/13/2021 2:00 PM

## 2021-04-13 NOTE — Addendum Note (Signed)
Addended by: Beatrix Fetters on: 04/13/2021 02:12 PM   Modules accepted: Orders

## 2021-05-01 ENCOUNTER — Other Ambulatory Visit: Payer: Self-pay | Admitting: Cardiovascular Disease

## 2021-05-03 DIAGNOSIS — M47899 Other spondylosis, site unspecified: Secondary | ICD-10-CM | POA: Diagnosis not present

## 2021-05-03 DIAGNOSIS — M1991 Primary osteoarthritis, unspecified site: Secondary | ICD-10-CM | POA: Diagnosis not present

## 2021-05-03 DIAGNOSIS — Z79899 Other long term (current) drug therapy: Secondary | ICD-10-CM | POA: Diagnosis not present

## 2021-05-03 DIAGNOSIS — K5090 Crohn's disease, unspecified, without complications: Secondary | ICD-10-CM | POA: Diagnosis not present

## 2021-05-20 ENCOUNTER — Encounter: Payer: Self-pay | Admitting: Family Medicine

## 2021-05-20 ENCOUNTER — Ambulatory Visit (INDEPENDENT_AMBULATORY_CARE_PROVIDER_SITE_OTHER): Payer: Medicare Other | Admitting: Family Medicine

## 2021-05-20 VITALS — BP 157/71 | HR 61 | Temp 97.9°F | Ht 68.0 in | Wt 191.0 lb

## 2021-05-20 DIAGNOSIS — J329 Chronic sinusitis, unspecified: Secondary | ICD-10-CM

## 2021-05-20 DIAGNOSIS — M47819 Spondylosis without myelopathy or radiculopathy, site unspecified: Secondary | ICD-10-CM | POA: Insufficient documentation

## 2021-05-20 DIAGNOSIS — M2550 Pain in unspecified joint: Secondary | ICD-10-CM | POA: Insufficient documentation

## 2021-05-20 DIAGNOSIS — J40 Bronchitis, not specified as acute or chronic: Secondary | ICD-10-CM

## 2021-05-20 DIAGNOSIS — M1991 Primary osteoarthritis, unspecified site: Secondary | ICD-10-CM | POA: Insufficient documentation

## 2021-05-20 MED ORDER — AMOXICILLIN-POT CLAVULANATE 875-125 MG PO TABS: 1.0000 | ORAL_TABLET | Freq: Two times a day (BID) | ORAL | 0 refills | Status: DC

## 2021-05-20 MED ORDER — PREDNISONE 20 MG PO TABS: 20.0000 mg | ORAL_TABLET | Freq: Two times a day (BID) | ORAL | 0 refills | Status: DC

## 2021-05-20 MED ORDER — ALBUTEROL SULFATE HFA 108 (90 BASE) MCG/ACT IN AERS: 2.0000 | INHALATION_SPRAY | Freq: Four times a day (QID) | RESPIRATORY_TRACT | 0 refills | Status: DC | PRN

## 2021-05-20 MED ORDER — PREDNISONE 20 MG PO TABS: 20.0000 mg | ORAL_TABLET | Freq: Two times a day (BID) | ORAL | 0 refills | Status: AC

## 2021-05-20 NOTE — Progress Notes (Signed)
? ?  Subjective:  ? ? Patient ID: Frederick Rush, male    DOB: 1951/01/07, 71 y.o.   MRN: [id number] ? ?URI  ?Associated symptoms include congestion, coughing, ear pain, rhinorrhea and sinus pain.  ?Frederick Rush is a 71 year old male presenting to the clinic to day with concerns of a URI. ? ?He states he had chest tightness that started on Friday. The tightness started on his right but is now bilateral. Since Friday he has also had a productive cough, ear pain, clear rhinorrhea, sinus pressure, and teeth pain. He states he has had episodes in the past that have presented similarly and has progressed to pneumonia.  ? ? ?Review of Systems  ?Constitutional:  Negative for chills and fever.  ?HENT:  Positive for congestion, ear pain, rhinorrhea, sinus pressure and sinus pain.   ?Respiratory:  Positive for cough and chest tightness. Negative for shortness of breath.   ? ?   ?Objective:  ? Physical Exam ?HENT:  ?   Right Ear: There is impacted cerumen.  ?   Left Ear: Tympanic membrane normal.  ?   Nose: Congestion present.  ?   Mouth/Throat:  ?   Mouth: Mucous membranes are dry.  ?   Pharynx: Posterior oropharyngeal erythema present.  ?Cardiovascular:  ?   Rate and Rhythm: Normal rate and regular rhythm.  ?Pulmonary:  ?   Breath sounds: Normal breath sounds. No wheezing or rhonchi.  ?Chest:  ?   Chest wall: No tenderness.  ? ?   ?Assessment & Plan:  ? ? ?Symptoms consistent with acute bronchitis based off of patient's presenting symptoms. Prescribing Augmentin today since it has been 6 days with continued worsening of symptoms and patient has a history of developing pneumonia during these episodes. Will also add Prednisone and an Albuterol inhaler to help ease symptoms. Call back to clinic if symptoms do not improve in 3 days or worsen.  ? ?

## 2021-05-20 NOTE — Patient Instructions (Signed)
Acute Bronchitis, Adult ?Acute bronchitis is when air tubes in the lungs (bronchi) suddenly get swollen. The condition can make it hard for you to breathe. In adults, acute bronchitis usually goes away within 2 weeks. A cough caused by bronchitis may last up to 3 weeks. Smoking, allergies, and asthma can make the condition worse. ?What are the causes? ?Germs that cause cold and flu (viruses). The most common cause of this condition is the virus that causes the common cold. ?Bacteria. ?Substances that bother (irritate) the lungs, including: ?Smoke from cigarettes and other types of tobacco. ?Dust and pollen. ?Fumes from chemicals, gases, or burned fuel. ?Indoor or outdoor air pollution. ?What increases the risk? ?A weak body's defense system. This is also called the immune system. ?Any condition that affects your lungs and breathing, such as asthma. ?What are the signs or symptoms? ?A cough. ?Coughing up clear, yellow, or green mucus. ?Making high-pitched whistling sounds when you breathe, most often when you breathe out (wheezing). ?Runny or stuffy nose. ?Having too much mucus in your lungs (chest congestion). ?Shortness of breath. ?Body aches. ?A sore throat. ?How is this treated? ?Acute bronchitis may go away over time without treatment. Your doctor may tell you to: ?Drink more fluids. This will help thin your mucus so it is easier to cough up. ?Use a device that gets medicine into your lungs (inhaler). ?Use a vaporizer or a humidifier. These are machines that add water to the air. This helps with coughing and poor breathing. ?Take a medicine that thins mucus and helps clear it from your lungs. ?Take a medicine that prevents or stops coughing. ?It is not common to take an antibiotic medicine for this condition. ?Follow these instructions at home: ? ?Take over-the-counter and prescription medicines only as told by your doctor. ?Use an inhaler, vaporizer, or humidifier as told by your doctor. ?Take two teaspoons (10  mL) of honey at bedtime. This helps lessen your coughing at night. ?Drink enough fluid to keep your pee (urine) pale yellow. ?Do not smoke or use any products that contain nicotine or tobacco. If you need help quitting, ask your doctor. ?Get a lot of rest. ?Return to your normal activities when your doctor says that it is safe. ?Keep all follow-up visits. ?How is this prevented? ? ?Wash your hands often with soap and water for at least 20 seconds. If you cannot use soap and water, use hand sanitizer. ?Avoid contact with people who have cold symptoms. ?Try not to touch your mouth, nose, or eyes with your hands. ?Avoid breathing in smoke or chemical fumes. ?Make sure to get the flu shot every year. ?Contact a doctor if: ?Your symptoms do not get better in 2 weeks. ?You have trouble coughing up the mucus. ?Your cough keeps you awake at night. ?You have a fever. ?Get help right away if: ?You cough up blood. ?You have chest pain. ?You have very bad shortness of breath. ?You faint or keep feeling like you are going to faint. ?You have a very bad headache. ?Your fever or chills get worse. ?These symptoms may be an emergency. Get help right away. Call your local emergency services (911 in the [place] ?Do not wait to see if the symptoms will go away. ?Do not drive yourself to the hospital. ?Summary ?Acute bronchitis is when air tubes in the lungs (bronchi) suddenly get swollen. In adults, acute bronchitis usually goes away within 2 weeks. ?Drink more fluids. This will help thin your mucus so it is easier to  cough up. ?Take over-the-counter and prescription medicines only as told by your doctor. ?Contact a doctor if your symptoms do not improve after 2 weeks of treatment. ?This information is not intended to replace advice given to you by your health care provider. Make sure you discuss any questions you have with your health care provider. ?Document Revised: 06/10/2020 Document Reviewed: 06/10/2020 ?Elsevier Patient Education  ? [place]. ? ?

## 2021-05-24 ENCOUNTER — Other Ambulatory Visit: Payer: Self-pay

## 2021-05-24 MED ORDER — ALBUTEROL SULFATE HFA 108 (90 BASE) MCG/ACT IN AERS: 2.0000 | INHALATION_SPRAY | Freq: Four times a day (QID) | RESPIRATORY_TRACT | 3 refills | Status: DC | PRN

## 2021-06-12 ENCOUNTER — Other Ambulatory Visit: Payer: Self-pay | Admitting: Family Medicine

## 2021-07-07 DIAGNOSIS — D225 Melanocytic nevi of trunk: Secondary | ICD-10-CM | POA: Diagnosis not present

## 2021-07-07 DIAGNOSIS — L570 Actinic keratosis: Secondary | ICD-10-CM | POA: Diagnosis not present

## 2021-07-07 DIAGNOSIS — Z85828 Personal history of other malignant neoplasm of skin: Secondary | ICD-10-CM | POA: Diagnosis not present

## 2021-07-07 DIAGNOSIS — L578 Other skin changes due to chronic exposure to nonionizing radiation: Secondary | ICD-10-CM | POA: Diagnosis not present

## 2021-07-09 DIAGNOSIS — D179 Benign lipomatous neoplasm, unspecified: Secondary | ICD-10-CM | POA: Diagnosis not present

## 2021-07-09 DIAGNOSIS — D171 Benign lipomatous neoplasm of skin and subcutaneous tissue of trunk: Secondary | ICD-10-CM | POA: Diagnosis not present

## 2021-07-26 ENCOUNTER — Telehealth: Payer: Medicare Other | Admitting: Physician Assistant

## 2021-07-26 DIAGNOSIS — U071 COVID-19: Secondary | ICD-10-CM

## 2021-07-26 MED ORDER — MOLNUPIRAVIR EUA 200MG CAPSULE: 4.0000 | ORAL_CAPSULE | Freq: Two times a day (BID) | ORAL | 0 refills | Status: AC

## 2021-07-26 NOTE — Progress Notes (Signed)
Virtual Visit Consent   Emmitte Surgeon, you are scheduled for a virtual visit with a [facility] provider today. Just as with appointments in the office, your consent must be obtained to participate. Your consent will be active for this visit and any virtual visit you may have with one of our providers in the next 365 days. If you have a MyChart account, a copy of this consent can be sent to you electronically.  As this is a virtual visit, video technology does not allow for your provider to perform a traditional examination. This may limit your provider's ability to fully assess your condition. If your provider identifies any concerns that need to be evaluated in person or the need to arrange testing (such as labs, EKG, etc.), we will make arrangements to do so. Although advances in technology are sophisticated, we cannot ensure that it will always work on either your end or our end. If the connection with a video visit is poor, the visit may have to be switched to a telephone visit. With either a video or telephone visit, we are not always able to ensure that we have a secure connection.  By engaging in this virtual visit, you consent to the provision of healthcare and authorize for your insurance to be billed (if applicable) for the services provided during this visit. Depending on your insurance coverage, you may receive a charge related to this service.  I need to obtain your verbal consent now. Are you willing to proceed with your visit today? Frederick Rush has provided verbal consent on 07/26/2021 for a virtual visit (video or telephone). Mar Daring, PA-C  Date: 07/26/2021 1:28 PM  Virtual Visit via Video Note   I, Mar Daring, connected with  Frederick Rush  ([id number], Dec 28, 1950) on 07/26/21 at  1:15 PM EDT by a video-enabled telemedicine application and verified that I am speaking with the correct person using two identifiers.  Location: Patient: Virtual Visit  Location Patient: Home Provider: Virtual Visit Location Provider: Home Office   I discussed the limitations of evaluation and management by telemedicine and the availability of in person appointments. The patient expressed understanding and agreed to proceed.    History of Present Illness: Frederick Rush is a 71 y.o. who identifies as a male who was assigned male at birth, and is being seen today for Covid 62.  HPI: URI  This is a new problem. The current episode started in the past 7 days (Saturday). The problem has been gradually worsening. Maximum temperature: 98.4, 99.7, 100.1. The fever has been present for 1 to 2 days. Associated symptoms include congestion, coughing, ear pain (hurts for glasses to sit on ears), headaches, joint pain, rhinorrhea, sinus pain and a sore throat (scratchy yesterday). Pertinent negatives include no chest pain, diarrhea, nausea, plugged ear sensation or vomiting. Associated symptoms comments: Chills, myalgias, rhinorrhea, post nasal drainage. He has tried acetaminophen and NSAIDs for the symptoms. The treatment provided moderate relief.     Problems:  Patient Active Problem List   Diagnosis Date Noted   Spondylosis without myelopathy 05/20/2021   Primary localized osteoarthrosis of multiple sites 05/20/2021   Polyarthralgia 05/20/2021   Enteropathic arthropathy 03/01/2019   IFG (impaired fasting glucose) 02/19/2019   OA (osteoarthritis) of knee 12/31/2018   Osteoarthritis of both hands 10/07/2015   Essential hypertension 04/24/2014   Diffuse connective tissue disease ([place]) 07/23/2013   CAD (coronary artery disease), non obstructive by cath 07/19/11, treat medically 07/19/2011  Dyslipidemia 07/16/2011   History of smoking 07/16/2011   Family history of coronary artery bypass surgery 07/16/2011   Crohn disease ([place]) 07/16/2011   Peptic ulcer disease, endo 2011 07/16/2011    Allergies:  Allergies  Allergen Reactions   Latex     Pt stated that  latex causes itching.   Gabapentin Rash   Medications:  Current Outpatient Medications:    albuterol (VENTOLIN HFA) 108 (90 Base) MCG/ACT inhaler, INHALE 2 PUFFS INTO THE LUNGS EVERY 6 HOURS AS NEEDED FOR WHEEZING OR SHORTNESS OF BREATH, Disp: 18 g, Rfl: 2   molnupiravir EUA (LAGEVRIO) 200 mg CAPS capsule, Take 4 capsules (800 mg total) by mouth 2 (two) times daily for 5 days., Disp: 40 capsule, Rfl: 0   amLODipine (NORVASC) 5 MG tablet, TAKE 1 TABLET(5 MG) BY MOUTH DAILY, Disp: 90 tablet, Rfl: 4   fluticasone (FLONASE) 50 MCG/ACT nasal spray, Place 1 spray into both nostrils daily. , Disp: , Rfl:    lisinopril (ZESTRIL) 20 MG tablet, Take 1 tablet (20 mg total) by mouth daily., Disp: 90 tablet, Rfl: 4   meloxicam (MOBIC) 15 MG tablet, Take 1 tablet (15 mg total) by mouth daily as needed for pain., Disp: 90 tablet, Rfl: 1   mesalamine (APRISO) 0.375 g 24 hr capsule, Take 4 capsules (1.5 g total) by mouth every morning. 1568m=4 capsules (Patient taking differently: Take 1,500 mg by mouth daily at 2 am. 15064m4 capsules), Disp: 360 capsule, Rfl: 1   rosuvastatin (CRESTOR) 40 MG tablet, Take 1 tablet (40 mg total) by mouth daily., Disp: 90 tablet, Rfl: 4   traMADol (ULTRAM) 50 MG tablet, Take 50 mg by mouth every 4 (four) hours as needed. (Patient not taking: Reported on 05/20/2021), Disp: , Rfl:   Observations/Objective: Patient is well-developed, well-nourished in no acute distress.  Resting comfortably at home.  Head is normocephalic, atraumatic.  No labored breathing.  Speech is clear and coherent with logical content.  Patient is alert and oriented at baseline.    Assessment and Plan: 1. COVID-19 - MyChart COVID-19 home monitoring program; Future - molnupiravir EUA (LAGEVRIO) 200 mg CAPS capsule; Take 4 capsules (800 mg total) by mouth 2 (two) times daily for 5 days.  Dispense: 40 capsule; Refill: 0  - Continue OTC symptomatic management of choice - Will send OTC vitamins and  supplement information through AVS - Molnupiravir prescribed - Patient enrolled in MyChart symptom monitoring - Push fluids - Rest as needed - Discussed return precautions and when to seek in-person evaluation, sent via AVS as well   Follow Up Instructions: I discussed the assessment and treatment plan with the patient. The patient was provided an opportunity to ask questions and all were answered. The patient agreed with the plan and demonstrated an understanding of the instructions.  A copy of instructions were sent to the patient via MyChart unless otherwise noted below.    The patient was advised to call back or seek an in-person evaluation if the symptoms worsen or if the condition fails to improve as anticipated.  Time:  I spent 15 minutes with the patient via telehealth technology discussing the above problems/concerns.    JeMar DaringPA-C

## 2021-07-26 NOTE — Patient Instructions (Signed)
Frederick Rush, thank you for joining Mar Daring, PA-C for today's virtual visit.  While this provider is not your primary care provider (PCP), if your PCP is located in our provider database this encounter information will be shared with them immediately following your visit.  Consent: (Patient) Frederick Rush provided verbal consent for this virtual visit at the beginning of the encounter.  Current Medications:  Current Outpatient Medications:    albuterol (VENTOLIN HFA) 108 (90 Base) MCG/ACT inhaler, INHALE 2 PUFFS INTO THE LUNGS EVERY 6 HOURS AS NEEDED FOR WHEEZING OR SHORTNESS OF BREATH, Disp: 18 g, Rfl: 2   molnupiravir EUA (LAGEVRIO) 200 mg CAPS capsule, Take 4 capsules (800 mg total) by mouth 2 (two) times daily for 5 days., Disp: 40 capsule, Rfl: 0   amLODipine (NORVASC) 5 MG tablet, TAKE 1 TABLET(5 MG) BY MOUTH DAILY, Disp: 90 tablet, Rfl: 4   fluticasone (FLONASE) 50 MCG/ACT nasal spray, Place 1 spray into both nostrils daily. , Disp: , Rfl:    lisinopril (ZESTRIL) 20 MG tablet, Take 1 tablet (20 mg total) by mouth daily., Disp: 90 tablet, Rfl: 4   meloxicam (MOBIC) 15 MG tablet, Take 1 tablet (15 mg total) by mouth daily as needed for pain., Disp: 90 tablet, Rfl: 1   mesalamine (APRISO) 0.375 g 24 hr capsule, Take 4 capsules (1.5 g total) by mouth every morning. 1546m=4 capsules (Patient taking differently: Take 1,500 mg by mouth daily at 2 am. 15060m4 capsules), Disp: 360 capsule, Rfl: 1   rosuvastatin (CRESTOR) 40 MG tablet, Take 1 tablet (40 mg total) by mouth daily., Disp: 90 tablet, Rfl: 4   traMADol (ULTRAM) 50 MG tablet, Take 50 mg by mouth every 4 (four) hours as needed. (Patient not taking: Reported on 05/20/2021), Disp: , Rfl:    Medications ordered in this encounter:  Meds ordered this encounter  Medications   molnupiravir EUA (LAGEVRIO) 200 mg CAPS capsule    Sig: Take 4 capsules (800 mg total) by mouth 2 (two) times daily for 5 days.    Dispense:   40 capsule    Refill:  0    Order Specific Question:   Supervising Provider    Answer:   [place] [place]   *If you need refills on other medications prior to your next appointment, please contact your pharmacy*  Follow-Up: Call back or seek an in-person evaluation if the symptoms worsen or if the condition fails to improve as anticipated.  Other Instructions 10 Things You Can Do to Manage Your COVID-19 Symptoms at Home If you have possible or confirmed COVID-19 Stay home except to get medical care. Monitor your symptoms carefully. If your symptoms get worse, call your healthcare provider immediately. Get rest and stay hydrated. If you have a medical appointment, call the healthcare provider ahead of time and tell them that you have or may have COVID-19. For medical emergencies, call 911 and notify the dispatch personnel that you have or may have COVID-19. Cover your cough and sneezes with a tissue or use the inside of your elbow. Wash your hands often with soap and water for at least 20 seconds or clean your hands with an alcohol-based hand sanitizer that contains at least 60% alcohol. As much as possible, stay in a specific room and away from other people in your home. Also, you should use a separate bathroom, if available. If you need to be around other people in or outside of the home, wear a mask.  Avoid sharing personal items with other people in your household, like dishes, towels, and bedding. Clean all surfaces that are touched often, like counters, tabletops, and doorknobs. Use household cleaning sprays or wipes according to the label instructions. [email or web address] 09/06/2019 This information is not intended to replace advice given to you by your health care provider. Make sure you discuss any questions you have with your health care provider. Document Revised: 10/30/2020 Document Reviewed: 10/30/2020 Elsevier Patient Education  2022 [employer].    If you have  been instructed to have an in-person evaluation today at a local Urgent Care facility, please use the link below. It will take you to a list of all of our available [facility], including address, phone number and hours of operation. Please do not delay care.  [facility]  If you or a family member do not have a primary care provider, use the link below to schedule a visit and establish care. When you choose a [facility] primary care physician or advanced practice provider, you gain a long-term partner in health. Find a Primary Care Provider  Learn more about [facility] in-office and virtual care options: [facility]

## 2021-07-26 NOTE — Progress Notes (Signed)
   Thank you for the details you included in the comment boxes. Those details are very helpful in determining the best course of treatment for you and help [place] to provide the best care.Because being Covid positive and moderate to high risk, we recommend that you convert this visit to a video visit in order for the provider to better assess what is going on.  The provider will be able to give you a more accurate diagnosis and treatment plan if we can more freely discuss your symptoms and with the addition of a virtual examination.   If you desire the FDA-emergency use antiviral medication options, we do require a video visit for that. To access a video visit please select the "Virtual Urgent Care Visit" option in the [employer].  If you convert to a video visit, we will bill your insurance (similar to an office visit) and you will not be charged for this e-Visit. You will be able to stay at home and speak with the first available [facility] advanced practice provider. The link to do a video visit is in the drop down Menu tab of your Welcome screen in [place].  I provided 5 minutes of non face-to-face time during this encounter for chart review and documentation.

## 2021-08-19 ENCOUNTER — Ambulatory Visit: Payer: Medicare Other | Admitting: Family Medicine

## 2021-08-30 ENCOUNTER — Other Ambulatory Visit: Payer: Self-pay | Admitting: Family Medicine

## 2021-08-30 DIAGNOSIS — M19042 Primary osteoarthritis, left hand: Secondary | ICD-10-CM

## 2021-09-13 ENCOUNTER — Encounter: Payer: Self-pay | Admitting: Family Medicine

## 2021-09-13 ENCOUNTER — Ambulatory Visit (INDEPENDENT_AMBULATORY_CARE_PROVIDER_SITE_OTHER): Payer: Medicare Other | Admitting: Family Medicine

## 2021-09-13 VITALS — BP 136/68 | HR 55 | Ht 68.0 in | Wt 180.0 lb

## 2021-09-13 DIAGNOSIS — R7301 Impaired fasting glucose: Secondary | ICD-10-CM

## 2021-09-13 DIAGNOSIS — M359 Systemic involvement of connective tissue, unspecified: Secondary | ICD-10-CM | POA: Diagnosis not present

## 2021-09-13 DIAGNOSIS — I10 Essential (primary) hypertension: Secondary | ICD-10-CM

## 2021-09-13 DIAGNOSIS — K5000 Crohn's disease of small intestine without complications: Secondary | ICD-10-CM | POA: Diagnosis not present

## 2021-09-13 DIAGNOSIS — E785 Hyperlipidemia, unspecified: Secondary | ICD-10-CM

## 2021-09-13 DIAGNOSIS — I2510 Atherosclerotic heart disease of native coronary artery without angina pectoris: Secondary | ICD-10-CM | POA: Diagnosis not present

## 2021-09-13 LAB — POCT GLYCOSYLATED HEMOGLOBIN (HGB A1C): Hemoglobin A1C: 5.5 % (ref 4.0–5.6)

## 2021-09-13 NOTE — Progress Notes (Signed)
   Established Patient Office Visit  Subjective   Patient ID: Frederick Rush, male    DOB: [birth year]  Age: 71 y.o. MRN: [id number]  Chief Complaint  Patient presents with   Hypertension   ifg    HPI  Hypertension- Pt denies chest pain, SOB, dizziness, or heart palpitations.  Taking meds as directed w/o problems.  Denies medication side effects.    Impaired fasting glucose-no increased thirst or urination. No symptoms consistent with hypoglycemia.  F/u Crohn's disease -says overall he is doing really well he had a follow-up colonoscopy recently and they found 2 polyps that he feels stable on his current regimen.  He is currently on Apriso.  He did have COVID back in June but has formally recovered.  He still follows with Dr. Donnella Bi for cardiology for his coronary artery disease.    ROS    Objective:     BP 136/68   Pulse (!) 55   Ht 5' 8"  (1.727 m)   Wt 180 lb (81.6 kg)   SpO2 98%   BMI 27.37 kg/m    Physical Exam Constitutional:      Appearance: He is well-developed.  HENT:     Head: Normocephalic and atraumatic.  Cardiovascular:     Rate and Rhythm: Normal rate and regular rhythm.     Heart sounds: Normal heart sounds.  Pulmonary:     Effort: Pulmonary effort is normal.     Breath sounds: Normal breath sounds.  Skin:    General: Skin is warm and dry.  Neurological:     Mental Status: He is alert and oriented to person, place, and time.  Psychiatric:        Behavior: Behavior normal.      Results for orders placed or performed in visit on 09/13/21  POCT glycosylated hemoglobin (Hb A1C)  Result Value Ref Range   Hemoglobin A1C 5.5 4.0 - 5.6 %   HbA1c POC (<> result, manual entry)     HbA1c, POC (prediabetic range)     HbA1c, POC (controlled diabetic range)        The ASCVD Risk score (Arnett DK, et al., 2019) failed to calculate for the following reasons:   The valid total cholesterol range is 130 to 320 mg/dL    Assessment & Plan:    Problem List Items Addressed This Visit       Cardiovascular and Mediastinum   CAD (coronary artery disease), non obstructive by cath 07/19/11, treat medically (Chronic)    No recent chest pain or shortness of breath.  Continue Crestor.      Essential hypertension    Well controlled. Continue current regimen. Follow up in  6 mo . Labs are up to date.         Endocrine   IFG (impaired fasting glucose) - Primary    Well controlled. Continue current regimen. Follow up in  6 months.       Relevant Orders   POCT glycosylated hemoglobin (Hb A1C) (Completed)     Other   Dyslipidemia (Chronic)    Continue daily statin.      Crohn disease ([place]) (Chronic)    Continue to follow with digestive health.  Upcoming colonoscopy.      Diffuse connective tissue disease (H[facility])    Stable.       Return in about 6 months (around 03/16/2022) for Hypertension.    Beatrice Lecher, MD

## 2021-09-13 NOTE — Assessment & Plan Note (Signed)
Well controlled. Continue current regimen. Follow up in  6 months.  

## 2021-09-13 NOTE — Assessment & Plan Note (Signed)
Well controlled. Continue current regimen. Follow up in  6 mo . Labs are up to date.

## 2021-09-15 DIAGNOSIS — I10 Essential (primary) hypertension: Secondary | ICD-10-CM | POA: Diagnosis not present

## 2021-09-15 DIAGNOSIS — Z79899 Other long term (current) drug therapy: Secondary | ICD-10-CM | POA: Diagnosis not present

## 2021-09-15 LAB — COMPREHENSIVE METABOLIC PANEL
Albumin: 4.6 (ref 3.5–5.0)
Calcium: 9.8 (ref 8.7–10.7)
Globulin: 2.3
eGFR: 102

## 2021-09-15 LAB — CBC: RBC: 4.46 (ref 3.87–5.11)

## 2021-09-15 LAB — CBC AND DIFFERENTIAL
HCT: 42 (ref 41–53)
Hemoglobin: 13.6 (ref 13.5–17.5)
Platelets: 317 10*3/uL (ref 150–400)
WBC: 8.9

## 2021-09-15 LAB — BASIC METABOLIC PANEL
BUN: 16 (ref 4–21)
CO2: 25 — AB (ref 13–22)
Chloride: 99 (ref 99–108)
Creatinine: 0.9 (ref 0.6–1.3)
Glucose: 98
Potassium: 4.4 mEq/L (ref 3.5–5.1)
Sodium: 139 (ref 137–147)

## 2021-09-15 NOTE — Assessment & Plan Note (Signed)
Stable

## 2021-09-15 NOTE — Assessment & Plan Note (Addendum)
No recent chest pain or shortness of breath.  Continue Crestor.

## 2021-09-15 NOTE — Assessment & Plan Note (Signed)
Continue to follow with digestive health.  Upcoming colonoscopy.

## 2021-09-15 NOTE — Assessment & Plan Note (Signed)
Continue daily statin.

## 2021-09-22 DIAGNOSIS — N182 Chronic kidney disease, stage 2 (mild): Secondary | ICD-10-CM | POA: Diagnosis not present

## 2021-09-22 DIAGNOSIS — I10 Essential (primary) hypertension: Secondary | ICD-10-CM | POA: Diagnosis not present

## 2021-10-07 DIAGNOSIS — H53123 Transient visual loss, bilateral: Secondary | ICD-10-CM | POA: Diagnosis not present

## 2021-10-07 DIAGNOSIS — H33101 Unspecified retinoschisis, right eye: Secondary | ICD-10-CM | POA: Diagnosis not present

## 2021-10-07 DIAGNOSIS — Z961 Presence of intraocular lens: Secondary | ICD-10-CM | POA: Diagnosis not present

## 2021-10-07 DIAGNOSIS — H25811 Combined forms of age-related cataract, right eye: Secondary | ICD-10-CM | POA: Diagnosis not present

## 2021-10-13 ENCOUNTER — Encounter: Payer: Self-pay | Admitting: General Practice

## 2021-11-08 DIAGNOSIS — M47899 Other spondylosis, site unspecified: Secondary | ICD-10-CM | POA: Diagnosis not present

## 2021-11-08 DIAGNOSIS — K5090 Crohn's disease, unspecified, without complications: Secondary | ICD-10-CM | POA: Diagnosis not present

## 2021-11-08 DIAGNOSIS — Z79899 Other long term (current) drug therapy: Secondary | ICD-10-CM | POA: Diagnosis not present

## 2021-11-08 DIAGNOSIS — M1991 Primary osteoarthritis, unspecified site: Secondary | ICD-10-CM | POA: Diagnosis not present

## 2021-11-27 ENCOUNTER — Other Ambulatory Visit (HOSPITAL_COMMUNITY): Payer: Self-pay

## 2022-01-10 ENCOUNTER — Telehealth: Payer: Self-pay | Admitting: Family Medicine

## 2022-01-10 NOTE — Telephone Encounter (Signed)
LVM on patient mobile line to schedule annual wellness visit. Frederick Rush

## 2022-03-02 ENCOUNTER — Other Ambulatory Visit: Payer: Self-pay | Admitting: Family Medicine

## 2022-03-02 DIAGNOSIS — M19041 Primary osteoarthritis, right hand: Secondary | ICD-10-CM

## 2022-03-17 ENCOUNTER — Encounter: Payer: Self-pay | Admitting: Family Medicine

## 2022-03-17 ENCOUNTER — Ambulatory Visit (INDEPENDENT_AMBULATORY_CARE_PROVIDER_SITE_OTHER): Payer: Medicare Other | Admitting: Family Medicine

## 2022-03-17 VITALS — BP 142/71 | HR 56 | Ht 68.0 in | Wt 181.0 lb

## 2022-03-17 DIAGNOSIS — R7301 Impaired fasting glucose: Secondary | ICD-10-CM | POA: Diagnosis not present

## 2022-03-17 DIAGNOSIS — I2510 Atherosclerotic heart disease of native coronary artery without angina pectoris: Secondary | ICD-10-CM

## 2022-03-17 DIAGNOSIS — Z23 Encounter for immunization: Secondary | ICD-10-CM

## 2022-03-17 DIAGNOSIS — I10 Essential (primary) hypertension: Secondary | ICD-10-CM

## 2022-03-17 MED ORDER — AMLODIPINE BESYLATE 10 MG PO TABS: 10.0000 mg | ORAL_TABLET | Freq: Every day | ORAL | 3 refills | Status: DC

## 2022-03-17 NOTE — Assessment & Plan Note (Signed)
uncontrolled. Will inc amlodipine to '10mg'$  daily.   Continue current regimen.  Follow-up in a few weeks for nurse visit to make sure that blood pressure is improving if not at goal we still have some lisinopril to go up to 40 mg if needed.

## 2022-03-17 NOTE — Assessment & Plan Note (Signed)
Last A1c looked great.  Lab Results  Component Value Date   HGBA1C 5.5 09/13/2021

## 2022-03-17 NOTE — Assessment & Plan Note (Signed)
No recent chest pain or shortness of breath.

## 2022-03-17 NOTE — Progress Notes (Signed)
Established Patient Office Visit  Subjective   Patient ID: Frederick Rush, male    DOB: 09-05-1950  Age: 72 y.o. 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  At that time his blood pressure was elevated and they gave him a 30-day prescription of 10 mg amlodipine.  He says his blood pressures improved and he actually felt better when he was taking the amlodipine 10 mg but ran out.  So he has been back on the 5 mg since then.  He denies any recent chest pain or shortness of breath.  Hypertension- Pt denies chest pain, SOB, dizziness, or heart palpitations.  Taking meds as directed w/o problems.  Denies medication side effects.  '  Smoking 20+ years ago.  He says his wife still smokes and more recently asked her not to smoke in the house anymore to reduce second hand exposure    ROS    Objective:     BP (!) 142/71 (BP Location: Right Arm, Patient Position: Sitting, Cuff Size: Large)   Pulse (!) 56   Ht '5\' 8"'$  (1.727 m)   Wt 181 lb (82.1 kg)   SpO2 98%   BMI 27.52 kg/m    Physical Exam Constitutional:      Appearance: He is well-developed.  HENT:     Head: Normocephalic and atraumatic.  Cardiovascular:     Rate and Rhythm: Normal rate and regular rhythm.     Heart sounds: Normal heart sounds.  Pulmonary:     Effort: Pulmonary effort is normal.     Comments: Coarse breath sounds with prolonged expiration. Skin:    General: Skin is warm and dry.  Neurological:     Mental Status: He is alert and oriented to person, place, and time.  Psychiatric:        Behavior: Behavior normal.      Results for orders placed or performed in visit on 03/17/22  CBC and differential  Result Value Ref Range   Hemoglobin 13.6 13.5 - 17.5   HCT 42 41 - 53   Platelets 317 150 - 400 K/uL   WBC 8.9   CBC  Result Value Ref Range   RBC 4.46 3.87 - 6.30  Basic  metabolic panel  Result Value Ref Range   Glucose 98    BUN 16 4 - 21   CO2 25 (A) 13 - 22   Creatinine 0.9 0.6 - 1.3   Potassium 4.4 3.5 - 5.1 mEq/L   Sodium 139 137 - 147   Chloride 99 99 - 108  Comprehensive metabolic panel  Result Value Ref Range   Globulin 2.3    eGFR 102    Calcium 9.8 8.7 - 10.7   Albumin 4.6 3.5 - 5.0      The ASCVD Risk score (Arnett DK, et al., 2019) failed to calculate for the following reasons:   The valid total cholesterol range is 130 to 320 mg/dL    Assessment & Plan:   Problem List Items Addressed This Visit       Cardiovascular and Mediastinum   CAD (coronary artery disease), non obstructive by cath 07/19/11, treat medically (Chronic)    No recent chest pain or shortness of breath.      Relevant Medications   amLODipine (NORVASC) 10 MG tablet   Essential hypertension - Primary  uncontrolled. Will inc amlodipine to '10mg'$  daily.   Continue current regimen.  Follow-up in a few weeks for nurse visit to make sure that blood pressure is improving if not at goal we still have some lisinopril to go up to 40 mg if needed.      Relevant Medications   amLODipine (NORVASC) 10 MG tablet   Other Relevant Orders   BASIC METABOLIC PANEL WITH GFR     Endocrine   IFG (impaired fasting glucose)    Last A1c looked great.  Lab Results  Component Value Date   HGBA1C 5.5 09/13/2021        Other Visit Diagnoses     Need for pneumococcal 20-valent conjugate vaccination       Relevant Orders   Pneumococcal conjugate vaccine 20-valent (Prevnar 20) (Completed)        Return in about 2 weeks (around 03/31/2022) for Nurse visit in 2 weeks, F/U PCP in 6 months.  Beatrice Lecher, MD

## 2022-03-18 LAB — BASIC METABOLIC PANEL WITH GFR
BUN: 15 mg/dL (ref 7–25)
CO2: 28 mmol/L (ref 20–32)
Calcium: 9.7 mg/dL (ref 8.6–10.3)
Chloride: 102 mmol/L (ref 98–110)
Creat: 1.03 mg/dL (ref 0.70–1.28)
Glucose, Bld: 70 mg/dL (ref 65–99)
Potassium: 4.2 mmol/L (ref 3.5–5.3)
Sodium: 140 mmol/L (ref 135–146)
eGFR: 77 mL/min/{1.73_m2} (ref >=60)

## 2022-03-18 NOTE — Progress Notes (Signed)
Your lab work is within acceptable range and there are no concerning findings.   ?

## 2022-04-01 ENCOUNTER — Ambulatory Visit (INDEPENDENT_AMBULATORY_CARE_PROVIDER_SITE_OTHER): Payer: Medicare Other | Admitting: Family Medicine

## 2022-04-01 VITALS — BP 102/61 | HR 65 | Ht 68.0 in | Wt 181.0 lb

## 2022-04-01 DIAGNOSIS — I10 Essential (primary) hypertension: Secondary | ICD-10-CM | POA: Diagnosis not present

## 2022-04-01 NOTE — Progress Notes (Signed)
Agree with documentation as above.   Lexington Krotz, MD  

## 2022-04-01 NOTE — Progress Notes (Signed)
Patient is here for blood pressure check.   Previous BP was 142/71  1st BP today: 102/61  2nd BP today (after 10  minutes):   Denies chest pain, dizziness, shortness of breath, severe headache, or nosebleeds. Taking medication as prescribed. Denies missed doses.  Pt states he had BP cuff calibrated at another appointment earlier today. That BP was 124/70's.  Advised pt to keep next upcoming appt with Dr. Madilyn Fireman.

## 2022-04-07 DIAGNOSIS — K5010 Crohn's disease of large intestine without complications: Secondary | ICD-10-CM | POA: Diagnosis not present

## 2022-04-12 ENCOUNTER — Telehealth: Payer: Self-pay | Admitting: Family Medicine

## 2022-04-12 NOTE — Telephone Encounter (Signed)
Called patient to schedule Medicare Annual Wellness Visit (AWV). Left message for patient to call back and schedule Medicare Annual Wellness Visit (AWV).  Last date of AWV: Never  Please schedule an appointment at any time with Nurse Health Advisor.  If any questions, please contact me at [phone].  Thank you ,  Lin Givens Patient Access Advocate II Direct Dial: [phone]

## 2022-04-13 ENCOUNTER — Ambulatory Visit: Payer: Medicare Other | Attending: Cardiovascular Disease | Admitting: Cardiovascular Disease

## 2022-04-13 ENCOUNTER — Encounter: Payer: Self-pay | Admitting: Cardiovascular Disease

## 2022-04-13 VITALS — BP 118/64 | HR 54 | Ht 68.0 in | Wt 182.4 lb

## 2022-04-13 DIAGNOSIS — Z87891 Personal history of nicotine dependence: Secondary | ICD-10-CM | POA: Diagnosis not present

## 2022-04-13 DIAGNOSIS — I2510 Atherosclerotic heart disease of native coronary artery without angina pectoris: Secondary | ICD-10-CM

## 2022-04-13 DIAGNOSIS — I10 Essential (primary) hypertension: Secondary | ICD-10-CM | POA: Diagnosis not present

## 2022-04-13 NOTE — Assessment & Plan Note (Signed)
Remote history tobacco abuse having quit over 20 years ago.

## 2022-04-13 NOTE — Assessment & Plan Note (Signed)
History of nonobstructive CAD by cath performed by Dr. Glenetta Hew 07/15/2011.  He denies chest pain or shortness of breath.

## 2022-04-13 NOTE — Progress Notes (Signed)
04/13/2022 Frederick Rush   September 10, 1950  [id number]  Primary Physician Frederick Marry, MD Primary Cardiologist: Frederick Harp MD Frederick Rush, [place], [place]  HPI:  Frederick Rush is a 72 y.o.  mildly overweight, married, Caucasian male, father of two, grandfather to two grandchildren, who I last saw in the office 04/13/2021.  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  He has a history of noncritical CAD found on cath performed by Dr. Ula Rush on Jul 15, 2011. His other problems include hyperlipidemia and remote tobacco abuse.    He had a uncomplicated left total knee replacement on 12/31/2018 by Dr. Ricki Rush which he is rehabilitated from.     Since I saw him a year ago he continues to do well.  He denies chest pain or shortness of breath.     Current Meds  Medication Sig   albuterol (VENTOLIN HFA) 108 (90 Base) MCG/ACT inhaler INHALE 2 PUFFS INTO THE LUNGS EVERY 6 HOURS AS NEEDED FOR WHEEZING OR SHORTNESS OF BREATH   amLODipine (NORVASC) 10 MG tablet Take 1 tablet (10 mg total) by mouth daily.   fluticasone (FLONASE) 50 MCG/ACT nasal spray Place 1 spray into both nostrils daily.    lisinopril (ZESTRIL) 20 MG tablet Take 1 tablet (20 mg total) by mouth daily.   meloxicam (MOBIC) 15 MG tablet TAKE 1 TABLET(15 MG) BY MOUTH DAILY AS NEEDED FOR PAIN   mesalamine (APRISO) 0.375 g 24 hr capsule Take 4 capsules (1.5 g total) by mouth every morning. 1523m=4 capsules (Patient taking differently: Take 1,500 mg by mouth daily at 2 am. 1509m4 capsules)   rosuvastatin (CRESTOR) 40 MG tablet Take 1 tablet (40 mg total) by mouth daily.     Allergies  Allergen Reactions   Latex     Pt stated that latex causes itching.   Gabapentin Rash    Social History   Socioeconomic History   Marital status: Married    Spouse name: Frederick Rush Number of children: 2   Years of education: Associates   Highest education level: Not on file  Occupational History    Occupation: BULK DRIVER    Employer: [employer]    Comment: Retired   Occupation: Freight Liner-Part time  Tobacco Use   Smoking status: Former    Types: Cigarettes    Quit date: 07/16/1998    Years since quitting: 23.7   Smokeless tobacco: Never  Vaping Use   Vaping Use: Never used  Substance and Sexual Activity   Alcohol use: Yes    Alcohol/week: 5.0 standard drinks of alcohol    Types: 5 Cans of beer per week   Drug use: No   Sexual activity: Yes    Partners: Female    Birth control/protection: None, Post-menopausal  Other Topics Concern   Not on file  Social History Narrative   Marital status: married x 39 years      Children:  2 children; 3 grandchildren; no gg      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      Tobacco:  None; quit in 2000; smoked x 15 years + 20 years = 35 years.      Alcohol:    5 beers a week  Exercise: mows yard.      Seatbelt: 100%; no texting   Social Determinants of Radio broadcast assistant Strain: Not on file  Food Insecurity: Not on file  Transportation Needs: Not on file  Physical Activity: Not on file  Stress: Not on file  Social Connections: Not on file  Intimate Partner Violence: Not on file     Review of Systems: General: negative for chills, fever, night sweats or weight changes.  Cardiovascular: negative for chest pain, dyspnea on exertion, edema, orthopnea, palpitations, paroxysmal nocturnal dyspnea or shortness of breath Dermatological: negative for rash Respiratory: negative for cough or wheezing Urologic: negative for hematuria Abdominal: negative for nausea, vomiting, diarrhea, bright red blood per rectum, melena, or hematemesis Neurologic: negative for visual changes, syncope, or dizziness All other systems reviewed and are otherwise negative except as noted  above.    Blood pressure 118/64, pulse (!) 54, height 5' 8"$  (1.727 m), weight 182 lb 6.4 oz (82.7 kg), SpO2 97 %.  General appearance: alert and no distress Neck: no adenopathy, no carotid bruit, no JVD, supple, symmetrical, trachea midline, and thyroid not enlarged, symmetric, no tenderness/mass/nodules Lungs: clear to auscultation bilaterally Heart: regular rate and rhythm, S1, S2 normal, no murmur, click, rub or gallop Extremities: extremities normal, atraumatic, no cyanosis or edema Pulses: 2+ and symmetric Skin: Skin color, texture, turgor normal. No rashes or lesions Neurologic: Grossly normal  EKG sinus bradycardia at 54 without ST or T wave changes.  Personally reviewed this EKG.  ASSESSMENT AND PLAN:   History of smoking Remote history tobacco abuse having quit over 20 years ago.  CAD (coronary artery disease), non obstructive by cath 07/19/11, treat medically History of nonobstructive CAD by cath performed by Dr. Glenetta Hew 07/15/2011.  He denies chest pain or shortness of breath.  Essential hypertension History of essential hypertension a blood pressure measured today at 118/60.  He is on amlodipine 10 mg which was just increased from 5 to 10 mg as well as lisinopril.     Frederick Harp MD FACP,FACC,FAHA, [facility] 04/13/2022 2:46 PM

## 2022-04-13 NOTE — Patient Instructions (Signed)
Medication Instructions:  Your physician recommends that you continue on your current medications as directed. Please refer to the Current Medication list given to you today.  *If you need a refill on your cardiac medications before your next appointment, please call your pharmacy*   Follow-Up: At [facility], you and your health needs are our priority.  As part of our continuing mission to provide you with exceptional heart care, we have created designated Provider Care Teams.  These Care Teams include your primary Cardiologist (physician) and Advanced Practice Providers (APPs -  Physician Assistants and Nurse Practitioners) who all work together to provide you with the care you need, when you need it.  We recommend signing up for the patient portal called "MyChart".  Sign up information is provided on this After Visit Summary.  MyChart is used to connect with patients for Virtual Visits (Telemedicine).  Patients are able to view lab/test results, encounter notes, upcoming appointments, etc.  Non-urgent messages can be sent to your provider as well.   To learn more about what you can do with MyChart, go to [email or web address].    Your next appointment:   12 month(s)  Provider:   Quay Burow, MD

## 2022-04-13 NOTE — Assessment & Plan Note (Signed)
History of essential hypertension a blood pressure measured today at 118/60.  He is on amlodipine 10 mg which was just increased from 5 to 10 mg as well as lisinopril.

## 2022-04-18 ENCOUNTER — Encounter: Payer: Self-pay | Admitting: Family Medicine

## 2022-04-18 ENCOUNTER — Ambulatory Visit: Payer: Medicare Other | Admitting: Family Medicine

## 2022-04-18 VITALS — BP 122/66 | HR 56 | Ht 68.0 in | Wt 182.0 lb

## 2022-04-18 DIAGNOSIS — M47819 Spondylosis without myelopathy or radiculopathy, site unspecified: Secondary | ICD-10-CM

## 2022-04-18 MED ORDER — BACLOFEN 10 MG PO TABS: 10.0000 mg | ORAL_TABLET | Freq: Three times a day (TID) | ORAL | 0 refills | Status: DC

## 2022-04-18 MED ORDER — PREDNISONE 50 MG PO TABS: ORAL_TABLET | ORAL | 0 refills | Status: DC

## 2022-04-18 NOTE — Assessment & Plan Note (Signed)
Increased low back pain with radiation to the left leg.  Responded well to prednisone in the past.  Will add burst of prednisone as well as baclofen as needed.  Given handout for home spinal rehab program.  Red flags reviewed.  Instructed to follow-up if having new or worsening symptoms.

## 2022-04-18 NOTE — Patient Instructions (Signed)
Try back exercises.  Prednisone '50mg'$  daily for 5 days.  Baclofen as needed up to 3 times per day.  Let us know if not improving.

## 2022-04-18 NOTE — Progress Notes (Signed)
Geral Malpass - 72 y.o. male MRN [id number]  Date of birth: 01/23/1951  Subjective Chief Complaint  Patient presents with   Back Pain   Leg Pain    HPI Lanny Hurst is a 72 year old male here today with complaint of back pain.  Pain located in the mid to lower back.  Pain is bilateral but worse on the left side.  He does have radiation into the left leg.  Denies any weakness in the leg.  Tingling sensation down the back of the leg.  He has had similar symptoms before which responded well to prednisone.  Denies bowel or bladder incontinence.  Has not really tried anything so far for treatment of symptoms.  ROS:  A comprehensive ROS was completed and negative except as noted per HPI  Allergies  Allergen Reactions   Latex     Pt stated that latex causes itching.   Gabapentin Rash    Past Medical History:  Diagnosis Date   Arthritis    CAD (coronary artery disease), non obstructive by cath 07/19/11, treat medically 07/19/2011   Crohn disease ([place])    Hypercholesterolemia    Hyperlipidemia    Myocardial infarction [facility])    Skin cancer 2006   Left shoulder; followed every six months.      Past Surgical History:  Procedure Laterality Date   APPENDECTOMY     CARDIAC CATHETERIZATION  07/15/2011   Medical management   ELBOW SURGERY Right    FRACTURE SURGERY Right 2015   elbow   HERNIA REPAIR     KNEE SURGERY     arthroscopy   LEFT HEART CATHETERIZATION WITH CORONARY ANGIOGRAM  07/19/2011   Procedure: LEFT HEART CATHETERIZATION WITH CORONARY ANGIOGRAM;  Surgeon: Leonie Man, MD;  Location: [facility] CATH LAB;  Service: Cardiovascular;;   TONSILLECTOMY     TOTAL KNEE ARTHROPLASTY Left 12/31/2018   Procedure: TOTAL KNEE ARTHROPLASTY;  Surgeon: Gaynelle Arabian, MD;  Location: [facility] ORS;  Service: Orthopedics;  Laterality: Left;  11mn   VASECTOMY      Social History   Socioeconomic History   Marital status: Married    Spouse name: [name]  Number of children: 2   Years of education:  Associates   Highest education level: Not on file  Occupational History   Occupation: BULK DRIVER    Employer: [employer]    Comment: Retired   Occupation: Freight Liner-Part time  Tobacco Use   Smoking status: Former    Types: Cigarettes    Quit date: 07/16/1998    Years since quitting: 23.7   Smokeless tobacco: Never  Vaping Use   Vaping Use: Never used  Substance and Sexual Activity   Alcohol use: Yes    Alcohol/week: 5.0 standard drinks of alcohol    Types: 5 Cans of beer per week   Drug use: No   Sexual activity: Yes    Partners: Female    Birth control/protection: None, Post-menopausal  Other Topics Concern   Not on file  Social History Narrative   Marital status: married x 39 years      Children:  2 children; 3 grandchildren; no gg      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      Tobacco:  None; quit in 2000; smoked x 15 years + 20 years =  35 years.      Alcohol:    5 beers a week       Exercise: mows yard.      Seatbelt: 100%; no texting   Social Determinants of Radio broadcast assistant Strain: Not on file  Food Insecurity: Not on file  Transportation Needs: Not on file  Physical Activity: Not on file  Stress: Not on file  Social Connections: Not on file    Family History  Problem Relation Age of Onset   Cancer Mother        Breast cancer; leukemia   Heart disease Mother 58       CABG   Hypertension Brother     Health Maintenance  Topic Date Due   Medicare Annual Wellness (AWV)  Never done   COVID-19 Vaccine (7 - 2023-24 season) 01/01/2022   COLONOSCOPY (Pts 45-30yr Insurance coverage will need to be confirmed)  05/06/2025   DTaP/Tdap/Td (3 - Td or Tdap) 09/27/2028   Pneumonia Vaccine 72 Years old  Completed   INFLUENZA VACCINE  Completed   Hepatitis C Screening  Completed   Zoster Vaccines-  Shingrix  Completed   HPV VACCINES  Aged Out     ----------------------------------------------------------------------------------------------------------------------------------------------------------------------------------------------------------------- Physical Exam BP 122/66 (BP Location: Left Arm, Patient Position: Sitting, Cuff Size: Normal)   Pulse (!) 56   Ht '5\' 8"'$  (1.727 m)   Wt 182 lb (82.6 kg)   SpO2 98%   BMI 27.67 kg/m   Physical Exam Constitutional:      Appearance: Normal appearance.  HENT:     Head: Normocephalic and atraumatic.  Eyes:     General: No scleral icterus. Musculoskeletal:     Cervical back: Neck supple.     Comments: Tenderness around thoracolumbar junction.  Left greater than right.  Strength in lower extremities are normal.  Skin:    General: Skin is warm and dry.  Neurological:     Mental Status: He is alert.  Psychiatric:        Mood and Affect: Mood normal.        Behavior: Behavior normal.     ------------------------------------------------------------------------------------------------------------------------------------------------------------------------------------------------------------------- Assessment and Plan  Spondylosis without myelopathy Increased low back pain with radiation to the left leg.  Responded well to prednisone in the past.  Will add burst of prednisone as well as baclofen as needed.  Given handout for home spinal rehab program.  Red flags reviewed.  Instructed to follow-up if having new or worsening symptoms.   Meds ordered this encounter  Medications   predniSONE (DELTASONE) 50 MG tablet    Sig: Take '50mg'$  daily x5 days.    Dispense:  5 tablet    Refill:  0   baclofen (LIORESAL) 10 MG tablet    Sig: Take 1 tablet (10 mg total) by mouth 3 (three) times daily.    Dispense:  30 each    Refill:  0    No follow-ups on file.    This visit occurred during the SARS-CoV-2 public health emergency.  Safety  protocols were in place, including screening questions prior to the visit, additional usage of staff PPE, and extensive cleaning of exam room while observing appropriate contact time as indicated for disinfecting solutions.

## 2022-05-02 DIAGNOSIS — H53123 Transient visual loss, bilateral: Secondary | ICD-10-CM | POA: Diagnosis not present

## 2022-05-02 DIAGNOSIS — Z961 Presence of intraocular lens: Secondary | ICD-10-CM | POA: Diagnosis not present

## 2022-05-02 DIAGNOSIS — H25811 Combined forms of age-related cataract, right eye: Secondary | ICD-10-CM | POA: Diagnosis not present

## 2022-05-02 DIAGNOSIS — H33101 Unspecified retinoschisis, right eye: Secondary | ICD-10-CM | POA: Diagnosis not present

## 2022-05-04 NOTE — Progress Notes (Unsigned)
   Acute Office Visit  Subjective:     Patient ID: Frederick Rush, male    DOB: 1950-04-07, 72 y.o.   MRN: [id number]  No chief complaint on file.   HPI Patient is in today for vision changes.  He is here today after seeing his eye doctor.  On March 6 he woke up with flashes and floaters in one of his eyes.  In that morning had significantly blurred vision.  He could not tell how much coffee was sitting in the bottom of his cup.  He is also been noting episodes where he first gets up in the morning and will lose vision for a few seconds.  He was evaluated at my eye doctor on March 11.  The eye doctor recommended that he be evaluated for orthostasis.  He also need further vascular workup as well including carotid Dopplers etc.  ROS      Objective:    There were no vitals taken for this visit. {Vitals History (Optional):23777}  Physical Exam  No results found for any visits on 05/05/22.      Assessment & Plan:   Problem List Items Addressed This Visit   None   No orders of the defined types were placed in this encounter.   No follow-ups on file.  Beatrice Lecher, MD

## 2022-05-05 ENCOUNTER — Ambulatory Visit (INDEPENDENT_AMBULATORY_CARE_PROVIDER_SITE_OTHER): Payer: Medicare Other | Admitting: Family Medicine

## 2022-05-05 ENCOUNTER — Encounter: Payer: Self-pay | Admitting: Family Medicine

## 2022-05-05 VITALS — BP 131/70 | HR 61 | Ht 68.0 in | Wt 181.0 lb

## 2022-05-05 DIAGNOSIS — H53133 Sudden visual loss, bilateral: Secondary | ICD-10-CM | POA: Diagnosis not present

## 2022-05-05 DIAGNOSIS — H43393 Other vitreous opacities, bilateral: Secondary | ICD-10-CM | POA: Diagnosis not present

## 2022-05-09 DIAGNOSIS — M47899 Other spondylosis, site unspecified: Secondary | ICD-10-CM | POA: Diagnosis not present

## 2022-05-09 DIAGNOSIS — M1991 Primary osteoarthritis, unspecified site: Secondary | ICD-10-CM | POA: Diagnosis not present

## 2022-05-09 DIAGNOSIS — Z79899 Other long term (current) drug therapy: Secondary | ICD-10-CM | POA: Diagnosis not present

## 2022-05-09 DIAGNOSIS — K5090 Crohn's disease, unspecified, without complications: Secondary | ICD-10-CM | POA: Diagnosis not present

## 2022-05-11 ENCOUNTER — Ambulatory Visit (HOSPITAL_BASED_OUTPATIENT_CLINIC_OR_DEPARTMENT_OTHER)
Admission: RE | Admit: 2022-05-11 | Discharge: 2022-05-11 | Disposition: A | Discharge status: Home / Self Care | Payer: Medicare Other | Source: Ambulatory Visit | Attending: Family Medicine | Admitting: Family Medicine

## 2022-05-11 ENCOUNTER — Encounter: Payer: Self-pay | Admitting: Family Medicine

## 2022-05-11 DIAGNOSIS — H43393 Other vitreous opacities, bilateral: Secondary | ICD-10-CM | POA: Diagnosis not present

## 2022-05-11 DIAGNOSIS — I6523 Occlusion and stenosis of bilateral carotid arteries: Secondary | ICD-10-CM | POA: Diagnosis not present

## 2022-05-11 DIAGNOSIS — I7090 Unspecified atherosclerosis: Secondary | ICD-10-CM

## 2022-05-11 DIAGNOSIS — H53133 Sudden visual loss, bilateral: Secondary | ICD-10-CM

## 2022-05-11 NOTE — Progress Notes (Signed)
Hi Lanny Hurst, your [place] of your blood vessels in your neck show about Moderate plaque build up on the left side and mild build up on the right.  Because of the shape of the plaque they do recommend consultation with a vascular surgeon. I would like to place a referral for you.  Do you have a preference for provider and location?  [facility] or [place]?

## 2022-05-13 NOTE — Telephone Encounter (Signed)
Place referral to cardiology, Dr. Alvester Chou regarding ultrasound carotid results.

## 2022-05-13 NOTE — Telephone Encounter (Signed)
Cardiology referral placed to Dr. Gwenlyn Found.

## 2022-05-31 ENCOUNTER — Other Ambulatory Visit: Payer: Self-pay | Admitting: Family Medicine

## 2022-05-31 DIAGNOSIS — M19042 Primary osteoarthritis, left hand: Secondary | ICD-10-CM

## 2022-06-08 ENCOUNTER — Ambulatory Visit: Payer: Medicare Other | Attending: Cardiovascular Disease | Admitting: Cardiovascular Disease

## 2022-06-08 ENCOUNTER — Encounter: Payer: Self-pay | Admitting: Cardiovascular Disease

## 2022-06-08 VITALS — BP 126/62 | HR 61 | Ht 67.0 in | Wt 183.8 lb

## 2022-06-08 DIAGNOSIS — G453 Amaurosis fugax: Secondary | ICD-10-CM | POA: Diagnosis not present

## 2022-06-08 DIAGNOSIS — E785 Hyperlipidemia, unspecified: Secondary | ICD-10-CM | POA: Insufficient documentation

## 2022-06-08 DIAGNOSIS — I10 Essential (primary) hypertension: Secondary | ICD-10-CM

## 2022-06-08 DIAGNOSIS — E782 Mixed hyperlipidemia: Secondary | ICD-10-CM | POA: Diagnosis not present

## 2022-06-08 DIAGNOSIS — I2510 Atherosclerotic heart disease of native coronary artery without angina pectoris: Secondary | ICD-10-CM | POA: Diagnosis not present

## 2022-06-08 NOTE — Assessment & Plan Note (Signed)
History of essential hypertension a blood pressure measured today at 126/62.  He is on amlodipine and lisinopril.

## 2022-06-08 NOTE — Progress Notes (Signed)
06/08/2022 Frederick Rush   Sep 14, 1950  [id number]  Primary Physician Agapito Games, MD Primary Cardiologist: Runell Gess MD Frederick Rush, [place], [place]  HPI:  Frederick Rush is a 72 y.o.  mildly overweight, married, Caucasian male, father of two, grandfather to two grandchildren, who I last saw in the office 04/13/2022.  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  He has a history of noncritical CAD found on cath performed by Dr. Ward Chatters on Jul 15, 2011. His other problems include hyperlipidemia and remote tobacco abuse.    He had a uncomplicated left total knee replacement on 12/31/2018 by Dr. Merlyn Albert which he is rehabilitated from.     Since I saw him in the office 2 months ago he has had an episode of decreased vision in his left eye suggestive of amaurosis fugax.  He did have carotid Dopplers performed 05/11/2022 that showed a 50% stenosis which is eccentric in his left carotid circulation.   Current Meds  Medication Sig   albuterol (VENTOLIN HFA) 108 (90 Base) MCG/ACT inhaler INHALE 2 PUFFS INTO THE LUNGS EVERY 6 HOURS AS NEEDED FOR WHEEZING OR SHORTNESS OF BREATH   amLODipine (NORVASC) 10 MG tablet Take 1 tablet (10 mg total) by mouth daily.   baclofen (LIORESAL) 10 MG tablet Take 1 tablet (10 mg total) by mouth 3 (three) times daily.   dicyclomine (BENTYL) 20 MG tablet Take 20 mg by mouth 3 (three) times daily.   fluticasone (FLONASE) 50 MCG/ACT nasal spray Place 1 spray into both nostrils daily.    lisinopril (ZESTRIL) 20 MG tablet Take 1 tablet (20 mg total) by mouth daily.   meloxicam (MOBIC) 15 MG tablet TAKE 1 TABLET(15 MG) BY MOUTH DAILY AS NEEDED FOR PAIN   mesalamine (APRISO) 0.375 g 24 hr capsule Take 4 capsules (1.5 g total) by mouth every morning. 1500mg =4 capsules (Patient taking differently: Take 1,500 mg by mouth daily at 2 am. 1500mg =4 capsules)   rosuvastatin (CRESTOR) 40 MG tablet Take 1 tablet (40 mg total) by  mouth daily.     Allergies  Allergen Reactions   Latex     Pt stated that latex causes itching.   Gabapentin Rash    Social History   Socioeconomic History   Marital status: Married    Spouse name: Frederick Rush   Number of children: 2   Years of education: Associates   Highest education level: Not on file  Occupational History   Occupation: BULK DRIVER    Employer: [employer]    Comment: Retired   Occupation: Freight Liner-Part time  Tobacco Use   Smoking status: Former    Types: Cigarettes    Quit date: 07/16/1998    Years since quitting: 23.9   Smokeless tobacco: Never  Vaping Use   Vaping Use: Never used  Substance and Sexual Activity   Alcohol use: Yes    Alcohol/week: 5.0 standard drinks of alcohol    Types: 5 Cans of beer per week   Drug use: No   Sexual activity: Yes    Partners: Female    Birth control/protection: None, Post-menopausal  Other Topics Concern   Not on file  Social History Narrative   Marital status: married x 39 years      Children:  2 children; 3 grandchildren; no gg      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  Currently working part time for [employer]       Lives: Lives with his wife and their pets.      Tobacco:  None; quit in 2000; smoked x 15 years + 20 years = 35 years.      Alcohol:    5 beers a week       Exercise: mows yard.      Seatbelt: 100%; no texting   Social Determinants of Corporate investment banker Strain: Not on file  Food Insecurity: Not on file  Transportation Needs: Not on file  Physical Activity: Not on file  Stress: Not on file  Social Connections: Not on file  Intimate Partner Violence: Not on file     Review of Systems: General: negative for chills, fever, night sweats or weight changes.  Cardiovascular: negative for chest pain, dyspnea on exertion, edema, orthopnea, palpitations, paroxysmal nocturnal dyspnea or shortness of breath Dermatological:  negative for rash Respiratory: negative for cough or wheezing Urologic: negative for hematuria Abdominal: negative for nausea, vomiting, diarrhea, bright red blood per rectum, melena, or hematemesis Neurologic: negative for visual changes, syncope, or dizziness All other systems reviewed and are otherwise negative except as noted above.    Blood pressure 126/62, pulse 61, height  (1.702 m), weight 183 lb 12.8 oz (83.4 kg), SpO2 98 %.  General appearance: alert and no distress Neck: no adenopathy, no carotid bruit, no JVD, supple, symmetrical, trachea midline, and thyroid not enlarged, symmetric, no tenderness/mass/nodules Lungs: clear to auscultation bilaterally Heart: regular rate and rhythm, S1, S2 normal, no murmur, click, rub or gallop Extremities: extremities normal, atraumatic, no cyanosis or edema Pulses: 2+ and symmetric Skin: Skin color, texture, turgor normal. No rashes or lesions Neurologic: Grossly normal  EKG not performed today  ASSESSMENT AND PLAN:   CAD (coronary artery disease), non obstructive by cath 07/19/11, treat medically History of nonobstructive CAD by cath performed Dr. Herbie Baltimore 07/15/2011.  He remains asymptomatic.  Essential hypertension History of essential hypertension a blood pressure measured today at 126/62.  He is on amlodipine and lisinopril.  Hyperlipidemia History of hyperlipidemia on high-dose rosuvastatin with lipid profile performed 02/16/2021 revealing total cholesterol 118, LDL 53 and HDL 46.  Amaurosis fugax of left eye Patient complains of symptoms somewhat consistent with amaurosis fugax in his left eye recently.  He had carotid Dopplers that showed a 50% eccentric plaque in his left internal carotid artery.  I am referring him to vascular surgery for further evaluation and potential treatment.  He may need that CTA of his neck to further evaluate.     Runell Gess MD FACP,FACC,FAHA, [facility] 06/08/2022 10:55 AM

## 2022-06-08 NOTE — Patient Instructions (Addendum)
Medication Instructions:  Your physician recommends that you continue on your current medications as directed. Please refer to the Current Medication list given to you today.  *If you need a refill on your cardiac medications before your next appointment, please call your pharmacy*   Follow-Up: At [facility], you and your health needs are our priority.  As part of our continuing mission to provide you with exceptional heart care, we have created designated Provider Care Teams.  These Care Teams include your primary Cardiologist (physician) and Advanced Practice Providers (APPs -  Physician Assistants and Nurse Practitioners) who all work together to provide you with the care you need, when you need it.  We recommend signing up for the patient portal called "MyChart".  Sign up information is provided on this After Visit Summary.  MyChart is used to connect with patients for Virtual Visits (Telemedicine).  Patients are able to view lab/test results, encounter notes, upcoming appointments, etc.  Non-urgent messages can be sent to your provider as well.   To learn more about what you can do with MyChart, go to [email or web address].    Your next appointment:   12 month(s)  Provider:   Jonathan Berry, MD    

## 2022-06-08 NOTE — Assessment & Plan Note (Signed)
History of nonobstructive CAD by cath performed Dr. Herbie Baltimore 07/15/2011.  He remains asymptomatic.

## 2022-06-08 NOTE — Assessment & Plan Note (Signed)
Patient complains of symptoms somewhat consistent with amaurosis fugax in his left eye recently.  He had carotid Dopplers that showed a 50% eccentric plaque in his left internal carotid artery.  I am referring him to vascular surgery for further evaluation and potential treatment.  He may need that CTA of his neck to further evaluate.

## 2022-06-08 NOTE — Assessment & Plan Note (Signed)
History of hyperlipidemia on high-dose rosuvastatin with lipid profile performed 02/16/2021 revealing total cholesterol 118, LDL 53 and HDL 46.

## 2022-06-23 NOTE — Progress Notes (Signed)
Office Note     CC: Concern for amaurosis with left carotid lesion Requesting Provider:  Agapito Games, *  HPI: Frederick Rush is a 72 y.o. (October 15, 1950) male presenting at the request of Dr. Nanetta Batty for concern for left eye amaurosis.  On exam today, Frederick Rush was doing well.  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  In retirement, he continues to work part-time driving trucks.  Frederick Rush is been married to his wife for nearly 45 years, and has 2 children.  Several months ago, he had a heated argument with his boss, and stated that in the following days, he appreciated blindness in bilateral eyes, specifically in the middle of the eye.  He could see around black spot that was located in the middle of his vision.  He washed his face, and the black spot resolved.  Since that time, he has had 3 episodes of blurry vision appreciated in the middle of his vision, described as a square within his field of vision.  This is not black, nor white.  He states that after drinking his coffee, he washes face, and the lesion resolves.  There is no accompanying weakness, sensory deficit.  He has had episodes of headache, which is periorbital, which she thinks is either stress or allergy related.  The pt is  on a statin for cholesterol management.  The pt is not on a daily aspirin.   Other AC:  - The pt is  on medication for hypertension.   The pt is not diabetic.  Tobacco hx:  -  Past Medical History:  Diagnosis Date   Arthritis    CAD (coronary artery disease), non obstructive by cath 07/19/11, treat medically 07/19/2011   Crohn disease (HCC)    Hypercholesterolemia    Hyperlipidemia    Myocardial infarction [facility])    Skin cancer 2006   Left shoulder; followed every six months.      Past Surgical History:  Procedure Laterality Date   APPENDECTOMY     CARDIAC CATHETERIZATION  07/15/2011   Medical management   ELBOW SURGERY Right     FRACTURE SURGERY Right 2015   elbow   HERNIA REPAIR     KNEE SURGERY     arthroscopy   LEFT HEART CATHETERIZATION WITH CORONARY ANGIOGRAM  07/19/2011   Procedure: LEFT HEART CATHETERIZATION WITH CORONARY ANGIOGRAM;  Surgeon: Marykay Lex, MD;  Location: [facility] CATH LAB;  Service: Cardiovascular;;   TONSILLECTOMY     TOTAL KNEE ARTHROPLASTY Left 12/31/2018   Procedure: TOTAL KNEE ARTHROPLASTY;  Surgeon: Ollen Gross, MD;  Location: [facility] ORS;  Service: Orthopedics;  Laterality: Left;    VASECTOMY      Social History   Socioeconomic History   Marital status: Married    Spouse name: Frederick Rush   Number of children: 2   Years of education: Associates   Highest education level: Not on file  Occupational History   Occupation: BULK DRIVER    Employer: [employer]    Comment: Retired   Occupation: Freight Liner-Part time  Tobacco Use   Smoking status: Former    Types: Cigarettes    Quit date: 07/16/1998    Years since quitting: 23.9   Smokeless tobacco: Never  Vaping Use   Vaping Use: Never used  Substance and Sexual Activity   Alcohol use: Yes    Alcohol/week: 5.0 standard drinks of alcohol    Types: 5 Cans of beer per  week   Drug use: No   Sexual activity: Yes    Partners: Female    Birth control/protection: None, Post-menopausal  Other Topics Concern   Not on file  Social History Narrative   Marital status: married x 39 years      Children:  2 children; 3 grandchildren; no gg      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 Currently working part time for [employer]       Lives: Lives with his wife and their pets.      Tobacco:  None; quit in 2000; smoked x 15 years + 20 years = 35 years.      Alcohol:    5 beers a week       Exercise: mows yard.      Seatbelt: 100%; no texting   Social Determinants of Corporate investment banker Strain: Not on file  Food Insecurity: Not on file  Transportation Needs: Not on  file  Physical Activity: Not on file  Stress: Not on file  Social Connections: Not on file  Intimate Partner Violence: Not on file   Family History  Problem Relation Age of Onset   Cancer Mother        Breast cancer; leukemia   Heart disease Mother 37       CABG   Hypertension Brother     Current Outpatient Medications  Medication Sig Dispense Refill   albuterol (VENTOLIN HFA) 108 (90 Base) MCG/ACT inhaler INHALE 2 PUFFS INTO THE LUNGS EVERY 6 HOURS AS NEEDED FOR WHEEZING OR SHORTNESS OF BREATH 18 g 2   amLODipine (NORVASC) 10 MG tablet Take 1 tablet (10 mg total) by mouth daily. 90 tablet 3   baclofen (LIORESAL) 10 MG tablet Take 1 tablet (10 mg total) by mouth 3 (three) times daily. 30 each 0   dicyclomine (BENTYL) 20 MG tablet Take 20 mg by mouth 3 (three) times daily.     fluticasone (FLONASE) 50 MCG/ACT nasal spray Place 1 spray into both nostrils daily.      lisinopril (ZESTRIL) 20 MG tablet Take 1 tablet (20 mg total) by mouth daily. 90 tablet 4   meloxicam (MOBIC) 15 MG tablet TAKE 1 TABLET(15 MG) BY MOUTH DAILY AS NEEDED FOR PAIN 90 tablet 1   mesalamine (APRISO) 0.375 g 24 hr capsule Take 4 capsules (1.5 g total) by mouth every morning. 1500mg =4 capsules (Patient taking differently: Take 1,500 mg by mouth daily at 2 am. 1500mg =4 capsules) 360 capsule 1   rosuvastatin (CRESTOR) 40 MG tablet Take 1 tablet (40 mg total) by mouth daily. 90 tablet 4   No current facility-administered medications for this visit.    Allergies  Allergen Reactions   Latex     Pt stated that latex causes itching.   Gabapentin Rash     REVIEW OF SYSTEMS:  [X]  denotes positive finding, [ ]  denotes negative finding Cardiac  Comments:  Chest pain or chest pressure:    Shortness of breath upon exertion:    Short of breath when lying flat:    Irregular heart rhythm:        Vascular    Pain in calf, thigh, or hip brought on by ambulation:    Pain in feet at night that wakes you up from your  sleep:     Blood clot in your veins:    Leg swelling:  Pulmonary    Oxygen at home:    Productive cough:     Wheezing:         Neurologic    Sudden weakness in arms or legs:     Sudden numbness in arms or legs:     Sudden onset of difficulty speaking or slurred speech:    Temporary loss of vision in one eye:     Problems with dizziness:         Gastrointestinal    Blood in stool:     Vomited blood:         Genitourinary    Burning when urinating:     Blood in urine:        Psychiatric    Major depression:         Hematologic    Bleeding problems:    Problems with blood clotting too easily:        Skin    Rashes or ulcers:        Constitutional    Fever or chills:      PHYSICAL EXAMINATION:  There were no vitals filed for this visit.  General:  WDWN in NAD; vital signs documented above Gait: Not observed HENT: WNL, normocephalic Pulmonary: normal non-labored breathing , without wheezing Cardiac: regular HR Abdomen: soft, NT, no masses Skin: without rashes Vascular Exam/Pulses:  Right Left  Radial 2+ (normal) 2+ (normal)  Ulnar    Femoral    Popliteal    DP 2+ (normal) 2+ (normal)  PT     Extremities: without ischemic changes, without Gangrene , without cellulitis; without open wounds;  Musculoskeletal: no muscle wasting or atrophy  Neurologic: A&O X 3;  No focal weakness or paresthesias are detected Psychiatric:  The pt has Normal affect.   Non-Invasive Vascular Imaging:    IMPRESSION: 1. There is moderate and eccentric atherosclerotic plaque of the left carotid circulation. While this results in less than 50% stenosis by Doppler criteria, the morphology of this plaque raises concern for possible thromboembolic phenomena (see key images) based on provided history. Referral to a vascular surgeon may be of benefit. 2. Minimal atherosclerotic plaque of the right carotid circulation results in less than 50% stenosis by Doppler criteria. 3.  Bilateral vertebral arteries demonstrate normal antegrade flow.    ASSESSMENT/PLAN: Kohlten Manieri is a 72 y.o. male presenting with left eye ocular symptoms that are not consistent with typical amaurosis.  Initial symptoms occurred in bilateral eyes, and while not consistent with symptomatic unilateral carotid artery stenosis, there was sensory deficit marked by vision loss in the center of the eye.  Interestingly, since that time vision loss has been only appreciated in the left eye, but even still, symptoms are not consistent with amaurosis or white light amaurosis.  Frederick Rush describes blurry vision, that abates with washing his face.  Per Frederick Rush, his ophthalmologist Dr. Tiburcio Pea did not see any issues in his eye.  I plan to call Dr. Tiburcio Pea to discuss his findings -I specifically plan to ask about Hollenhorst plaque.   Being that there is a lesion, that appears somewhat high risk, I have ordered a CT scan of the head and neck to further evaluate the left carotid artery.  I plan to see Frederick Rush back in 3 weeks after I have had time to discuss his case with Dr. Tiburcio Pea. Should there be no other etiology, and the carotid artery has greater than 50% stenosis, we would discuss the role of cerebrovascular revascularization.  Asked that  he continue taking his statin medicine and initiate 81 mg ASA.  He was asked to call my office should any questions or concerns arise.   Victorino Sparrow, MD [facility] [phone]

## 2022-06-24 ENCOUNTER — Encounter: Payer: Self-pay | Admitting: Vascular Surgery

## 2022-06-24 ENCOUNTER — Ambulatory Visit: Payer: Medicare Other | Admitting: Vascular Surgery

## 2022-06-24 VITALS — BP 155/71 | HR 52 | Temp 98.0°F | Resp 20 | Ht 67.0 in | Wt 182.0 lb

## 2022-06-24 DIAGNOSIS — G453 Amaurosis fugax: Secondary | ICD-10-CM

## 2022-06-28 ENCOUNTER — Other Ambulatory Visit: Payer: Self-pay

## 2022-06-28 DIAGNOSIS — G453 Amaurosis fugax: Secondary | ICD-10-CM

## 2022-06-30 ENCOUNTER — Other Ambulatory Visit: Payer: Self-pay | Admitting: Cardiovascular Disease

## 2022-06-30 DIAGNOSIS — I2510 Atherosclerotic heart disease of native coronary artery without angina pectoris: Secondary | ICD-10-CM

## 2022-07-08 ENCOUNTER — Other Ambulatory Visit: Payer: Medicare Other

## 2022-07-09 ENCOUNTER — Other Ambulatory Visit: Payer: Self-pay | Admitting: Cardiovascular Disease

## 2022-07-11 ENCOUNTER — Other Ambulatory Visit: Payer: Medicare Other

## 2022-07-11 ENCOUNTER — Ambulatory Visit (INDEPENDENT_AMBULATORY_CARE_PROVIDER_SITE_OTHER): Payer: Medicare Other

## 2022-07-11 DIAGNOSIS — C44619 Basal cell carcinoma of skin of left upper limb, including shoulder: Secondary | ICD-10-CM | POA: Diagnosis not present

## 2022-07-11 DIAGNOSIS — G453 Amaurosis fugax: Secondary | ICD-10-CM

## 2022-07-11 DIAGNOSIS — Z79899 Other long term (current) drug therapy: Secondary | ICD-10-CM | POA: Diagnosis not present

## 2022-07-11 DIAGNOSIS — Z85828 Personal history of other malignant neoplasm of skin: Secondary | ICD-10-CM | POA: Diagnosis not present

## 2022-07-11 DIAGNOSIS — I6523 Occlusion and stenosis of bilateral carotid arteries: Secondary | ICD-10-CM | POA: Diagnosis not present

## 2022-07-11 DIAGNOSIS — D225 Melanocytic nevi of trunk: Secondary | ICD-10-CM | POA: Diagnosis not present

## 2022-07-11 DIAGNOSIS — L570 Actinic keratosis: Secondary | ICD-10-CM | POA: Diagnosis not present

## 2022-07-11 DIAGNOSIS — L814 Other melanin hyperpigmentation: Secondary | ICD-10-CM | POA: Diagnosis not present

## 2022-07-11 DIAGNOSIS — D485 Neoplasm of uncertain behavior of skin: Secondary | ICD-10-CM | POA: Diagnosis not present

## 2022-07-11 DIAGNOSIS — B351 Tinea unguium: Secondary | ICD-10-CM | POA: Diagnosis not present

## 2022-07-11 LAB — I-STAT CREATININE (MANUAL ENTRY): Creatinine, Ser: 1.1 (ref 0.50–1.10)

## 2022-07-11 MED ORDER — IOHEXOL 350 MG/ML SOLN
100.0000 mL | Freq: Once | INTRAVENOUS | Status: AC | PRN
  Administered 2022-07-11: 75 mL via INTRAVENOUS

## 2022-07-14 NOTE — Progress Notes (Deleted)
Office Note     CC: Concern for amaurosis with left carotid lesion Requesting Provider:  Agapito Rush, *  HPI: Frederick Rush is a 72 y.o. (05/22/1950) male presenting at the request of Dr. Nanetta Batty for concern for left eye amaurosis.  On exam today, Frederick Rush was doing well.  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  In retirement, he continues to work part-time driving trucks.  Frederick Rush is been married to his wife for nearly 45 years, and has 2 children.  Several months ago, he had a heated argument with his boss, and stated that in the following days, he appreciated blindness in bilateral eyes, specifically in the middle of the eye.  He could see around black spot that was located in the middle of his vision.  He washed his face, and the black spot resolved.  Since that time, he has had 3 episodes of blurry vision appreciated in the middle of his vision, described as a square within his field of vision.  This is not black, nor white.  He states that after drinking his coffee, he washes face, and the lesion resolves.  There is no accompanying weakness, sensory deficit.  He has had episodes of headache, which is periorbital, which she thinks is either stress or allergy related.  The pt is  on a statin for cholesterol management.  The pt is not on a daily aspirin.   Other AC:  - The pt is  on medication for hypertension.   The pt is not diabetic.  Tobacco hx:  -  Past Medical History:  Diagnosis Date   Arthritis    CAD (coronary artery disease), non obstructive by cath 07/19/11, treat medically 07/19/2011   Crohn disease (HCC)    Hypercholesterolemia    Hyperlipidemia    Myocardial infarction [facility])    Skin cancer 2006   Left shoulder; followed every six months.      Past Surgical History:  Procedure Laterality Date   APPENDECTOMY     CARDIAC CATHETERIZATION  07/15/2011   Medical management   ELBOW SURGERY Right     FRACTURE SURGERY Right 2015   elbow   HERNIA REPAIR     KNEE SURGERY     arthroscopy   LEFT HEART CATHETERIZATION WITH CORONARY ANGIOGRAM  07/19/2011   Procedure: LEFT HEART CATHETERIZATION WITH CORONARY ANGIOGRAM;  Surgeon: Marykay Lex, MD;  Location: [facility] CATH LAB;  Service: Cardiovascular;;   TONSILLECTOMY     TOTAL KNEE ARTHROPLASTY Left 12/31/2018   Procedure: TOTAL KNEE ARTHROPLASTY;  Surgeon: Ollen Gross, MD;  Location: [facility] ORS;  Service: Orthopedics;  Laterality: Left;    VASECTOMY      Social History   Socioeconomic History   Marital status: Married    Spouse name: Zella Ball   Number of children: 2   Years of education: Associates   Highest education level: Not on file  Occupational History   Occupation: BULK DRIVER    Employer: [employer]    Comment: Retired   Occupation: Freight Liner-Part time  Tobacco Use   Smoking status: Former    Types: Cigarettes    Quit date: 07/16/1998    Years since quitting: 24.0   Smokeless tobacco: Never  Vaping Use   Vaping Use: Never used  Substance and Sexual Activity   Alcohol use: Yes    Alcohol/week: 5.0 standard drinks of alcohol    Types: 5 Cans of beer per  week   Drug use: No   Sexual activity: Yes    Partners: Female    Birth control/protection: None, Post-menopausal  Other Topics Concern   Not on file  Social History Narrative   Marital status: married x 39 years      Children:  2 children; 3 grandchildren; no gg      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 Currently working part time for [employer]       Lives: Lives with his wife and their pets.      Tobacco:  None; quit in 2000; smoked x 15 years + 20 years = 35 years.      Alcohol:    5 beers a week       Exercise: mows yard.      Seatbelt: 100%; no texting   Social Determinants of Corporate investment banker Strain: Not on file  Food Insecurity: Not on file  Transportation Needs: Not on  file  Physical Activity: Not on file  Stress: Not on file  Social Connections: Not on file  Intimate Partner Violence: Not on file   Family History  Problem Relation Age of Onset   Cancer Mother        Breast cancer; leukemia   Heart disease Mother 34       CABG   Hypertension Brother     Current Outpatient Medications  Medication Sig Dispense Refill   albuterol (VENTOLIN HFA) 108 (90 Base) MCG/ACT inhaler INHALE 2 PUFFS INTO THE LUNGS EVERY 6 HOURS AS NEEDED FOR WHEEZING OR SHORTNESS OF BREATH 18 g 2   amLODipine (NORVASC) 10 MG tablet Take 1 tablet (10 mg total) by mouth daily. 90 tablet 3   baclofen (LIORESAL) 10 MG tablet Take 1 tablet (10 mg total) by mouth 3 (three) times daily. 30 each 0   dicyclomine (BENTYL) 20 MG tablet Take 20 mg by mouth 3 (three) times daily.     fluticasone (FLONASE) 50 MCG/ACT nasal spray Place 1 spray into both nostrils daily.      lisinopril (ZESTRIL) 20 MG tablet TAKE 1 TABLET(20 MG) BY MOUTH DAILY 90 tablet 4   meloxicam (MOBIC) 15 MG tablet TAKE 1 TABLET(15 MG) BY MOUTH DAILY AS NEEDED FOR PAIN 90 tablet 1   mesalamine (APRISO) 0.375 g 24 hr capsule Take 4 capsules (1.5 g total) by mouth every morning. 1500mg =4 capsules (Patient taking differently: Take 1,500 mg by mouth daily at 2 am. 1500mg =4 capsules) 360 capsule 1   rosuvastatin (CRESTOR) 40 MG tablet TAKE 1 TABLET(40 MG) BY MOUTH DAILY 90 tablet 4   No current facility-administered medications for this visit.    Allergies  Allergen Reactions   Latex     Pt stated that latex causes itching.   Gabapentin Rash     REVIEW OF SYSTEMS:  [X]  denotes positive finding, [ ]  denotes negative finding Cardiac  Comments:  Chest pain or chest pressure:    Shortness of breath upon exertion:    Short of breath when lying flat:    Irregular heart rhythm:        Vascular    Pain in calf, thigh, or hip brought on by ambulation:    Pain in feet at night that wakes you up from your sleep:     Blood  clot in your veins:    Leg swelling:         Pulmonary  Oxygen at home:    Productive cough:     Wheezing:         Neurologic    Sudden weakness in arms or legs:     Sudden numbness in arms or legs:     Sudden onset of difficulty speaking or slurred speech:    Temporary loss of vision in one eye:     Problems with dizziness:         Gastrointestinal    Blood in stool:     Vomited blood:         Genitourinary    Burning when urinating:     Blood in urine:        Psychiatric    Major depression:         Hematologic    Bleeding problems:    Problems with blood clotting too easily:        Skin    Rashes or ulcers:        Constitutional    Fever or chills:      PHYSICAL EXAMINATION:  There were no vitals filed for this visit.  General:  WDWN in NAD; vital signs documented above Gait: Not observed HENT: WNL, normocephalic Pulmonary: normal non-labored breathing , without wheezing Cardiac: regular HR Abdomen: soft, NT, no masses Skin: without rashes Vascular Exam/Pulses:  Right Left  Radial 2+ (normal) 2+ (normal)  Ulnar    Femoral    Popliteal    DP 2+ (normal) 2+ (normal)  PT     Extremities: without ischemic changes, without Gangrene , without cellulitis; without open wounds;  Musculoskeletal: no muscle wasting or atrophy  Neurologic: A&O X 3;  No focal weakness or paresthesias are detected Psychiatric:  The pt has Normal affect.   Non-Invasive Vascular Imaging:    IMPRESSION: 1. There is moderate and eccentric atherosclerotic plaque of the left carotid circulation. While this results in less than 50% stenosis by Doppler criteria, the morphology of this plaque raises concern for possible thromboembolic phenomena (see key images) based on provided history. Referral to a vascular surgeon may be of benefit. 2. Minimal atherosclerotic plaque of the right carotid circulation results in less than 50% stenosis by Doppler criteria. 3. Bilateral  vertebral arteries demonstrate normal antegrade flow.    ASSESSMENT/PLAN: Frederick Rush is a 72 y.o. male presenting with left eye ocular symptoms that are not consistent with typical amaurosis.  Initial symptoms occurred in bilateral eyes, and while not consistent with symptomatic unilateral carotid artery stenosis, there was sensory deficit marked by vision loss in the center of the eye.  Interestingly, since that time vision loss has been only appreciated in the left eye, but even still, symptoms are not consistent with amaurosis or white light amaurosis.  Frederick Rush describes blurry vision, that abates with washing his face.  Per Frederick Rush, his ophthalmologist Dr. Tiburcio Pea did not see any issues in his eye.  I plan to call Dr. Tiburcio Pea to discuss his findings -I specifically plan to ask about Hollenhorst plaque.   Being that there is a lesion, that appears somewhat high risk, I have ordered a CT scan of the head and neck to further evaluate the left carotid artery.  I plan to see Frederick Rush back in 3 weeks after I have had time to discuss his case with Dr. Tiburcio Pea. Should there be no other etiology, and the carotid artery has greater than 50% stenosis, we would discuss the role of cerebrovascular revascularization.  Asked that he continue taking his  statin medicine and initiate 81 mg ASA.  He was asked to call my office should any questions or concerns arise.   Victorino Sparrow, MD [facility] [phone]

## 2022-07-15 ENCOUNTER — Ambulatory Visit: Payer: Medicare Other | Admitting: Vascular Surgery

## 2022-08-05 ENCOUNTER — Ambulatory Visit: Payer: Medicare Other | Admitting: Vascular Surgery

## 2022-08-05 ENCOUNTER — Encounter: Payer: Self-pay | Admitting: Vascular Surgery

## 2022-08-05 VITALS — BP 136/76 | HR 54 | Temp 98.2°F | Resp 20 | Ht 67.0 in | Wt 182.0 lb

## 2022-08-05 DIAGNOSIS — Z8669 Personal history of other diseases of the nervous system and sense organs: Secondary | ICD-10-CM | POA: Diagnosis not present

## 2022-08-05 NOTE — Progress Notes (Signed)
Office Note    HPI: Frederick Rush is a 72 y.o. (02-Jun-1950) male presenting in follow-up after initial consultation for possible left eye amaurosis.  On exam, Frederick Rush was doing well. 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  In retirement, he continues to work part-time driving trucks.  Frederick Rush is been married to his wife for nearly 45 years, and has 2 children.  Frederick Rush has had no further vision changes since the initial incident several months ago.  To review, he stated months ago, he had a heated argument with his boss, and in the following days, he appreciated blindness in bilateral eyes, specifically in the middle of the eye.  He could see around black spot that was located in the middle of his vision.  He washed his face, and the black spot resolved.  Was no accompanying weakness, sensory deficit.  Since that time, he has had 3 episodes of blurry vision, no vision loss.   This is not black, nor white.  He states that after drinking his coffee, he washes face, and the lesion resolves.  Had no further episodes since he was seen last in clinic.  He was initially seen by his optometrist Dr. Tiburcio Pea and was found to have no ophthalmic issues.  No Hollenhorst plaques.  That it affected his eyes bilaterally, Dr. Tiburcio Pea questioned whether this was due to hypotension.   The pt is  on a statin for cholesterol management.  The pt is not on a daily aspirin.   Other AC:  - The pt is  on medication for hypertension.   The pt is not diabetic.  Tobacco hx:  -  Past Medical History:  Diagnosis Date   Arthritis    CAD (coronary artery disease), non obstructive by cath 07/19/11, treat medically 07/19/2011   Crohn disease (HCC)    Hypercholesterolemia    Hyperlipidemia    Myocardial infarction [facility])    Skin cancer 2006   Left shoulder; followed every six months.      Past Surgical History:  Procedure Laterality Date   APPENDECTOMY     CARDIAC  CATHETERIZATION  07/15/2011   Medical management   ELBOW SURGERY Right    FRACTURE SURGERY Right 2015   elbow   HERNIA REPAIR     KNEE SURGERY     arthroscopy   LEFT HEART CATHETERIZATION WITH CORONARY ANGIOGRAM  07/19/2011   Procedure: LEFT HEART CATHETERIZATION WITH CORONARY ANGIOGRAM;  Surgeon: Marykay Lex, MD;  Location: [facility] CATH LAB;  Service: Cardiovascular;;   TONSILLECTOMY     TOTAL KNEE ARTHROPLASTY Left 12/31/2018   Procedure: TOTAL KNEE ARTHROPLASTY;  Surgeon: Ollen Gross, MD;  Location: [facility] ORS;  Service: Orthopedics;  Laterality: Left;    VASECTOMY      Social History   Socioeconomic History   Marital status: Married    Spouse name: Frederick Rush   Number of children: 2   Years of education: Associates   Highest education level: Not on file  Occupational History   Occupation: BULK DRIVER    Employer: [employer]    Comment: Retired   Occupation: Freight Liner-Part time  Tobacco Use   Smoking status: Former    Types: Cigarettes    Quit date: 07/16/1998    Years since quitting: 24.0   Smokeless tobacco: Never  Vaping Use   Vaping Use: Never used  Substance and Sexual Activity   Alcohol use: Yes    Alcohol/week:  5.0 standard drinks of alcohol    Types: 5 Cans of beer per week   Drug use: No   Sexual activity: Yes    Partners: Female    Birth control/protection: None, Post-menopausal  Other Topics Concern   Not on file  Social History Narrative   Marital status: married x 39 years      Children:  2 children; 3 grandchildren; no gg      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 Currently working part time for [employer]       Lives: Lives with his wife and their pets.      Tobacco:  None; quit in 2000; smoked x 15 years + 20 years = 35 years.      Alcohol:    5 beers a week       Exercise: mows yard.      Seatbelt: 100%; no texting   Social Determinants of Corporate investment banker Strain:  Not on file  Food Insecurity: Not on file  Transportation Needs: Not on file  Physical Activity: Not on file  Stress: Not on file  Social Connections: Not on file  Intimate Partner Violence: Not on file   Family History  Problem Relation Age of Onset   Cancer Mother        Breast cancer; leukemia   Heart disease Mother 68       CABG   Hypertension Brother     Current Outpatient Medications  Medication Sig Dispense Refill   albuterol (VENTOLIN HFA) 108 (90 Base) MCG/ACT inhaler INHALE 2 PUFFS INTO THE LUNGS EVERY 6 HOURS AS NEEDED FOR WHEEZING OR SHORTNESS OF BREATH 18 g 2   amLODipine (NORVASC) 10 MG tablet Take 1 tablet (10 mg total) by mouth daily. 90 tablet 3   baclofen (LIORESAL) 10 MG tablet Take 1 tablet (10 mg total) by mouth 3 (three) times daily. 30 each 0   dicyclomine (BENTYL) 20 MG tablet Take 20 mg by mouth 3 (three) times daily.     fluticasone (FLONASE) 50 MCG/ACT nasal spray Place 1 spray into both nostrils daily.      lisinopril (ZESTRIL) 20 MG tablet TAKE 1 TABLET(20 MG) BY MOUTH DAILY 90 tablet 4   meloxicam (MOBIC) 15 MG tablet TAKE 1 TABLET(15 MG) BY MOUTH DAILY AS NEEDED FOR PAIN 90 tablet 1   mesalamine (APRISO) 0.375 g 24 hr capsule Take 4 capsules (1.5 g total) by mouth every morning. 1500mg =4 capsules (Patient taking differently: Take 1,500 mg by mouth daily at 2 am. 1500mg =4 capsules) 360 capsule 1   rosuvastatin (CRESTOR) 40 MG tablet TAKE 1 TABLET(40 MG) BY MOUTH DAILY 90 tablet 4   No current facility-administered medications for this visit.    Allergies  Allergen Reactions   Latex     Pt stated that latex causes itching.   Gabapentin Rash     REVIEW OF SYSTEMS:  [X]  denotes positive finding, [ ]  denotes negative finding Cardiac  Comments:  Chest pain or chest pressure:    Shortness of breath upon exertion:    Short of breath when lying flat:    Irregular heart rhythm:        Vascular    Pain in calf, thigh, or hip brought on by  ambulation:    Pain in feet at night that wakes you up from your sleep:     Blood clot in your veins:  Leg swelling:         Pulmonary    Oxygen at home:    Productive cough:     Wheezing:         Neurologic    Sudden weakness in arms or legs:     Sudden numbness in arms or legs:     Sudden onset of difficulty speaking or slurred speech:    Temporary loss of vision in one eye:     Problems with dizziness:         Gastrointestinal    Blood in stool:     Vomited blood:         Genitourinary    Burning when urinating:     Blood in urine:        Psychiatric    Major depression:         Hematologic    Bleeding problems:    Problems with blood clotting too easily:        Skin    Rashes or ulcers:        Constitutional    Fever or chills:      PHYSICAL EXAMINATION:  Vitals:   08/05/22 1007 08/05/22 1009  BP: 133/75 136/76  Pulse: (!) 54   Resp: 20   Temp: 98.2 F (36.8 C)   SpO2: 94%   Weight: 182 lb (82.6 kg)   Height: 5\' 7"  (1.702 m)     General:  WDWN in NAD; vital signs documented above Gait: Not observed HENT: WNL, normocephalic Pulmonary: normal non-labored breathing , without wheezing Cardiac: regular HR Abdomen: soft, NT, no masses Skin: without rashes Vascular Exam/Pulses:  Right Left  Radial 2+ (normal) 2+ (normal)  Ulnar    Femoral    Popliteal    DP 2+ (normal) 2+ (normal)  PT     Extremities: without ischemic changes, without Gangrene , without cellulitis; without open wounds;  Musculoskeletal: no muscle wasting or atrophy  Neurologic: A&O X 3;  No focal weakness or paresthesias are detected Psychiatric:  The pt has Normal affect.   Non-Invasive Vascular Imaging:    IMPRESSION: 1. There is moderate and eccentric atherosclerotic plaque of the left carotid circulation. While this results in less than 50% stenosis by Doppler criteria, the morphology of this plaque raises concern for possible thromboembolic phenomena (see key  images) based on provided history. Referral to a vascular surgeon may be of benefit. 2. Minimal atherosclerotic plaque of the right carotid circulation results in less than 50% stenosis by Doppler criteria. 3. Bilateral vertebral arteries demonstrate normal antegrade flow.  ____________________________  Right carotid system: Atherosclerosis at the carotid bifurcation without greater than 50% stenosis.   Left carotid system: Atherosclerosis at the carotid bifurcation without greater than 50% stenosis.   Vertebral arteries: Left dominant. No evidence of dissection, stenosis (50% or greater), or occlusion. Mild narrowing of the vertebral artery origins due to calcific atherosclerosis.    ASSESSMENT/PLAN: Kalex Klontz is a 72 y.o. male presenting with left eye ocular symptoms that are not consistent with typical amaurosis.  Since seen last, I was able to reach out to Dr. Tiburcio Pea.  This was very beneficial as symptoms at the time of presentation were not consistent with amaurosis.  He also did not appreciate any Hollenhorst plaque on ophthalmic exam.  Carotid ultrasound demonstrated less than 50% stenosis bilaterally.  This was confirmed with CT angio head and neck.  The lesion appreciated in the left carotid on ultrasound was non-- flow-limiting, and was  not significant on CT.  Furthermore, Frederick Rush's initial symptoms were bilateral.  I do not think that to cerebral revascularization is warranted, as I do not think that he had bilateral amaurosis from carotid disease.  I plan is to see him on a yearly basis with repeat carotid studies.  Should he have episodes again, I asked him to present to the ED immediately.  I asked that he continue taking his aspirin and statin medication.   I plan to share this note with Dr. Allyson Sabal, his cardiologist.   Frederick Sparrow, MD [facility] [phone]

## 2022-08-17 ENCOUNTER — Other Ambulatory Visit: Payer: Self-pay

## 2022-08-17 DIAGNOSIS — G453 Amaurosis fugax: Secondary | ICD-10-CM

## 2022-08-17 DIAGNOSIS — Z8669 Personal history of other diseases of the nervous system and sense organs: Secondary | ICD-10-CM

## 2022-08-22 DIAGNOSIS — Z79899 Other long term (current) drug therapy: Secondary | ICD-10-CM | POA: Diagnosis not present

## 2022-09-15 ENCOUNTER — Ambulatory Visit: Payer: Medicare Other | Admitting: Family Medicine

## 2022-10-10 DIAGNOSIS — Z85828 Personal history of other malignant neoplasm of skin: Secondary | ICD-10-CM | POA: Diagnosis not present

## 2022-10-10 DIAGNOSIS — L2089 Other atopic dermatitis: Secondary | ICD-10-CM | POA: Diagnosis not present

## 2022-10-11 ENCOUNTER — Encounter: Payer: Self-pay | Admitting: Family Medicine

## 2022-10-11 ENCOUNTER — Ambulatory Visit (INDEPENDENT_AMBULATORY_CARE_PROVIDER_SITE_OTHER): Payer: Medicare Other | Admitting: Family Medicine

## 2022-10-11 ENCOUNTER — Ambulatory Visit (INDEPENDENT_AMBULATORY_CARE_PROVIDER_SITE_OTHER): Payer: Medicare Other

## 2022-10-11 VITALS — BP 127/57 | HR 62 | Ht 67.0 in | Wt 175.0 lb

## 2022-10-11 DIAGNOSIS — M359 Systemic involvement of connective tissue, unspecified: Secondary | ICD-10-CM | POA: Diagnosis not present

## 2022-10-11 DIAGNOSIS — R0602 Shortness of breath: Secondary | ICD-10-CM

## 2022-10-11 DIAGNOSIS — M79671 Pain in right foot: Secondary | ICD-10-CM

## 2022-10-11 DIAGNOSIS — I2510 Atherosclerotic heart disease of native coronary artery without angina pectoris: Secondary | ICD-10-CM

## 2022-10-11 DIAGNOSIS — E782 Mixed hyperlipidemia: Secondary | ICD-10-CM

## 2022-10-11 DIAGNOSIS — I771 Stricture of artery: Secondary | ICD-10-CM | POA: Diagnosis not present

## 2022-10-11 DIAGNOSIS — R7301 Impaired fasting glucose: Secondary | ICD-10-CM | POA: Diagnosis not present

## 2022-10-11 DIAGNOSIS — M19042 Primary osteoarthritis, left hand: Secondary | ICD-10-CM

## 2022-10-11 DIAGNOSIS — R918 Other nonspecific abnormal finding of lung field: Secondary | ICD-10-CM | POA: Diagnosis not present

## 2022-10-11 DIAGNOSIS — M19041 Primary osteoarthritis, right hand: Secondary | ICD-10-CM

## 2022-10-11 DIAGNOSIS — R062 Wheezing: Secondary | ICD-10-CM | POA: Diagnosis not present

## 2022-10-11 DIAGNOSIS — M79672 Pain in left foot: Secondary | ICD-10-CM | POA: Diagnosis not present

## 2022-10-11 DIAGNOSIS — I10 Essential (primary) hypertension: Secondary | ICD-10-CM

## 2022-10-11 DIAGNOSIS — Z125 Encounter for screening for malignant neoplasm of prostate: Secondary | ICD-10-CM

## 2022-10-11 DIAGNOSIS — R252 Cramp and spasm: Secondary | ICD-10-CM

## 2022-10-11 MED ORDER — MELOXICAM 15 MG PO TABS: ORAL_TABLET | ORAL | 1 refills | Status: AC

## 2022-10-11 NOTE — Progress Notes (Signed)
Established Patient Office Visit  Subjective   Patient ID: Frederick Rush, male    DOB: November 14, 1950  Age: 72 y.o. MRN: [id number]  Chief Complaint  Patient presents with   Hypertension    HPI  Hypertension- Pt denies chest pain, SOB, dizziness, or heart palpitations.  Taking meds as directed w/o problems.  Denies medication side effects.    Impaired fasting glucose-no increased thirst or urination. No symptoms consistent with hypoglycemia.  Frederick Rush also reports that when he was up [place] on vacation that several family members had pointed out that he was breathing heavily and even noted some wheezing intermittently.  He said he was not taking his allergy pill daily they are like he normally does down here but that is because he really did not feel like he was having a lot of allergy symptoms.  He denies any chest pain around that time.  He did not use any kind albuterol product.  No prior history of asthma.  He has been having more hand and leg cramps than usual lately.      ROS    Objective:     BP (!) 127/57   Pulse 62   Ht 5\' 7"  (1.702 m)   Wt 175 lb (79.4 kg)   SpO2 98%   BMI 27.41 kg/m     Physical Exam Constitutional:      Appearance: Normal appearance. He is well-developed.  HENT:     Head: Normocephalic and atraumatic.  Eyes:     Conjunctiva/sclera: Conjunctivae normal.  Cardiovascular:     Rate and Rhythm: Normal rate and regular rhythm.     Heart sounds: Normal heart sounds.  Pulmonary:     Effort: Pulmonary effort is normal.     Breath sounds: Normal breath sounds.  Skin:    General: Skin is warm and dry.  Neurological:     Mental Status: He is alert and oriented to person, place, and time.  Psychiatric:        Behavior: Behavior normal.     No results found for any visits on 10/11/22.     The ASCVD Risk score (Arnett DK, et al., 2019) failed to calculate for the following reasons:   The valid total cholesterol range is 130 to 320  mg/dL    Assessment & Plan:   Problem List Items Addressed This Visit       Cardiovascular and Mediastinum   CAD (coronary artery disease), non obstructive by cath 07/19/11, treat medically (Chronic)    No Recent chest pain or shortness of breath.      Essential hypertension - Primary   Relevant Orders   CMP14+EGFR   Lipid panel     Endocrine   IFG (impaired fasting glucose)    Due to recheck A1c.  He is actually done great in the last couple years so at this point we are just keeping an eye on it yearly.      Relevant Orders   CMP14+EGFR   Lipid panel   Hemoglobin A1c     Musculoskeletal and Integument   Osteoarthritis of both hands   Relevant Medications   meloxicam (MOBIC) 15 MG tablet     Other   Hyperlipidemia    Due to recheck lipids.      Relevant Orders   CMP14+EGFR   Lipid panel   Diffuse connective tissue disease (HCC)    He would like a refill on his meloxicam today if okay to do so.  Relevant Medications   meloxicam (MOBIC) 15 MG tablet   Other Visit Diagnoses     Screening for prostate cancer       Relevant Orders   PSA   Muscle cramping       Relevant Orders   CMP14+EGFR   Lipid panel   PSA   Hemoglobin A1c   Magnesium   Bilateral foot pain       Relevant Orders   Ambulatory referral to Podiatry   SOB (shortness of breath)       Relevant Orders   DG Chest 2 View       Muscle cramps - check for abnormal mag and potassium levels.    SOB - clear on exam today. Will get CXR. Last on file was 2017.  No assoc CP.  Consider spirometry if sxs persist.    Return for Hypertension.    Frederick Gasser, MD

## 2022-10-11 NOTE — Assessment & Plan Note (Signed)
Due to recheck A1c.  He is actually done great in the last couple years so at this point we are just keeping an eye on it yearly.

## 2022-10-11 NOTE — Assessment & Plan Note (Signed)
Due to recheck lipids. 

## 2022-10-11 NOTE — Assessment & Plan Note (Signed)
He would like a refill on his meloxicam today if okay to do so.

## 2022-10-11 NOTE — Assessment & Plan Note (Signed)
Well controlled. Continue current regimen. Follow up in  6 mo  

## 2022-10-11 NOTE — Assessment & Plan Note (Signed)
No Recent chest pain or shortness of breath.

## 2022-10-12 LAB — CMP14+EGFR
ALT: 19 IU/L (ref 0–44)
AST: 20 IU/L (ref 0–40)
Albumin: 4.3 g/dL (ref 3.8–4.8)
Alkaline Phosphatase: 32 IU/L — ABNORMAL LOW (ref 44–121)
BUN/Creatinine Ratio: 17 (ref 10–24)
BUN: 15 mg/dL (ref 8–27)
Bilirubin Total: 0.3 mg/dL (ref 0.0–1.2)
CO2: 23 mmol/L (ref 20–29)
Calcium: 9.8 mg/dL (ref 8.6–10.2)
Chloride: 100 mmol/L (ref 96–106)
Creatinine, Ser: 0.9 mg/dL (ref 0.76–1.27)
Globulin, Total: 2.2 g/dL (ref 1.5–4.5)
Glucose: 98 mg/dL (ref 70–99)
Potassium: 4.4 mmol/L (ref 3.5–5.2)
Sodium: 139 mmol/L (ref 134–144)
Total Protein: 6.5 g/dL (ref 6.0–8.5)
eGFR: 90 mL/min/{1.73_m2} (ref >59)

## 2022-10-12 LAB — LIPID PANEL
Chol/HDL Ratio: 2.4 ratio (ref 0.0–5.0)
Cholesterol, Total: 112 mg/dL (ref 100–199)
HDL: 46 mg/dL (ref >39)
LDL Chol Calc (NIH): 47 mg/dL (ref 0–99)
Triglycerides: 98 mg/dL (ref 0–149)
VLDL Cholesterol Cal: 19 mg/dL (ref 5–40)

## 2022-10-12 LAB — MAGNESIUM: Magnesium: 2.1 mg/dL (ref 1.6–2.3)

## 2022-10-12 LAB — HEMOGLOBIN A1C
Est. average glucose Bld gHb Est-mCnc: 128 mg/dL
Hgb A1c MFr Bld: 6.1 % — ABNORMAL HIGH (ref 4.8–5.6)

## 2022-10-12 LAB — PSA: Prostate Specific Ag, Serum: 0.5 ng/mL (ref 0.0–4.0)

## 2022-10-13 NOTE — Progress Notes (Signed)
Hi Keith, metabolic panel overall looks good.  A1c has trended upward.  It had been looking great over the last couple of years but it is back up into the prediabetes range similar to her back in 2020.  It is now 6.1.  Please remember that 6.5 is full-blown diabetes just really encouraged her to work on cutting back on sweets and carbs and any kind of sweetened beverages.  Make sure you are staying active.  Magnesium is normal.  Cholesterol overall looks great.  And prostate test is normal.  Still would like to get you scheduled for a telephone call with her Medicare wellness nurse for the Medicare wellness exam.  It is really just a set of routine questions that Medicare wants [place] to ask all beneficiaries every year.  It only takes about 20 minutes and would like to get you scheduled with our nurse for that.

## 2022-10-14 ENCOUNTER — Other Ambulatory Visit: Payer: Self-pay

## 2022-10-14 ENCOUNTER — Ambulatory Visit: Payer: Medicare Other | Admitting: Podiatry

## 2022-10-14 ENCOUNTER — Ambulatory Visit: Payer: Medicare Other

## 2022-10-14 ENCOUNTER — Encounter: Payer: Self-pay | Admitting: Podiatry

## 2022-10-14 DIAGNOSIS — M79672 Pain in left foot: Secondary | ICD-10-CM | POA: Diagnosis not present

## 2022-10-14 DIAGNOSIS — M79671 Pain in right foot: Secondary | ICD-10-CM

## 2022-10-14 DIAGNOSIS — M7752 Other enthesopathy of left foot: Secondary | ICD-10-CM | POA: Diagnosis not present

## 2022-10-14 DIAGNOSIS — M2142 Flat foot [pes planus] (acquired), left foot: Secondary | ICD-10-CM

## 2022-10-14 DIAGNOSIS — M2011 Hallux valgus (acquired), right foot: Secondary | ICD-10-CM | POA: Diagnosis not present

## 2022-10-14 DIAGNOSIS — M2012 Hallux valgus (acquired), left foot: Secondary | ICD-10-CM | POA: Diagnosis not present

## 2022-10-14 DIAGNOSIS — M205X2 Other deformities of toe(s) (acquired), left foot: Secondary | ICD-10-CM | POA: Diagnosis not present

## 2022-10-14 DIAGNOSIS — M19072 Primary osteoarthritis, left ankle and foot: Secondary | ICD-10-CM | POA: Diagnosis not present

## 2022-10-14 DIAGNOSIS — M2141 Flat foot [pes planus] (acquired), right foot: Secondary | ICD-10-CM | POA: Diagnosis not present

## 2022-10-14 NOTE — Progress Notes (Signed)
  Subjective:  Patient ID: Frederick Rush, male    DOB: [birth year],   MRN: [id number]  Chief Complaint  Patient presents with   Foot Pain    Pt present today for bil foot pain.tingling sensation in both feet hurts when he walks.    72 y.o. male presents for concern of bilateral foot pain. Relates pain when walking in his arch and around his left great toe. Also relates burning and tingling. Does have a history of knee replacement and nerve damage in the left knee.  . Denies any other pedal complaints. Denies n/v/f/c.   Past Medical History:  Diagnosis Date   Arthritis    CAD (coronary artery disease), non obstructive by cath 07/19/11, treat medically 07/19/2011   Crohn disease (HCC)    Hypercholesterolemia    Hyperlipidemia    Myocardial infarction [facility])    Skin cancer 2006   Left shoulder; followed every six months.      Objective:  Physical Exam: Vascular: DP/PT pulses 1/4 bilateral. CFT <3 seconds. Normal hair growth on digits. No edema.  Skin. No lacerations or abrasions bilateral feet.  Musculoskeletal: MMT 5/5 bilateral lower extremities in DF, PF, Inversion and Eversion. Deceased ROM in DF of ankle joint. Bilateral HAV deformity more severe on right . Tender to first MPJ on left and pain with ROM of the joint. Limited ROM of the left hallux. Some pain to plantar heel and arch. Notable collapse of the medial arch.  Neurological: Sensation intact to light touch.   Assessment:   1. Bilateral pes planus   2. Hallux limitus, left      Plan:  Patient was evaluated and treated and all questions answered. -Xrays reviewed. Bilateral pes planus noted with collapes of the medial arch. Sever bunion deformity noted on right and bunion deformity on left with degenerative changes noted in the first MPJ. Hammered digits bilateral. Degenerative changes noted in the midfoot area.  -Discussed treatement options; discussed pes planus deformity;conservative and  surgical  -Rx Orthotics.  Patient to be fitted in [place].  -Recommend good supportive shoes -Recommend daily stretching and icing -Recommend continue with meloxicam.  -Patient to return to office as needed or sooner if condition worsens.   Louann Sjogren, DPM

## 2022-10-18 NOTE — Progress Notes (Signed)
Hi Frederick Rush,  Your CXR shows findings consistent with possible COPD and some inflammation in the bronchial tubes.  Would like to get you scheduled for spirometry here in our office for further testing of your lungs.

## 2022-11-01 ENCOUNTER — Ambulatory Visit: Payer: Medicare Other | Admitting: Family Medicine

## 2022-11-03 ENCOUNTER — Other Ambulatory Visit: Payer: Medicare Other

## 2022-11-04 ENCOUNTER — Ambulatory Visit (INDEPENDENT_AMBULATORY_CARE_PROVIDER_SITE_OTHER): Payer: Medicare Other

## 2022-11-04 DIAGNOSIS — M205X2 Other deformities of toe(s) (acquired), left foot: Secondary | ICD-10-CM | POA: Diagnosis not present

## 2022-11-04 DIAGNOSIS — M2142 Flat foot [pes planus] (acquired), left foot: Secondary | ICD-10-CM

## 2022-11-04 DIAGNOSIS — M79671 Pain in right foot: Secondary | ICD-10-CM

## 2022-11-04 DIAGNOSIS — M2141 Flat foot [pes planus] (acquired), right foot: Secondary | ICD-10-CM

## 2022-11-04 DIAGNOSIS — M79672 Pain in left foot: Secondary | ICD-10-CM

## 2022-11-04 NOTE — Progress Notes (Signed)
  Patient was seen, measured for custom molded foot orthotics patient has Pes Planus with over-pronation and pain  Patient will benefit from CFO's as they will help provide total contact to MLA's helping to better distribute body weight across BIL feet greater reducing plantar pressure and pain and to also encourage FF and RF alignment  Patient was scanned items to be ordered and fit when in   [employer], CFo, CFm

## 2022-11-07 ENCOUNTER — Ambulatory Visit (INDEPENDENT_AMBULATORY_CARE_PROVIDER_SITE_OTHER): Payer: Medicare Other | Admitting: Family Medicine

## 2022-11-07 ENCOUNTER — Encounter: Payer: Self-pay | Admitting: Family Medicine

## 2022-11-07 VITALS — BP 129/58 | HR 56 | Ht 67.0 in | Wt 184.0 lb

## 2022-11-07 DIAGNOSIS — Z23 Encounter for immunization: Secondary | ICD-10-CM | POA: Diagnosis not present

## 2022-11-07 DIAGNOSIS — M1991 Primary osteoarthritis, unspecified site: Secondary | ICD-10-CM | POA: Diagnosis not present

## 2022-11-07 DIAGNOSIS — R21 Rash and other nonspecific skin eruption: Secondary | ICD-10-CM

## 2022-11-07 DIAGNOSIS — Z79899 Other long term (current) drug therapy: Secondary | ICD-10-CM | POA: Diagnosis not present

## 2022-11-07 DIAGNOSIS — L282 Other prurigo: Secondary | ICD-10-CM | POA: Diagnosis not present

## 2022-11-07 DIAGNOSIS — K5090 Crohn's disease, unspecified, without complications: Secondary | ICD-10-CM | POA: Diagnosis not present

## 2022-11-07 DIAGNOSIS — M47899 Other spondylosis, site unspecified: Secondary | ICD-10-CM | POA: Diagnosis not present

## 2022-11-07 MED ORDER — HYDROXYZINE PAMOATE 25 MG PO CAPS: 25.0000 mg | ORAL_CAPSULE | Freq: Three times a day (TID) | ORAL | 0 refills | Status: DC | PRN

## 2022-11-07 NOTE — Progress Notes (Signed)
Acute Office Visit  Subjective:     Patient ID: Frederick Rush, male    DOB: 1950/07/13, 72 y.o.   MRN: [id number]  Chief Complaint  Patient presents with   Rash    HPI Patient is in today for rash all over. Saw Dr. Jorene Guest and gave him triamcinolone cream to use.  That hasn't really helped.  Then saw Dr. Lendon Colonel, Rheum, this AM and  given him a steroid shot today.  No change in lotion, detergent and soaps. No major dietary changes. Has been eating more healthy. Rash started on lower legs and was itchy.  He uses Dove sensitive moisturizer.  Says that the manufacture of his mesalamine has changed recently but it started after the rash had already started on his lower legs.  He had also been on terbinafine for about 45 days when the rash started.  He stopped it after talking with the dermatologist.  He has been off of it for about a month now   ROS      Objective:    BP (!) 129/58   Pulse (!) 56   Ht 5\' 7"  (1.702 m)   Wt 184 lb (83.5 kg)   SpO2 99%   BMI 28.82 kg/m    Physical Exam Vitals and nursing note reviewed.  Constitutional:      Appearance: Normal appearance.  HENT:     Head: Normocephalic and atraumatic.  Eyes:     Conjunctiva/sclera: Conjunctivae normal.  Cardiovascular:     Rate and Rhythm: Normal rate.  Pulmonary:     Effort: Pulmonary effort is normal.  Skin:    General: Skin is warm and dry.     Comments: Maculopapular pink rash over the upper back, torso, upper outer arms and lower legs.  Neurological:     Mental Status: He is alert.  Psychiatric:        Mood and Affect: Mood normal.        No results found for any visits on 11/07/22.      Assessment & Plan:   Problem List Items Addressed This Visit   None Visit Diagnoses     Rash and nonspecific skin eruption    -  Primary   Relevant Orders   CBC with Differential/Platelet   ANA   CMP14+EGFR   Sedimentation rate      Rash-I am concerned that it is a potential drug  reaction he stopped the terbinafine good 4 weeks ago and the rash continues to get worse so I am less likely to think that that is the cause.  I am a little suspicious of the mesalamine which can cause a rash and/or Stevens-Johnson as well.  So I did ask him to hold the medication we will check for elevated to see if it will eosinophil levels to rule out dress syndrome.  Also sent over prescription for hydroxyzine to use for itching did warn about potential sedation of the medication and he already received a steroid injection with his rheumatologist this morning.   Meds ordered this encounter  Medications   hydrOXYzine (VISTARIL) 25 MG capsule    Sig: Take 1 capsule (25 mg total) by mouth every 8 (eight) hours as needed.    Dispense:  30 capsule    Refill:  0    No follow-ups on file.  Nani Gasser, MD

## 2022-11-07 NOTE — Patient Instructions (Signed)
Please hold your mesalamine.

## 2022-11-08 LAB — CBC WITH DIFFERENTIAL/PLATELET
Basophils Absolute: 0.1 10*3/uL (ref 0.0–0.2)
Basos: 1 %
EOS (ABSOLUTE): 0.3 10*3/uL (ref 0.0–0.4)
Eos: 3 %
Hematocrit: 40.9 % (ref 37.5–51.0)
Hemoglobin: 12.9 g/dL — ABNORMAL LOW (ref 13.0–17.7)
Immature Grans (Abs): 0 10*3/uL (ref 0.0–0.1)
Immature Granulocytes: 0 %
Lymphocytes Absolute: 1.6 10*3/uL (ref 0.7–3.1)
Lymphs: 20 %
MCH: 28.3 pg (ref 26.6–33.0)
MCHC: 31.5 g/dL (ref 31.5–35.7)
MCV: 90 fL (ref 79–97)
Monocytes Absolute: 0.8 10*3/uL (ref 0.1–0.9)
Monocytes: 10 %
Neutrophils Absolute: 5.1 10*3/uL (ref 1.4–7.0)
Neutrophils: 66 %
Platelets: 271 10*3/uL (ref 150–450)
RBC: 4.56 x10E6/uL (ref 4.14–5.80)
RDW: 13.1 % (ref 11.6–15.4)
WBC: 7.8 10*3/uL (ref 3.4–10.8)

## 2022-11-08 LAB — CMP14+EGFR
ALT: 18 IU/L (ref 0–44)
AST: 20 IU/L (ref 0–40)
Albumin: 4.6 g/dL (ref 3.8–4.8)
Alkaline Phosphatase: 30 IU/L — ABNORMAL LOW (ref 44–121)
BUN/Creatinine Ratio: 13 (ref 10–24)
BUN: 13 mg/dL (ref 8–27)
Bilirubin Total: 0.4 mg/dL (ref 0.0–1.2)
CO2: 22 mmol/L (ref 20–29)
Calcium: 9.4 mg/dL (ref 8.6–10.2)
Chloride: 103 mmol/L (ref 96–106)
Creatinine, Ser: 1.04 mg/dL (ref 0.76–1.27)
Globulin, Total: 2.2 g/dL (ref 1.5–4.5)
Glucose: 89 mg/dL (ref 70–99)
Potassium: 4.6 mmol/L (ref 3.5–5.2)
Sodium: 142 mmol/L (ref 134–144)
Total Protein: 6.8 g/dL (ref 6.0–8.5)
eGFR: 76 mL/min/{1.73_m2} (ref >59)

## 2022-11-08 LAB — ANA: Anti Nuclear Antibody (ANA): NEGATIVE

## 2022-11-08 LAB — SEDIMENTATION RATE: Sed Rate: 2 mm/h (ref 0–30)

## 2022-11-08 NOTE — Progress Notes (Signed)
Hi Keith, hemoglobin is a little borderline at 12.9.  Typically 13 is normal for male.  Have you noticed any blood in the urine or stool?  Your metabolic panel overall looks good including liver and kidney function.  ANA test for lupus was negative.  Inflammatory marker was also negative which is great.  Is call the lab and see if we can add iron and B12.

## 2022-11-10 NOTE — Progress Notes (Signed)
HI [name] your B12 is low. I would like to start you on B12 shots weekly for 1 month and then every 2 weeks for 2 months and then we would recheck you levels.  Iron looks good.

## 2022-11-10 NOTE — Progress Notes (Signed)
Okay, wait until Saturday to restart the mesalamine.  But if the rash starts flaring again then immediately stop it and let your GI doctor know.

## 2022-11-10 NOTE — Progress Notes (Signed)
In regards to the foot lets take a look at that and see we can order some blood flow test here and get a better estimation of if there is a significant problem or not and then get him in with a vascular surgeon if need be.  Is his rash better?  I do not want to restart the mesalamine unless the rash is improving.  If it is not better then I do want him to reach out to his GI doctor and let them know what were doing and see if they want to see him in person to discuss alternative drugs or if they would recommend that he go back on it.  If that rash has improved then he can restart the medication but if the rash comes right back than that definitely tells [place] what is causing it.

## 2022-11-16 ENCOUNTER — Ambulatory Visit: Payer: Medicare Other | Admitting: Family Medicine

## 2022-11-16 ENCOUNTER — Ambulatory Visit (INDEPENDENT_AMBULATORY_CARE_PROVIDER_SITE_OTHER): Payer: Medicare Other | Admitting: Family Medicine

## 2022-11-16 VITALS — BP 132/79 | HR 50 | Ht 67.0 in | Wt 176.0 lb

## 2022-11-16 DIAGNOSIS — E538 Deficiency of other specified B group vitamins: Secondary | ICD-10-CM | POA: Diagnosis not present

## 2022-11-16 DIAGNOSIS — Z23 Encounter for immunization: Secondary | ICD-10-CM

## 2022-11-16 DIAGNOSIS — R0602 Shortness of breath: Secondary | ICD-10-CM

## 2022-11-16 MED ORDER — CYANOCOBALAMIN 1000 MCG/ML IJ SOLN
1000.0000 ug | Freq: Once | INTRAMUSCULAR | Status: AC
Start: 2022-11-16 — End: 2022-11-16
  Administered 2022-11-16: 1000 ug via INTRAMUSCULAR

## 2022-11-16 NOTE — Patient Instructions (Signed)
Patient advised to schedule next B-12 injection in 1 week.

## 2022-11-16 NOTE — Progress Notes (Signed)
Established Patient Office Visit  Subjective   Patient ID: Frederick Rush, male    DOB: [birth year]  Age: 72 y.o. MRN: [id number]  Chief Complaint  Patient presents with   Spirometry   Injections    B-12    HPI  He is here today for spirometry.  He has never done this before he has used an albuterol inhaler before.  Test test gray had suggested some mild COPD.  He also wanted to let me know that the rash did go away.  We had temporarily stopped his mesalamine.  He did quite and restarted on Saturday and it has been 5 days and so far he has done well and has not noticed any recurrence.  So still unclear etiology for the trigger.  Like to go ahead and get his COVID-vaccine today.    ROS    Objective:     BP 132/79   Pulse (!) 50   Ht 5\' 7"  (1.702 m)   Wt 176 lb (79.8 kg)   SpO2 99%   BMI 27.57 kg/m    Physical Exam   No results found for any visits on 11/16/22.    The ASCVD Risk score (Arnett DK, et al., 2019) failed to calculate for the following reasons:   The valid total cholesterol range is 130 to 320 mg/dL    Assessment & Plan:   Problem List Items Addressed This Visit       Other   SOB (shortness of breath) - Primary    Recent abnormal chest x-ray suggesting some early COPD and bronchitic change.  Spirometry today shows an FVC of 222.  But he did report that he was trying to sort of breathing through his nose as he was breathing out because he felt like he was running out of air.  So we discussed that this is really a measure of just what 1 deep breath into his lungs measures.  His FEV1 was 89% ratio was not accurate because of the elevated FVC.  He does occasionally use albuterol sometimes he feels a little bit tight or little bit of congestion in his upper airway he feels like a lot of it is allergy triggered.  We did discuss maybe repeating this in a year and continuing to try to improve technique for best results.  He does still have an albuterol  inhaler which she can use as needed I would still like for him to be evaluated further for COPD as I do suspect that he has some mild obstruction.      Relevant Orders   PR BRNCDILAT RSPSE SPMTRY PRE&POST-BRNCDILAT ADMN   Other Visit Diagnoses     Vitamin B12 deficiency       Relevant Medications   cyanocobalamin (VITAMIN B12) injection 1,000 mcg (Completed)   Encounter for immunization       Relevant Orders   Pfizer Comirnaty Covid-19 Vaccine 67yrs & older (Completed)       Return in about 1 week (around 11/23/2022).    Nani Gasser, MD

## 2022-11-16 NOTE — Assessment & Plan Note (Signed)
Recent abnormal chest x-ray suggesting some early COPD and bronchitic change.  Spirometry today shows an FVC of 222.  But he did report that he was trying to sort of breathing through his nose as he was breathing out because he felt like he was running out of air.  So we discussed that this is really a measure of just what 1 deep breath into his lungs measures.  His FEV1 was 89% ratio was not accurate because of the elevated FVC.  He does occasionally use albuterol sometimes he feels a little bit tight or little bit of congestion in his upper airway he feels like a lot of it is allergy triggered.  We did discuss maybe repeating this in a year and continuing to try to improve technique for best results.  He does still have an albuterol inhaler which she can use as needed I would still like for him to be evaluated further for COPD as I do suspect that he has some mild obstruction.

## 2022-11-16 NOTE — Progress Notes (Signed)
Established Patient Office Visit  Subjective   Patient ID: Frederick Rush, male    DOB: [birth year]  Age: 72 y.o. MRN: [id number]  Chief Complaint  Patient presents with   Spirometry   Injections    B-12    HPI  Patient is here for B-12 injection.Denies muscle cramps, or irregular heart rate.  ROS    Objective:     BP 132/79   Pulse (!) 50   Ht 5\' 7"  (1.702 m)   Wt 176 lb (79.8 kg)   SpO2 99%   BMI 27.57 kg/m    Physical Exam   No results found for any visits on 11/16/22.    The ASCVD Risk score (Arnett DK, et al., 2019) failed to calculate for the following reasons:   The valid total cholesterol range is 130 to 320 mg/dL    Assessment & Plan:  Patient tolerated injection well without complications.Patient advised to schedule next B-12 injection in 1 week. Problem List Items Addressed This Visit   None Visit Diagnoses     SOB (shortness of breath)    -  Primary   Relevant Orders   PR BRNCDILAT RSPSE SPMTRY PRE&POST-BRNCDILAT ADMN   Vitamin B12 deficiency       Relevant Medications   cyanocobalamin (VITAMIN B12) injection 1,000 mcg (Completed) (Start on 11/16/2022 10:15 AM)       Return in about 1 week (around 11/23/2022).    Zaiyah Sottile  Lindsay-Shavers, CMA

## 2022-11-23 ENCOUNTER — Ambulatory Visit (INDEPENDENT_AMBULATORY_CARE_PROVIDER_SITE_OTHER): Payer: Medicare Other

## 2022-11-23 VITALS — BP 129/65 | HR 56 | Ht 67.0 in

## 2022-11-23 DIAGNOSIS — E538 Deficiency of other specified B group vitamins: Secondary | ICD-10-CM

## 2022-11-23 MED ORDER — CYANOCOBALAMIN 1000 MCG/ML IJ SOLN
1000.0000 ug | Freq: Once | INTRAMUSCULAR | Status: AC
Start: 2022-11-23 — End: 2022-11-23
  Administered 2022-11-23: 1000 ug via INTRAMUSCULAR

## 2022-11-23 NOTE — Progress Notes (Signed)
Established Patient Office Visit  Subjective   Patient ID: Frederick Rush, male    DOB: 08-Nov-1950  Age: 72 y.o. MRN: [id number]  Chief Complaint  Patient presents with   B12 deficiency    B12 injection nurse visit.     HPI  B12 deficiency- B12 injection nurse visit. Today is second of weekly injections.   ROS    Objective:     BP 129/65   Pulse (!) 56   Ht 5\' 7"  (1.702 m)   SpO2 98%   BMI 27.57 kg/m    Physical Exam   No results found for any visits on 11/23/22.    The ASCVD Risk score (Arnett DK, et al., 2019) failed to calculate for the following reasons:   The valid total cholesterol range is 130 to 320 mg/dL    Assessment & Plan:  B12 injection administered Right deltoid. Patient tolerated injection well without complications.  Return in one week for next B12 injection. Problem List Items Addressed This Visit   None Visit Diagnoses     Vitamin B12 deficiency    -  Primary   Relevant Medications   cyanocobalamin (VITAMIN B12) injection 1,000 mcg (Completed) (Start on 11/23/2022  3:45 PM)       Return in about 1 week (around 11/30/2022) for next B12 injection as nurse visit. Elizabeth Palau, LPN

## 2022-11-23 NOTE — Patient Instructions (Signed)
Return in one week for next nurse visit B12 injection.

## 2022-11-28 ENCOUNTER — Telehealth: Payer: Self-pay

## 2022-11-28 NOTE — Telephone Encounter (Signed)
Pt called about his orthotics and he called last week please give him a call.

## 2022-11-29 ENCOUNTER — Telehealth: Payer: Self-pay | Admitting: Podiatry

## 2022-11-29 NOTE — Telephone Encounter (Signed)
Pt called checking to see if his orthotics are in.. I scheduled [place] to [place] on 10[place] but he asked if they are not in to please call him before he makes the trip to [place].

## 2022-11-30 ENCOUNTER — Ambulatory Visit (INDEPENDENT_AMBULATORY_CARE_PROVIDER_SITE_OTHER): Payer: Medicare Other | Admitting: Family Medicine

## 2022-11-30 VITALS — BP 126/70 | HR 56 | Ht 67.0 in | Wt 176.0 lb

## 2022-11-30 DIAGNOSIS — E538 Deficiency of other specified B group vitamins: Secondary | ICD-10-CM | POA: Diagnosis not present

## 2022-11-30 MED ORDER — CYANOCOBALAMIN 1000 MCG/ML IJ SOLN
1000.0000 ug | Freq: Once | INTRAMUSCULAR | Status: AC
Start: 2022-11-30 — End: 2022-11-30
  Administered 2022-11-30: 1000 ug via INTRAMUSCULAR

## 2022-11-30 NOTE — Progress Notes (Signed)
Pt came in today for B12 injection.  Pt reports no negative side effects from medication.  Denies any dizziness, chest pain or palpitations, and no GI problems. Injection given in LD pt tolerated well. He will RTC in 1 week for his 3rd injection.

## 2022-12-07 ENCOUNTER — Ambulatory Visit (INDEPENDENT_AMBULATORY_CARE_PROVIDER_SITE_OTHER): Payer: Medicare Other | Admitting: Family Medicine

## 2022-12-07 DIAGNOSIS — E538 Deficiency of other specified B group vitamins: Secondary | ICD-10-CM

## 2022-12-07 MED ORDER — CYANOCOBALAMIN 1000 MCG/ML IJ SOLN
1000.0000 ug | Freq: Once | INTRAMUSCULAR | Status: AC
Start: 2022-12-07 — End: 2022-12-07
  Administered 2022-12-07: 1000 ug via INTRAMUSCULAR

## 2022-12-07 NOTE — Progress Notes (Signed)
Pt came in today for B12 injection.   Pt reports no negative side effects from medication.   Denies any dizziness, chest pain or palpitations, and no GI problems.  Injection given in RD pt tolerated well. He will RTC in 2 weeks for the next 2 months.

## 2022-12-07 NOTE — Progress Notes (Signed)
Agree with below. Addendum:    I believe that she has limits with moving and including, toileting, bathing, feeding, dressing and grooming. I believe the power wheelchair is needed for pt to be able to perform ADL's in her home

## 2022-12-08 ENCOUNTER — Other Ambulatory Visit: Payer: BC Managed Care – PPO

## 2022-12-08 DIAGNOSIS — R21 Rash and other nonspecific skin eruption: Secondary | ICD-10-CM | POA: Diagnosis not present

## 2022-12-08 DIAGNOSIS — K5010 Crohn's disease of large intestine without complications: Secondary | ICD-10-CM | POA: Diagnosis not present

## 2022-12-14 ENCOUNTER — Ambulatory Visit: Payer: Medicare Other | Admitting: Family Medicine

## 2022-12-14 DIAGNOSIS — Z0000 Encounter for general adult medical examination without abnormal findings: Secondary | ICD-10-CM

## 2022-12-14 NOTE — Progress Notes (Signed)
MEDICARE ANNUAL WELLNESS VISIT  12/14/2022  Telephone Visit Disclaimer This Medicare AWV was conducted by telephone due to national recommendations for restrictions regarding the COVID-19 Pandemic (e.g. social distancing).  I verified, using two identifiers, that I am speaking with Frederick Rush or their authorized healthcare agent. I discussed the limitations, risks, security, and privacy concerns of performing an evaluation and management service by telephone and the potential availability of an in-person appointment in the future. The patient expressed understanding and agreed to proceed.  Location of Patient: Home Location of Provider (nurse):  In the office.  Subjective:    Tell Frederick Rush is a 72 y.o. male patient of Metheney, Barbarann Ehlers, MD who had a Medicare Annual Wellness Visit today via telephone. Frederick Rush is Retired and lives with their spouse. he has 2 children. he reports that he is socially active and does interact with friends/family regularly. he is moderately physically active and enjoys yard work.  Patient Care Team: Agapito Games, MD as PCP - General (Family Medicine) Runell Gess, MD as PCP - Cardiology (Cardiology) Crist Fat, MD as Attending Physician (Urology) Zachery Dakins, MD as Referring Physician (Internal Medicine)     12/14/2022    9:08 AM 12/31/2018    4:00 PM 12/24/2018    1:25 PM 07/16/2011    2:41 PM  Advanced Directives  Does Patient Have a Medical Advance Directive? Yes Yes Yes Patient does not have advance directive;Patient would like information  Type of Advance Directive Living will;Healthcare Power of [employer] Power of [place];Living will Healthcare Power of [place];Living will   Does patient want to make changes to medical advance directive? No - Patient declined No - Patient declined    Copy of Healthcare Power of Attorney in Chart? No - copy requested No - copy requested    Would patient like  information on creating a medical advance directive?    Advance directive packet given  Pre-existing out of facility DNR order (yellow form or pink MOST form)    No    Hospital Utilization Over the Past 12 Months: # of hospitalizations or ER visits: 0 # of surgeries: 0  Review of Systems    Patient reports that his overall health is unchanged compared to last year.  History obtained from chart review and the patient  Patient Reported Readings (BP, Pulse, CBG, Weight, etc) none Per patient no change in vitals since last visit, unable to obtain new vitals due to telehealth visit  Pain Assessment Pain : No/denies pain     Current Medications & Allergies (verified) Allergies as of 12/14/2022       Reactions   Latex    Pt stated that latex causes itching.   Gabapentin Rash        Medication List        Accurate as of December 14, 2022  9:17 AM. If you have any questions, ask your nurse or doctor.          albuterol 108 (90 Base) MCG/ACT inhaler Commonly known as: VENTOLIN HFA INHALE 2 PUFFS INTO THE LUNGS EVERY 6 HOURS AS NEEDED FOR WHEEZING OR SHORTNESS OF BREATH   amLODipine 10 MG tablet Commonly known as: NORVASC Take 1 tablet (10 mg total) by mouth daily.   baclofen 10 MG tablet Commonly known as: LIORESAL Take 1 tablet (10 mg total) by mouth 3 (three) times daily.   dicyclomine 20 MG tablet Commonly known as: BENTYL Take 20 mg by mouth 3 (  three) times daily.   fluticasone 50 MCG/ACT nasal spray Commonly known as: FLONASE Place 1 spray into both nostrils daily.   hydrOXYzine 25 MG capsule Commonly known as: VISTARIL Take 1 capsule (25 mg total) by mouth every 8 (eight) hours as needed.   lisinopril 20 MG tablet Commonly known as: ZESTRIL TAKE 1 TABLET(20 MG) BY MOUTH DAILY   meloxicam 15 MG tablet Commonly known as: MOBIC TAKE 1 TABLET(15 MG) BY MOUTH DAILY AS NEEDED FOR PAIN   mesalamine 0.375 g 24 hr capsule Commonly known as: Apriso Take 4  capsules (1.5 g total) by mouth every morning. 1500mg =4 capsules   rosuvastatin 40 MG tablet Commonly known as: CRESTOR TAKE 1 TABLET(40 MG) BY MOUTH DAILY        History (reviewed): Past Medical History:  Diagnosis Date   Arthritis    CAD (coronary artery disease), non obstructive by cath 07/19/11, treat medically 07/19/2011   Crohn disease (HCC)    Hypercholesterolemia    Hyperlipidemia    Myocardial infarction [facility])    Skin cancer 2006   Left shoulder; followed every six months.     Past Surgical History:  Procedure Laterality Date   APPENDECTOMY     CARDIAC CATHETERIZATION  07/15/2011   Medical management   ELBOW SURGERY Right    FRACTURE SURGERY Right 2015   elbow   HERNIA REPAIR     KNEE SURGERY     arthroscopy   LEFT HEART CATHETERIZATION WITH CORONARY ANGIOGRAM  07/19/2011   Procedure: LEFT HEART CATHETERIZATION WITH CORONARY ANGIOGRAM;  Surgeon: Marykay Lex, MD;  Location: [facility] CATH LAB;  Service: Cardiovascular;;   TONSILLECTOMY     TOTAL KNEE ARTHROPLASTY Left 12/31/2018   Procedure: TOTAL KNEE ARTHROPLASTY;  Surgeon: Ollen Gross, MD;  Location: [facility] ORS;  Service: Orthopedics;  Laterality: Left;    VASECTOMY     Family History  Problem Relation Age of Onset   Cancer Mother        Breast cancer; leukemia   Heart disease Mother 35       CABG   Hypertension Brother    Social History   Socioeconomic History   Marital status: Married    Spouse name: Zella Ball   Number of children: 2   Years of education: some college   Highest education level: Some college, no degree  Occupational History   Occupation: BULK DRIVER    Employer: [employer]    Comment: Retired   Occupation: Freight Liner-Part time  Tobacco Use   Smoking status: Former    Current packs/day: 0.00    Types: Cigarettes    Quit date: 07/16/1998    Years since quitting: 24.4   Smokeless tobacco: Never  Vaping Use   Vaping status: Never Used  Substance and Sexual  Activity   Alcohol use: Yes    Alcohol/week: 5.0 standard drinks of alcohol    Types: 5 Cans of beer per week   Drug use: No   Sexual activity: Yes    Partners: Female    Birth control/protection: None, Post-menopausal  Other Topics Concern   Not on file  Social History Narrative   Marital status: married x 39 years      Children:  2 children; 3 grandchildren; no gg      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       Lives: Lives with his wife and their pets.  Tobacco:  None; quit in 2000; smoked x 15 years + 20 years = 35 years.      Alcohol:    5 beers a week       Exercise: mows yard.      Seatbelt: 100%; no texting   He enjoys yardwork.   Social Determinants of Health   Financial Resource Strain: Low Risk  (12/13/2022)   Overall Financial Resource Strain (CARDIA)    Difficulty of Paying Living Expenses: Not hard at all  Food Insecurity: No Food Insecurity (12/13/2022)   Hunger Vital Sign    Worried About Running Out of Food in the Last Year: Never true    Ran Out of Food in the Last Year: Never true  Transportation Needs: No Transportation Needs (12/13/2022)   PRAPARE - Administrator, Civil Service (Medical): No    Lack of Transportation (Non-Medical): No  Physical Activity: Sufficiently Active (12/13/2022)   Exercise Vital Sign    Days of Exercise per Week: 7 days    Minutes of Exercise per Session: 30 min  Stress: No Stress Concern Present (12/13/2022)   [employer] of Occupational Health - Occupational Stress Questionnaire    Feeling of Stress : Only a little  Recent Concern: Stress - Stress Concern Present (10/07/2022)   [employer] of Occupational Health - Occupational Stress Questionnaire    Feeling of Stress : To some extent  Social Connections: Unknown (12/14/2022)   Social Connection and Isolation Panel [NHANES]    Frequency of Communication with Friends and Family: More than three times a  week    Frequency of Social Gatherings with Friends and Family: Patient declined    Attends Religious Services: Patient declined    Database administrator or Organizations: Patient declined    Attends Banker Meetings: Patient declined    Marital Status: Married    Activities of Daily Living    12/13/2022    8:30 AM  In your present state of health, do you have any difficulty performing the following activities:  Hearing? 0  Vision? 0  Difficulty concentrating or making decisions? 0  Walking or climbing stairs? 0  Dressing or bathing? 0  Doing errands, shopping? 0  Preparing Food and eating ? N  Using the Toilet? N  In the past six months, have you accidently leaked urine? N  Do you have problems with loss of bowel control? N  Managing your Medications? N  Managing your Finances? N  Housekeeping or managing your Housekeeping? N    Patient Education/ Literacy How often do you need to have someone help you when you read instructions, pamphlets, or other written materials from your doctor or pharmacy?: 1 - Never What is the last grade level you completed in school?: 12th grade and some college  Exercise    Diet Patient reports consuming 3 meals a day and 1 snack(s) a day Patient reports that his primary diet is: Regular Patient reports that she does have regular access to food.   Depression Screen    12/14/2022    9:07 AM 10/11/2022   12:17 PM 03/17/2022    2:28 PM 09/13/2021    1:54 PM 02/16/2021    3:04 PM 02/17/2020    1:27 PM 12/12/2018    1:53 PM  PHQ 2/9 Scores  PHQ - 2 Score 1 3 0 0 0 0 0  PHQ- 9 Score  3          Fall Risk  12/14/2022    9:07 AM 12/13/2022    8:30 AM 10/11/2022   12:17 PM 03/17/2022    2:28 PM 09/13/2021    1:54 PM  Fall Risk   Falls in the past year? 0 0 0 0 0  Number falls in past yr: 0 0 0 0 0  Injury with Fall? 0 0 0 0 0  Risk for fall due to : No Fall Risks  No Fall Risks No Fall Risks No Fall Risks  Follow up  Falls evaluation completed  Falls evaluation completed Falls evaluation completed Falls prevention discussed     Objective:  Frederick Rush seemed alert and oriented and he participated appropriately during our telephone visit.  Blood Pressure Weight BMI  BP Readings from Last 3 Encounters:  11/30/22 126/70  11/23/22 129/65  11/16/22 132/79   Wt Readings from Last 3 Encounters:  11/30/22 176 lb (79.8 kg)  11/16/22 176 lb (79.8 kg)  11/07/22 184 lb (83.5 kg)   BMI Readings from Last 1 Encounters:  11/30/22 27.57 kg/m    *Unable to obtain current vital signs, weight, and BMI due to telephone visit type  Hearing/Vision  Konnar did not seem to have difficulty with hearing/understanding during the telephone conversation Reports that he has had a formal eye exam by an eye care professional within the past year Reports that he has had a formal hearing evaluation within the past year *Unable to fully assess hearing and vision during telephone visit type  Cognitive Function:    12/14/2022    9:08 AM  6CIT Screen  What Year? 0 points  What month? 0 points  What time? 0 points  Count back from 20 0 points  Months in reverse 0 points  Repeat phrase 0 points  Total Score 0 points   (Normal:0-7, Significant for Dysfunction: >8)  Normal Cognitive Function Screening: Yes   Immunization & Health Maintenance Record Immunization History  Administered Date(s) Administered   Fluad Quad(high Dose 65+) 09/25/2019, 11/23/2020, 11/06/2021   Fluad Trivalent(High Dose 65+) 11/07/2022   Influenza, High Dose Seasonal PF 11/20/2017, 11/24/2018   Influenza-Unspecified 12/12/2013, 12/22/2015, 11/21/2016, 11/03/2021   Moderna Covid-19 Fall Seasonal Vaccine 5yrs & older 11/03/2021, 11/06/2021   PFIZER Comirnaty(Gray Top)Covid-19 Tri-Sucrose Vaccine 06/01/2020   PFIZER(Purple Top)SARS-COV-2 Vaccination 04/13/2019, 05/07/2019, 12/23/2019   PNEUMOCOCCAL CONJUGATE-20 03/17/2022   Pfizer Covid-19  Vaccine Bivalent Booster 65yrs & up 11/09/2020   Pfizer(Comirnaty)Fall Seasonal Vaccine 12 years and older 11/03/2021, 11/16/2022   Pneumococcal Conjugate-13 10/04/2014   Pneumococcal Polysaccharide-23 05/24/2016   RSV,unspecified 11/03/2021   Rsv, Bivalent, Protein Subunit Rsvpref,pf Verdis Frederickson) 12/17/2021   Tdap 02/22/2012, 09/28/2018   Zoster Recombinant(Shingrix) 02/19/2019, 08/16/2019    Health Maintenance  Topic Date Due   COVID-19 Vaccine (8 - 2023-24 season) 01/11/2023   Medicare Annual Wellness (AWV)  12/14/2023   Colonoscopy  05/06/2025   DTaP/Tdap/Td (3 - Td or Tdap) 09/27/2028   Pneumonia Vaccine 44+ Years old  Completed   INFLUENZA VACCINE  Completed   Hepatitis C Screening  Completed   Zoster Vaccines- Shingrix  Completed   HPV VACCINES  Aged Out       Assessment  This is a routine wellness examination for Frederick Rush.  Health Maintenance: Due or Overdue There are no preventive care reminders to display for this patient.   Frederick Rush does not need a referral for Community Assistance: Care Management:   no Social Work:    no Prescription Assistance:  no Nutrition/Diabetes Education:  no  Plan:  Personalized Goals  Goals Addressed               This Visit's Progress     Patient Stated (pt-stated)        Patient stated that he would like to have his legs stop hurting so he can go back to the gym.       Personalized Health Maintenance & Screening Recommendations  There are no preventive care reminders to display for this patient.  Lung Cancer Screening Recommended: no (Low Dose CT Chest recommended if Age 18-80 years, 20 pack-year currently smoking OR have quit w/in past 15 years) Hepatitis C Screening recommended: no HIV Screening recommended: no  Advanced Directives: Written information was not prepared per patient's request.  Referrals & Orders No orders of the defined types were placed in this encounter.   Follow-up  Plan Follow-up with Agapito Games, MD as planned Medicare wellness visit in one year.  Patient will access AVS on my chart.   I have personally reviewed and noted the following in the patient's chart:   Medical and social history Use of alcohol, tobacco or illicit drugs  Current medications and supplements Functional ability and status Nutritional status Physical activity Advanced directives List of other physicians Hospitalizations, surgeries, and ER visits in previous 12 months Vitals Screenings to include cognitive, depression, and falls Referrals and appointments  In addition, I have reviewed and discussed with Frederick Rush certain preventive protocols, quality metrics, and best practice recommendations. A written personalized care plan for preventive services as well as general preventive health recommendations is available and can be mailed to the patient at his request.      Modesto Charon, RN BSN  12/14/2022

## 2022-12-14 NOTE — Patient Instructions (Addendum)
MEDICARE ANNUAL WELLNESS VISIT Health Maintenance Summary and Written Plan of Care  Mr. Frederick Rush ,  Thank you for allowing me to perform your Medicare Annual Wellness Visit and for your ongoing commitment to your health.   Health Maintenance & Immunization History Health Maintenance  Topic Date Due   COVID-19 Vaccine (8 - 2023-24 season) 01/11/2023   Medicare Annual Wellness (AWV)  12/14/2023   Colonoscopy  05/06/2025   DTaP/Tdap/Td (3 - Td or Tdap) 09/27/2028   Pneumonia Vaccine 8+ Years old  Completed   INFLUENZA VACCINE  Completed   Hepatitis C Screening  Completed   Zoster Vaccines- Shingrix  Completed   HPV VACCINES  Aged Out   Immunization History  Administered Date(s) Administered   Fluad Quad(high Dose 65+) 09/25/2019, 11/23/2020, 11/06/2021   Fluad Trivalent(High Dose 65+) 11/07/2022   Influenza, High Dose Seasonal PF 11/20/2017, 11/24/2018   Influenza-Unspecified 12/12/2013, 12/22/2015, 11/21/2016, 11/03/2021   Moderna Covid-19 Fall Seasonal Vaccine 68yrs & older 11/03/2021, 11/06/2021   PFIZER Comirnaty(Gray Top)Covid-19 Tri-Sucrose Vaccine 06/01/2020   PFIZER(Purple Top)SARS-COV-2 Vaccination 04/13/2019, 05/07/2019, 12/23/2019   PNEUMOCOCCAL CONJUGATE-20 03/17/2022   Pfizer Covid-19 Vaccine Bivalent Booster 86yrs & up 11/09/2020   Pfizer(Comirnaty)Fall Seasonal Vaccine 12 years and older 11/03/2021, 11/16/2022   Pneumococcal Conjugate-13 10/04/2014   Pneumococcal Polysaccharide-23 05/24/2016   RSV,unspecified 11/03/2021   Rsv, Bivalent, Protein Subunit Rsvpref,pf Verdis Frederickson) 12/17/2021   Tdap 02/22/2012, 09/28/2018   Zoster Recombinant(Shingrix) 02/19/2019, 08/16/2019    These are the patient goals that we discussed:  Goals Addressed               This Visit's Progress     Patient Stated (pt-stated)        Patient stated that he would like to have his legs stop hurting so he can go back to the gym.         This is a list of Health Maintenance Items  that are overdue or due now: There are no preventive care reminders to display for this patient.    Orders/Referrals Placed Today: No orders of the defined types were placed in this encounter.  (Contact our referral department at [phone] if you have not spoken with someone about your referral appointment within the next 5 days)    Follow-up Plan Follow-up with Agapito Games, MD as planned Medicare wellness visit in one year.  Patient will access AVS on my chart.      Fall Prevention in the Home, Adult Falls can cause injuries and can happen to people of all ages. There are many things you can do to make your home safer and to help prevent falls. What actions can I take to prevent falls? General information Use good lighting in all rooms. Make sure to: Replace any light bulbs that burn out. Turn on the lights in dark areas and use night-lights. Keep items that you use often in easy-to-reach places. Lower the shelves around your home if needed. Move furniture so that there are clear paths around it. Do not use throw rugs or other things on the floor that can make you trip. If any of your floors are uneven, fix them. Add color or contrast paint or tape to clearly mark and help you see: Grab bars or handrails. First and last steps of staircases. Where the edge of each step is. If you use a ladder or stepladder: Make sure that it is fully opened. Do not climb a closed ladder. Make sure the sides of the ladder are locked  in place. Have someone hold the ladder while you use it. Know where your pets are as you move through your home. What can I do in the bathroom?     Keep the floor dry. Clean up any water on the floor right away. Remove soap buildup in the bathtub or shower. Buildup makes bathtubs and showers slippery. Use non-skid mats or decals on the floor of the bathtub or shower. Attach bath mats securely with double-sided, non-slip rug tape. If you need to  sit down in the shower, use a non-slip stool. Install grab bars by the toilet and in the bathtub and shower. Do not use towel bars as grab bars. What can I do in the bedroom? Make sure that you have a light by your bed that is easy to reach. Do not use any sheets or blankets on your bed that hang to the floor. Have a firm chair or bench with side arms that you can use for support when you get dressed. What can I do in the kitchen? Clean up any spills right away. If you need to reach something above you, use a step stool with a grab bar. Keep electrical cords out of the way. Do not use floor polish or wax that makes floors slippery. What can I do with my stairs? Do not leave anything on the stairs. Make sure that you have a light switch at the top and the bottom of the stairs. Make sure that there are handrails on both sides of the stairs. Fix handrails that are broken or loose. Install non-slip stair treads on all your stairs if they do not have carpet. Avoid having throw rugs at the top or bottom of the stairs. Choose a carpet that does not hide the edge of the steps on the stairs. Make sure that the carpet is firmly attached to the stairs. Fix carpet that is loose or worn. What can I do on the outside of my home? Use bright outdoor lighting. Fix the edges of walkways and driveways and fix any cracks. Clear paths of anything that can make you trip, such as tools or rocks. Add color or contrast paint or tape to clearly mark and help you see anything that might make you trip as you walk through a door, such as a raised step or threshold. Trim any bushes or trees on paths to your home. Check to see if handrails are loose or broken and that both sides of all steps have handrails. Install guardrails along the edges of any raised decks and porches. Have leaves, snow, or ice cleared regularly. Use sand, salt, or ice melter on paths if you live where there is ice and snow during the winter. Clean  up any spills in your garage right away. This includes grease or oil spills. What other actions can I take? Review your medicines with your doctor. Some medicines can cause dizziness or changes in blood pressure, which increase your risk of falling. Wear shoes that: Have a low heel. Do not wear high heels. Have rubber bottoms and are closed at the toe. Feel good on your feet and fit well. Use tools that help you move around if needed. These include: Canes. Walkers. Scooters. Crutches. Ask your doctor what else you can do to help prevent falls. This may include seeing a physical therapist to learn to do exercises to move better and get stronger. Where to find more information Centers for Disease Control and Prevention, STEADI: TonerPromos.no [employer] on  Aging: BaseRingTones.pl National Institute on Aging: BaseRingTones.pl Contact a doctor if: You are afraid of falling at home. You feel weak, drowsy, or dizzy at home. You fall at home. Get help right away if you: Lose consciousness or have trouble moving after a fall. Have a fall that causes a head injury. These symptoms may be an emergency. Get help right away. Call 911. Do not wait to see if the symptoms will go away. Do not drive yourself to the hospital. This information is not intended to replace advice given to you by your health care provider. Make sure you discuss any questions you have with your health care provider. Document Revised: 10/11/2021 Document Reviewed: 10/11/2021 Elsevier Patient Education  2024 Elsevier Inc.  Health Maintenance, Male Adopting a healthy lifestyle and getting preventive care are important in promoting health and wellness. Ask your health care provider about: The right schedule for you to have regular tests and exams. Things you can do on your own to prevent diseases and keep yourself healthy. What should I know about diet, weight, and exercise? Eat a healthy diet  Eat a diet that includes plenty of  vegetables, fruits, low-fat dairy products, and lean protein. Do not eat a lot of foods that are high in solid fats, added sugars, or sodium. Maintain a healthy weight Body mass index (BMI) is a measurement that can be used to identify possible weight problems. It estimates body fat based on height and weight. Your health care provider can help determine your BMI and help you achieve or maintain a healthy weight. Get regular exercise Get regular exercise. This is one of the most important things you can do for your health. Most adults should: Exercise for at least 150 minutes each week. The exercise should increase your heart rate and make you sweat (moderate-intensity exercise). Do strengthening exercises at least twice a week. This is in addition to the moderate-intensity exercise. Spend less time sitting. Even light physical activity can be beneficial. Watch cholesterol and blood lipids Have your blood tested for lipids and cholesterol at 74 years of age, then have this test every 5 years. You may need to have your cholesterol levels checked more often if: Your lipid or cholesterol levels are high. You are older than 74 years of age. You are at high risk for heart disease. What should I know about cancer screening? Many types of cancers can be detected early and may often be prevented. Depending on your health history and family history, you may need to have cancer screening at various ages. This may include screening for: Colorectal cancer. Prostate cancer. Skin cancer. Lung cancer. What should I know about heart disease, diabetes, and high blood pressure? Blood pressure and heart disease High blood pressure causes heart disease and increases the risk of stroke. This is more likely to develop in people who have high blood pressure readings or are overweight. Talk with your health care provider about your target blood pressure readings. Have your blood pressure checked: Every 3-5 years  if you are 37-34 years of age. Every year if you are 39 years old or older. If you are between the ages of 24 and 81 and are a current or former smoker, ask your health care provider if you should have a one-time screening for abdominal aortic aneurysm (AAA). Diabetes Have regular diabetes screenings. This checks your fasting blood sugar level. Have the screening done: Once every three years after age 17 if you are at a normal weight and  have a low risk for diabetes. More often and at a younger age if you are overweight or have a high risk for diabetes. What should I know about preventing infection? Hepatitis B If you have a higher risk for hepatitis B, you should be screened for this virus. Talk with your health care provider to find out if you are at risk for hepatitis B infection. Hepatitis C Blood testing is recommended for: Everyone born from [birth year] through 1965. Anyone with known risk factors for hepatitis C. Sexually transmitted infections (STIs) You should be screened each year for STIs, including gonorrhea and chlamydia, if: You are sexually active and are younger than 74 years of age. You are older than 74 years of age and your health care provider tells you that you are at risk for this type of infection. Your sexual activity has changed since you were last screened, and you are at increased risk for chlamydia or gonorrhea. Ask your health care provider if you are at risk. Ask your health care provider about whether you are at high risk for HIV. Your health care provider may recommend a prescription medicine to help prevent HIV infection. If you choose to take medicine to prevent HIV, you should first get tested for HIV. You should then be tested every 3 months for as long as you are taking the medicine. Follow these instructions at home: Alcohol use Do not drink alcohol if your health care provider tells you not to drink. If you drink alcohol: Limit how much you have to 0-2 drinks a  day. Know how much alcohol is in your drink. In the [place] one drink equals one 12 oz bottle of beer (355 mL), one 5 oz glass of wine (148 mL), or one 1 oz glass of hard liquor (44 mL). Lifestyle Do not use any products that contain nicotine or tobacco. These products include cigarettes, chewing tobacco, and vaping devices, such as e-cigarettes. If you need help quitting, ask your health care provider. Do not use street drugs. Do not share needles. Ask your health care provider for help if you need support or information about quitting drugs. General instructions Schedule regular health, dental, and eye exams. Stay current with your vaccines. Tell your health care provider if: You often feel depressed. You have ever been abused or do not feel safe at home. Summary Adopting a healthy lifestyle and getting preventive care are important in promoting health and wellness. Follow your health care provider's instructions about healthy diet, exercising, and getting tested or screened for diseases. Follow your health care provider's instructions on monitoring your cholesterol and blood pressure. This information is not intended to replace advice given to you by your health care provider. Make sure you discuss any questions you have with your health care provider. Document Revised: 06/29/2020 Document Reviewed: 06/29/2020 Elsevier Patient Education  2024 [employer].

## 2022-12-21 ENCOUNTER — Ambulatory Visit (INDEPENDENT_AMBULATORY_CARE_PROVIDER_SITE_OTHER): Payer: Medicare Other | Admitting: Family Medicine

## 2022-12-21 VITALS — BP 117/54 | HR 59 | Resp 20

## 2022-12-21 DIAGNOSIS — E538 Deficiency of other specified B group vitamins: Secondary | ICD-10-CM | POA: Diagnosis not present

## 2022-12-21 MED ORDER — CYANOCOBALAMIN 1000 MCG/ML IJ SOLN
1000.0000 ug | Freq: Once | INTRAMUSCULAR | Status: AC
Start: 2022-12-21 — End: 2022-12-21
  Administered 2022-12-21: 1000 ug via INTRAMUSCULAR

## 2022-12-21 NOTE — Progress Notes (Unsigned)
Subjective:    Patient ID: Frederick Rush, male    DOB: November 21, 1950, 72 y.o.   MRN: [id number]  HPI  Patient is here for B12 injection. Denies muscle cramps, weakness or irregular heart rate.   Location: Left Deltoid  Review of Systems     Objective:   Physical Exam        Assessment & Plan:   Patient advised to schedule next appointment for 2 weeks.

## 2022-12-22 NOTE — Progress Notes (Signed)
Agree with documentation as above.   Teonna Coonan, MD  

## 2022-12-29 ENCOUNTER — Ambulatory Visit (INDEPENDENT_AMBULATORY_CARE_PROVIDER_SITE_OTHER): Payer: BC Managed Care – PPO

## 2022-12-29 NOTE — Progress Notes (Signed)
Patient presents today to pick up custom molded foot orthotics, diagnosed with Hallux Limitus by Dr. Ralene Cork.   Orthotics were dispensed and fit was satisfactory. Reviewed instructions for break-in and wear. Written instructions given to patient.  Patient will follow up as needed.   Frederick Rush CPed, CFo, CFm

## 2022-12-30 ENCOUNTER — Encounter: Payer: Self-pay | Admitting: Family Medicine

## 2022-12-30 ENCOUNTER — Ambulatory Visit (INDEPENDENT_AMBULATORY_CARE_PROVIDER_SITE_OTHER): Payer: Medicare Other | Admitting: Family Medicine

## 2022-12-30 VITALS — BP 128/56 | HR 53 | Temp 98.0°F | Resp 20 | Ht 67.0 in | Wt 184.1 lb

## 2022-12-30 DIAGNOSIS — J0100 Acute maxillary sinusitis, unspecified: Secondary | ICD-10-CM | POA: Diagnosis not present

## 2022-12-30 DIAGNOSIS — J111 Influenza due to unidentified influenza virus with other respiratory manifestations: Secondary | ICD-10-CM | POA: Diagnosis not present

## 2022-12-30 LAB — POC COVID19 BINAXNOW: SARS Coronavirus 2 Ag: NEGATIVE

## 2022-12-30 LAB — POCT RAPID STREP A (OFFICE): Rapid Strep A Screen: NEGATIVE

## 2022-12-30 LAB — POCT INFLUENZA A/B
Influenza A, POC: NEGATIVE
Influenza B, POC: NEGATIVE

## 2022-12-30 MED ORDER — DOXYCYCLINE HYCLATE 100 MG PO TABS: 100.0000 mg | ORAL_TABLET | Freq: Two times a day (BID) | ORAL | 0 refills | Status: DC

## 2022-12-30 NOTE — Progress Notes (Signed)
Frederick Rush - 72 y.o. male MRN [id number]  Date of birth: [birth year]  Subjective Chief Complaint  Patient presents with   Cough   Nasal Congestion   Sore Throat    HPI Frederick Rush is a 72 y.o. here today with complaint of cough, congestion and sore throat.  His symptoms started about 4 days ago.  Acutely worsened yesterday morning with increased sinus and ear pain.  He has had thick yellow mucus when blowing his nose. He has tried OTC allergy medication for management of symptoms.  He has not had have fever.  He does have history of Crohn's but is not on any biologic medications for management of this.  Remote history or smoking but no dx of COPD or asthma.   ROS:  A comprehensive ROS was completed and negative except as noted per HPI  Allergies  Allergen Reactions   Latex     Pt stated that latex causes itching.   Gabapentin Rash    Past Medical History:  Diagnosis Date   Arthritis    CAD (coronary artery disease), non obstructive by cath 07/19/11, treat medically 07/19/2011   Crohn disease (HCC)    Hypercholesterolemia    Hyperlipidemia    Myocardial infarction [facility])    Skin cancer 2006   Left shoulder; followed every six months.      Past Surgical History:  Procedure Laterality Date   APPENDECTOMY     CARDIAC CATHETERIZATION  07/15/2011   Medical management   ELBOW SURGERY Right    FRACTURE SURGERY Right 2015   elbow   HERNIA REPAIR     KNEE SURGERY     arthroscopy   LEFT HEART CATHETERIZATION WITH CORONARY ANGIOGRAM  07/19/2011   Procedure: LEFT HEART CATHETERIZATION WITH CORONARY ANGIOGRAM;  Surgeon: Marykay Lex, MD;  Location: [facility] CATH LAB;  Service: Cardiovascular;;   TONSILLECTOMY     TOTAL KNEE ARTHROPLASTY Left 12/31/2018   Procedure: TOTAL KNEE ARTHROPLASTY;  Surgeon: Ollen Gross, MD;  Location: [facility] ORS;  Service: Orthopedics;  Laterality: Left;    VASECTOMY      Social History   Socioeconomic History   Marital status: Married     Spouse name: Zella Ball   Number of children: 2   Years of education: some college   Highest education level: Some college, no degree  Occupational History   Occupation: BULK DRIVER    Employer: [employer]    Comment: Retired   Occupation: Freight Liner-Part time  Tobacco Use   Smoking status: Former    Current packs/day: 0.00    Types: Cigarettes    Quit date: 07/16/1998    Years since quitting: 24.4   Smokeless tobacco: Never  Vaping Use   Vaping status: Never Used  Substance and Sexual Activity   Alcohol use: Yes    Alcohol/week: 5.0 standard drinks of alcohol    Types: 5 Cans of beer per week   Drug use: No   Sexual activity: Yes    Partners: Female    Birth control/protection: None, Post-menopausal  Other Topics Concern   Not on file  Social History Narrative   Marital status: married x 39 years      Children:  2 children; 3 grandchildren; no gg      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       Lives: Lives with his wife and their pets.      Tobacco:  None; quit in 2000; smoked x 15 years + 20 years = 35 years.      Alcohol:    5 beers a week       Exercise: mows yard.      Seatbelt: 100%; no texting   He enjoys yardwork.   Social Determinants of Health   Financial Resource Strain: Low Risk  (12/13/2022)   Overall Financial Resource Strain (CARDIA)    Difficulty of Paying Living Expenses: Not hard at all  Food Insecurity: No Food Insecurity (12/13/2022)   Hunger Vital Sign    Worried About Running Out of Food in the Last Year: Never true    Ran Out of Food in the Last Year: Never true  Transportation Needs: No Transportation Needs (12/13/2022)   PRAPARE - Administrator, Civil Service (Medical): No    Lack of Transportation (Non-Medical): No  Physical Activity: Sufficiently Active (12/13/2022)   Exercise Vital Sign    Days of Exercise per Week: 7 days    Minutes of Exercise per Session: 30 min   Stress: No Stress Concern Present (12/13/2022)   [employer] of Occupational Health - Occupational Stress Questionnaire    Feeling of Stress : Only a little  Recent Concern: Stress - Stress Concern Present (10/07/2022)   [employer] of Occupational Health - Occupational Stress Questionnaire    Feeling of Stress : To some extent  Social Connections: Unknown (12/14/2022)   Social Connection and Isolation Panel [NHANES]    Frequency of Communication with Friends and Family: More than three times a week    Frequency of Social Gatherings with Friends and Family: Patient declined    Attends Religious Services: Patient declined    Database administrator or Organizations: Patient declined    Attends Banker Meetings: Patient declined    Marital Status: Married    Family History  Problem Relation Age of Onset   Cancer Mother        Breast cancer; leukemia   Heart disease Mother 19       CABG   Hypertension Brother     Health Maintenance  Topic Date Due   COVID-19 Vaccine (8 - 2023-24 season) 01/11/2023   Medicare Annual Wellness (AWV)  12/14/2023   Colonoscopy  05/06/2025   DTaP/Tdap/Td (3 - Td or Tdap) 09/27/2028   Pneumonia Vaccine 92+ Years old  Completed   INFLUENZA VACCINE  Completed   Hepatitis C Screening  Completed   Zoster Vaccines- Shingrix  Completed   HPV VACCINES  Aged Out     ----------------------------------------------------------------------------------------------------------------------------------------------------------------------------------------------------------------- Physical Exam BP (!) 128/56 (BP Location: Left Arm, Cuff Size: Normal)   Pulse (!) 53   Temp 98 F (36.7 C)   Resp 20   Ht 5\' 7"  (1.702 m)   Wt 184 lb 1.9 oz (83.5 kg)   SpO2 99%   BMI 28.84 kg/m   Physical Exam Constitutional:      Appearance: Normal appearance.  HENT:     Right Ear: Tympanic membrane normal.     Left Ear: Tympanic membrane normal.      Nose:     Comments: TTP along b/l maxillary sinus.  Turbinates erythematous.  Eyes:     General: No scleral icterus. Cardiovascular:     Rate and Rhythm: Normal rate and regular rhythm.  Pulmonary:     Effort: Pulmonary effort is normal.     Breath sounds: Normal breath sounds.  Musculoskeletal:     Cervical back: Neck  supple.  Neurological:     Mental Status: He is alert.  Psychiatric:        Mood and Affect: Mood normal.     ------------------------------------------------------------------------------------------------------------------------------------------------------------------------------------------------------------------- Assessment and Plan  Acute maxillary sinusitis POC COVID and Flu negative.  Treating with doxycycline x10 day course.  Recommend continued supportive care.  Continue to stay well hydrated. Contact clinic if not improving or continuing to worsen.    Meds ordered this encounter  Medications   doxycycline (VIBRA-TABS) 100 MG tablet    Sig: Take 1 tablet (100 mg total) by mouth 2 (two) times daily.    Dispense:  20 tablet    Refill:  0    No follow-ups on file.    This visit occurred during the SARS-CoV-2 public health emergency.  Safety protocols were in place, including screening questions prior to the visit, additional usage of staff PPE, and extensive cleaning of exam room while observing appropriate contact time as indicated for disinfecting solutions.

## 2022-12-30 NOTE — Assessment & Plan Note (Signed)
POC COVID and Flu negative.  Treating with doxycycline x10 day course.  Recommend continued supportive care.  Continue to stay well hydrated. Contact clinic if not improving or continuing to worsen.

## 2022-12-30 NOTE — Patient Instructions (Signed)

## 2023-01-04 ENCOUNTER — Ambulatory Visit (INDEPENDENT_AMBULATORY_CARE_PROVIDER_SITE_OTHER): Payer: Medicare Other | Admitting: Physician Assistant

## 2023-01-04 VITALS — BP 136/60 | HR 57 | Resp 20

## 2023-01-04 DIAGNOSIS — E538 Deficiency of other specified B group vitamins: Secondary | ICD-10-CM | POA: Diagnosis not present

## 2023-01-04 MED ORDER — CYANOCOBALAMIN 1000 MCG/ML IJ SOLN
1000.0000 ug | Freq: Once | INTRAMUSCULAR | Status: AC
Start: 2023-01-04 — End: 2023-01-04
  Administered 2023-01-04: 1000 ug via INTRAMUSCULAR

## 2023-01-04 NOTE — Progress Notes (Signed)
Patient here for Vitamin B12 injection. Denies muscle cramps, weakness or irregulare heart rate.  Location: Right Deltoid  Patient advised to schedule next appointment for 2 weeks.

## 2023-01-04 NOTE — Progress Notes (Signed)
Agree with below plan.  

## 2023-01-09 DIAGNOSIS — L309 Dermatitis, unspecified: Secondary | ICD-10-CM | POA: Diagnosis not present

## 2023-01-09 DIAGNOSIS — L270 Generalized skin eruption due to drugs and medicaments taken internally: Secondary | ICD-10-CM | POA: Diagnosis not present

## 2023-01-16 DIAGNOSIS — K5010 Crohn's disease of large intestine without complications: Secondary | ICD-10-CM | POA: Diagnosis not present

## 2023-01-16 DIAGNOSIS — R21 Rash and other nonspecific skin eruption: Secondary | ICD-10-CM | POA: Diagnosis not present

## 2023-01-18 ENCOUNTER — Ambulatory Visit: Payer: Medicare Other | Admitting: Family Medicine

## 2023-01-18 ENCOUNTER — Encounter: Payer: Self-pay | Admitting: Family Medicine

## 2023-01-18 VITALS — BP 136/78 | HR 74

## 2023-01-18 DIAGNOSIS — E538 Deficiency of other specified B group vitamins: Secondary | ICD-10-CM | POA: Diagnosis not present

## 2023-01-18 MED ORDER — CYANOCOBALAMIN 1000 MCG/ML IJ SOLN
1000.0000 ug | Freq: Once | INTRAMUSCULAR | Status: AC
  Administered 2023-01-18: 1000 ug via INTRAMUSCULAR

## 2023-01-18 NOTE — Progress Notes (Signed)
Injection was tolerated well by the patient. (See MAR for injection details)

## 2023-01-18 NOTE — Progress Notes (Signed)
Agree with documentation as above.   Teonna Coonan, MD  

## 2023-02-13 ENCOUNTER — Ambulatory Visit (INDEPENDENT_AMBULATORY_CARE_PROVIDER_SITE_OTHER): Payer: Medicare Other | Admitting: Sports Medicine

## 2023-02-13 VITALS — BP 149/77 | HR 67 | Ht 67.0 in | Wt 182.0 lb

## 2023-02-13 DIAGNOSIS — J0100 Acute maxillary sinusitis, unspecified: Secondary | ICD-10-CM

## 2023-02-13 MED ORDER — PREDNISONE 50 MG PO TABS: ORAL_TABLET | ORAL | 0 refills | Status: DC

## 2023-02-13 MED ORDER — AMOXICILLIN-POT CLAVULANATE 875-125 MG PO TABS: 1.0000 | ORAL_TABLET | Freq: Two times a day (BID) | ORAL | 0 refills | Status: DC

## 2023-02-13 NOTE — Progress Notes (Signed)
    Procedures performed today:    None.  Independent interpretation of notes and tests performed by another provider:   None.  Brief History, Exam, Impression, and Recommendations:    Acute maxillary sinusitis Pleasant 72 year old male, increasing facial pain and pressure, bilateral maxillary sinuses with Azle discharge, as well as postnasal drip, sore throat and coughing. Recently seen by Dr. Ashley Royalty, COVID and flu were negative last month. On exam today he appears well, speaking full sentences. He does have facial pain and pressure and mild conjunctival injection. Lungs are clear, neck exam is unremarkable. We will treat with Augmentin and prednisone, he does understand that the treatment will take care of the bacterial component but if this is predominantly a virus that it we will just have to run its course. He can return to see me as needed.  Chronic process with recurrence and pharmacologic intervention  ____________________________________________ Ihor Austin. Benjamin Stain, M.D., ABFM., CAQSM., AME. Primary Care and Sports Medicine [facility]  Adjunct Professor of Family Medicine  [employer]  Restaurant manager, fast food

## 2023-02-13 NOTE — Assessment & Plan Note (Signed)
Pleasant 72 year old male, increasing facial pain and pressure, bilateral maxillary sinuses with Azle discharge, as well as postnasal drip, sore throat and coughing. Recently seen by Dr. Ashley Royalty, COVID and flu were negative last month. On exam today he appears well, speaking full sentences. He does have facial pain and pressure and mild conjunctival injection. Lungs are clear, neck exam is unremarkable. We will treat with Augmentin and prednisone, he does understand that the treatment will take care of the bacterial component but if this is predominantly a virus that it we will just have to run its course. He can return to see me as needed.

## 2023-02-20 ENCOUNTER — Ambulatory Visit (HOSPITAL_BASED_OUTPATIENT_CLINIC_OR_DEPARTMENT_OTHER): Payer: Medicare Other | Admitting: Family Medicine

## 2023-02-20 ENCOUNTER — Encounter: Payer: Self-pay | Admitting: Family Medicine

## 2023-02-20 ENCOUNTER — Ambulatory Visit (INDEPENDENT_AMBULATORY_CARE_PROVIDER_SITE_OTHER): Payer: Medicare Other | Admitting: Family Medicine

## 2023-02-20 ENCOUNTER — Encounter (HOSPITAL_BASED_OUTPATIENT_CLINIC_OR_DEPARTMENT_OTHER): Payer: Self-pay

## 2023-02-20 VITALS — BP 127/72 | HR 61 | Temp 98.6°F | Ht 67.0 in | Wt 185.0 lb

## 2023-02-20 DIAGNOSIS — J40 Bronchitis, not specified as acute or chronic: Secondary | ICD-10-CM | POA: Diagnosis not present

## 2023-02-20 DIAGNOSIS — J329 Chronic sinusitis, unspecified: Secondary | ICD-10-CM | POA: Diagnosis not present

## 2023-02-20 MED ORDER — DOXYCYCLINE HYCLATE 100 MG PO TABS: 100.0000 mg | ORAL_TABLET | Freq: Two times a day (BID) | ORAL | 0 refills | Status: DC

## 2023-02-20 MED ORDER — PREDNISONE 20 MG PO TABS: 40.0000 mg | ORAL_TABLET | Freq: Every day | ORAL | 0 refills | Status: DC

## 2023-02-20 MED ORDER — HYDROCODONE BIT-HOMATROP MBR 5-1.5 MG/5ML PO SOLN: 5.0000 mL | Freq: Three times a day (TID) | ORAL | 0 refills | Status: DC | PRN

## 2023-02-20 NOTE — Progress Notes (Signed)
Pt reports that he was seen by Dr. Karie Schwalbe on 12/23 for this and was treated with Augmentin. He states that this hasn't helped with his sxs and he has 2 days left. He was also given prednisone for 5 days.

## 2023-02-20 NOTE — Progress Notes (Signed)
Acute Office Visit  Subjective:     Patient ID: Frederick Rush, male    DOB: [birth year], 72 y.o.   MRN: [id number]  Chief Complaint  Patient presents with   Sinusitis    HPI Patient is in today for follow-up of acute maxillary sinusitis.  He was seen on December 23 with bilateral maxillary sinus facial pain and pressure and postnasal drip sore throat and coughing.  He was given a prescription for Augmentin and has been taking it consistently but does not feel like he is improving. No fever.    Still getting clear and green mucous having a lot of nasal congestion.    He has been using his inhaler that does seem to help him move some mucus out of his chest.  ROS      Objective:    BP 127/72   Pulse 61   Temp 98.6 F (37 C)   Ht 5\' 7"  (1.702 m)   Wt 185 lb (83.9 kg)   SpO2 98%   BMI 28.98 kg/m    Physical Exam Constitutional:      Appearance: Normal appearance.  HENT:     Head: Normocephalic and atraumatic.     Right Ear: Tympanic membrane, ear canal and external ear normal. There is no impacted cerumen.     Left Ear: Tympanic membrane, ear canal and external ear normal. There is no impacted cerumen.     Nose: Nose normal.     Mouth/Throat:     Pharynx: Oropharynx is clear.  Eyes:     Conjunctiva/sclera: Conjunctivae normal.  Cardiovascular:     Rate and Rhythm: Normal rate and regular rhythm.  Pulmonary:     Effort: Pulmonary effort is normal.     Breath sounds: Wheezing and rhonchi present.  Musculoskeletal:     Cervical back: Neck supple. No tenderness.  Lymphadenopathy:     Cervical: No cervical adenopathy.  Skin:    General: Skin is warm and dry.  Neurological:     Mental Status: He is alert and oriented to person, place, and time.  Psychiatric:        Mood and Affect: Mood normal.     No results found for any visits on 02/20/23.      Assessment & Plan:   Problem List Items Addressed This Visit   None Visit Diagnoses        Sinobronchitis    -  Primary   Relevant Medications   doxycycline (VIBRA-TABS) 100 MG tablet   predniSONE (DELTASONE) 20 MG tablet   HYDROcodone bit-homatropine (HYCODAN) 5-1.5 MG/5ML syrup      Sinobronchitis-he actually feels like his chest is worse and he has wheezing and rhonchi on exam.  He does have an albuterol at home and says that has been helpful.  I am going to switch him to doxycycline and add another round of prednisone and refilled hydrocodone cough syrup that he is using that is expired.  If not better in 1 week we will get chest x-ray call sooner if new or worsening symptoms.  Meds ordered this encounter  Medications   doxycycline (VIBRA-TABS) 100 MG tablet    Sig: Take 1 tablet (100 mg total) by mouth 2 (two) times daily.    Dispense:  14 tablet    Refill:  0   predniSONE (DELTASONE) 20 MG tablet    Sig: Take 2 tablets (40 mg total) by mouth daily with breakfast.    Dispense:  10 tablet  Refill:  0   HYDROcodone bit-homatropine (HYCODAN) 5-1.5 MG/5ML syrup    Sig: Take 5 mLs by mouth every 8 (eight) hours as needed for cough.    Dispense:  75 mL    Refill:  0    No follow-ups on file.  Nani Gasser, MD

## 2023-02-23 LAB — SPECIMEN STATUS REPORT

## 2023-02-23 LAB — IRON: Iron: 70 ug/dL (ref 38–169)

## 2023-02-23 LAB — VITAMIN B12: Vitamin B-12: 217 pg/mL — ABNORMAL LOW (ref 232–1245)

## 2023-02-27 ENCOUNTER — Ambulatory Visit: Payer: Medicare Other | Admitting: Medical-Surgical

## 2023-02-28 ENCOUNTER — Encounter: Payer: Self-pay | Admitting: Family Medicine

## 2023-02-28 ENCOUNTER — Ambulatory Visit (INDEPENDENT_AMBULATORY_CARE_PROVIDER_SITE_OTHER): Payer: Medicare Other | Admitting: Family Medicine

## 2023-02-28 ENCOUNTER — Ambulatory Visit: Payer: Medicare Other

## 2023-02-28 VITALS — BP 119/66 | HR 60 | Temp 98.0°F | Ht 67.0 in | Wt 189.0 lb

## 2023-02-28 DIAGNOSIS — R059 Cough, unspecified: Secondary | ICD-10-CM | POA: Diagnosis not present

## 2023-02-28 DIAGNOSIS — M549 Dorsalgia, unspecified: Secondary | ICD-10-CM | POA: Diagnosis not present

## 2023-02-28 DIAGNOSIS — M546 Pain in thoracic spine: Secondary | ICD-10-CM

## 2023-02-28 DIAGNOSIS — R051 Acute cough: Secondary | ICD-10-CM

## 2023-02-28 MED ORDER — CEFDINIR 300 MG PO CAPS: 300.0000 mg | ORAL_CAPSULE | Freq: Two times a day (BID) | ORAL | 0 refills | Status: DC

## 2023-02-28 NOTE — Progress Notes (Signed)
 Acute Office Visit  Subjective:     Patient ID: Frederick Rush, male    DOB: 1950-05-04, 73 y.o.   MRN: [id number]  Chief Complaint  Patient presents with   Sinusitis   Cough    HPI Patient is in today for continued cough and sinus congestion.  I saw him about 8 days ago and she did, doxycycline , p.o. prednisone  and hydrocodone  cough syrup.  He is still getting some congestion from his nose but he does not feel like it is coming out very well he feels like his cough is a little deeper and a little worse and now he is having some posterior right upper back pain for the last 3 to 4 days.  No new fevers or chills.  His ears are hurting particularly the right one going down towards his throat.  ROS      Objective:    BP 119/66   Pulse 60   Temp 98 F (36.7 C)   Ht 5' 7 (1.702 m)   Wt 189 lb (85.7 kg)   SpO2 97%   BMI 29.60 kg/m    Physical Exam Constitutional:      Appearance: Normal appearance.  HENT:     Head: Normocephalic and atraumatic.     Right Ear: Ear canal and external ear normal. There is no impacted cerumen.     Left Ear: Tympanic membrane, ear canal and external ear normal. There is no impacted cerumen.     Ears:     Comments: Right TM is blocked.      Nose: Nose normal.     Mouth/Throat:     Pharynx: Oropharynx is clear.     Comments: Does have some cobblestoning. Eyes:     Conjunctiva/sclera: Conjunctivae normal.  Cardiovascular:     Rate and Rhythm: Normal rate and regular rhythm.  Pulmonary:     Effort: Pulmonary effort is normal.     Breath sounds: Normal breath sounds.  Musculoskeletal:     Cervical back: Neck supple. No tenderness.  Lymphadenopathy:     Cervical: No cervical adenopathy.  Skin:    General: Skin is warm and dry.  Neurological:     Mental Status: He is alert and oriented to person, place, and time.  Psychiatric:        Mood and Affect: Mood normal.     No results found for any visits on 02/28/23.       Assessment & Plan:   Problem List Items Addressed This Visit   None Visit Diagnoses       Acute cough    -  Primary   Relevant Orders   DG Chest 2 View     Acute right-sided thoracic back pain       Relevant Orders   DG Chest 2 View      Even despite finishing his doxycycline  yesterday he still feels like the symptoms are moving more into his chest and is now having some posterior right back pain.  Lung exam is clear today by medical ahead and get a chest x-ray because of his symptoms.  Will call with results once available.  Meds ordered this encounter  Medications   cefdinir  (OMNICEF ) 300 MG capsule    Sig: Take 1 capsule (300 mg total) by mouth 2 (two) times daily.    Dispense:  14 capsule    Refill:  0    Return if symptoms worsen or fail to improve.  Dorothyann Byars, MD

## 2023-02-28 NOTE — Patient Instructions (Signed)
Dg  

## 2023-02-28 NOTE — Progress Notes (Signed)
 Pt reports that he still has a cough. He feels that this has moved down into his chest. He also c/o ear pain. He has finished the ABX  and prednisone.

## 2023-03-06 NOTE — Progress Notes (Signed)
 Frederick Rush, chest x-ray looks good which is very reassuring.  How are you feeling?

## 2023-03-07 ENCOUNTER — Encounter: Payer: Self-pay | Admitting: Family Medicine

## 2023-03-09 ENCOUNTER — Other Ambulatory Visit: Payer: Self-pay | Admitting: Family Medicine

## 2023-03-09 DIAGNOSIS — I10 Essential (primary) hypertension: Secondary | ICD-10-CM

## 2023-03-10 ENCOUNTER — Other Ambulatory Visit: Payer: Self-pay | Admitting: *Deleted

## 2023-03-10 DIAGNOSIS — M359 Systemic involvement of connective tissue, unspecified: Secondary | ICD-10-CM

## 2023-03-10 DIAGNOSIS — M19041 Primary osteoarthritis, right hand: Secondary | ICD-10-CM

## 2023-03-29 DIAGNOSIS — L282 Other prurigo: Secondary | ICD-10-CM | POA: Diagnosis not present

## 2023-03-29 DIAGNOSIS — L2089 Other atopic dermatitis: Secondary | ICD-10-CM | POA: Diagnosis not present

## 2023-04-03 DIAGNOSIS — R3912 Poor urinary stream: Secondary | ICD-10-CM | POA: Diagnosis not present

## 2023-04-04 NOTE — Telephone Encounter (Unsigned)
Copied from CRM [id number]. Topic: Clinical - Prescription Issue >> Mar 10, 2023  2:29 PM Ivette P wrote: Reason for CRM: Pt called in because he missed a call from Nurse Archie Patten and wanted to let her know he wanted to go with the:  [id number] - [place], [place] - [address] AT [facility] [address] [place] [address] Phone: [phone] Fax: [phone] Hours: Not open 24 hours

## 2023-04-04 NOTE — Telephone Encounter (Signed)
Spoke w/pt and medication sent to preferred pharmacy.

## 2023-04-10 ENCOUNTER — Telehealth: Payer: Self-pay | Admitting: Family Medicine

## 2023-04-10 NOTE — Telephone Encounter (Signed)
Copied from CRM [id number]. Topic: General - Other >> Apr 10, 2023  2:00 PM Alcus Dad H wrote: Reason for CRM: Patient says he received a message to reschedule or cancel appointment on Feb 20th at 1:40pm. I did not see any notes regarding his visit needing to be cancelled or rescheduled, attempted to call CAL twice and did not receive an answer. If patient is needing to reschedule appointment, please contact the patient

## 2023-04-11 ENCOUNTER — Ambulatory Visit: Payer: Medicare Other | Attending: Cardiovascular Disease | Admitting: Cardiovascular Disease

## 2023-04-11 ENCOUNTER — Encounter: Payer: Self-pay | Admitting: Cardiovascular Disease

## 2023-04-11 VITALS — BP 118/72 | HR 59 | Ht 67.0 in | Wt 176.0 lb

## 2023-04-11 DIAGNOSIS — E782 Mixed hyperlipidemia: Secondary | ICD-10-CM | POA: Diagnosis not present

## 2023-04-11 DIAGNOSIS — I2510 Atherosclerotic heart disease of native coronary artery without angina pectoris: Secondary | ICD-10-CM

## 2023-04-11 DIAGNOSIS — I10 Essential (primary) hypertension: Secondary | ICD-10-CM | POA: Diagnosis not present

## 2023-04-11 NOTE — Assessment & Plan Note (Signed)
History of mild nonobstructive coronary disease by cath performed Dr. Herbie Baltimore 07/19/2011.  Patient is completely asymptomatic.

## 2023-04-11 NOTE — Patient Instructions (Signed)

## 2023-04-11 NOTE — Telephone Encounter (Signed)
Pt states he just wanted to know if we were rescheduling appointments due to the expected weather. Explained to patient that we are seeing patients as scheduled but if something changes, we will definitely contact him. Patient is appreciative of the return call.

## 2023-04-11 NOTE — Progress Notes (Signed)
04/11/2023 Frederick Rush   03-Nov-1950  [id number]  Primary Physician Agapito Games, MD Primary Cardiologist: Runell Gess MD Milagros Loll, [place], [place]  HPI:  Frederick Rush is a 73 y.o.   mildly overweight, married, Caucasian male, father of two, grandfather to two grandchildren, who I last saw in the office 06/08/2022.  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  He has a history of noncritical CAD found on cath performed by Dr. Ward Chatters on Jul 15, 2011. His other problems include hyperlipidemia and remote tobacco abuse.    He had a uncomplicated left total knee replacement on 12/31/2018 by Dr. Merlyn Albert which he is rehabilitated from.     He did have an episode of decreased vision in his left eye suggestive of amaurosis fugax.  He did have carotid Dopplers performed 05/11/2022 that showed a 50% stenosis which is eccentric in his left carotid circulation.  I referred him to Dr. Karin Lieu who did a CTA of his neck that did not show obstructive disease and his decision was that the episode was nonvascular and we have decided to treat him medically.  Since I saw him a year ago he is continue to do well.  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  He denies chest pain or shortness of breath.   Current Meds  Medication Sig   albuterol (VENTOLIN HFA) 108 (90 Base) MCG/ACT inhaler INHALE 2 PUFFS INTO THE LUNGS EVERY 6 HOURS AS NEEDED FOR WHEEZING OR SHORTNESS OF BREATH   amLODipine (NORVASC) 10 MG tablet TAKE 1 TABLET(10 MG) BY MOUTH DAILY   cefdinir (OMNICEF) 300 MG capsule Take 1 capsule (300 mg total) by mouth 2 (two) times daily.   dicyclomine (BENTYL) 20 MG tablet Take 20 mg by mouth 3 (three) times daily.   fluticasone (FLONASE) 50 MCG/ACT nasal spray Place 1 spray into both nostrils daily.    HYDROcodone bit-homatropine (HYCODAN) 5-1.5 MG/5ML syrup Take 5 mLs by mouth every 8 (eight) hours as needed for cough.    hydrOXYzine (VISTARIL) 25 MG capsule Take 1 capsule (25 mg total) by mouth every 8 (eight) hours as needed.   lisinopril (ZESTRIL) 20 MG tablet TAKE 1 TABLET(20 MG) BY MOUTH DAILY   meloxicam (MOBIC) 15 MG tablet TAKE 1 TABLET(15 MG) BY MOUTH DAILY AS NEEDED FOR PAIN   mesalamine (APRISO) 0.375 g 24 hr capsule Take 4 capsules (1.5 g total) by mouth every morning. 1500mg =4 capsules   rosuvastatin (CRESTOR) 40 MG tablet TAKE 1 TABLET(40 MG) BY MOUTH DAILY     Allergies  Allergen Reactions   Latex     Pt stated that latex causes itching.   Gabapentin Rash    Social History   Socioeconomic History   Marital status: Married    Spouse name: Zella Ball   Number of children: 2   Years of education: some college   Highest education level: 12th grade  Occupational History   Occupation: BULK DRIVER    Employer: [employer]    Comment: Retired   Occupation: Freight Liner-Part time  Tobacco Use   Smoking status: Former    Current packs/day: 0.00    Types: Cigarettes    Quit date: 07/16/1998    Years since quitting: 24.7   Smokeless tobacco: Never  Vaping Use   Vaping status: Never Used  Substance and Sexual Activity   Alcohol use: Yes    Alcohol/week: 5.0  standard drinks of alcohol    Types: 5 Cans of beer per week   Drug use: No   Sexual activity: Yes    Partners: Female    Birth control/protection: None, Post-menopausal  Other Topics Concern   Not on file  Social History Narrative   Marital status: married x 39 years      Children:  2 children; 3 grandchildren; no gg      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       Lives: Lives with his wife and their pets.      Tobacco:  None; quit in 2000; smoked x 15 years + 20 years = 35 years.      Alcohol:    5 beers a week       Exercise: mows yard.      Seatbelt: 100%; no texting   He enjoys yardwork.   Social Drivers of Health   Financial Resource Strain: Patient Declined  (02/13/2023)   Overall Financial Resource Strain (CARDIA)    Difficulty of Paying Living Expenses: Patient declined  Food Insecurity: No Food Insecurity (02/13/2023)   Hunger Vital Sign    Worried About Running Out of Food in the Last Year: Never true    Ran Out of Food in the Last Year: Never true  Transportation Needs: No Transportation Needs (02/13/2023)   PRAPARE - Administrator, Civil Service (Medical): No    Lack of Transportation (Non-Medical): No  Physical Activity: Unknown (02/13/2023)   Exercise Vital Sign    Days of Exercise per Week: Patient declined    Minutes of Exercise per Session: 30 min  Stress: Stress Concern Present (02/13/2023)   [employer] of Occupational Health - Occupational Stress Questionnaire    Feeling of Stress : To some extent  Social Connections: Unknown (02/13/2023)   Social Connection and Isolation Panel [NHANES]    Frequency of Communication with Friends and Family: More than three times a week    Frequency of Social Gatherings with Friends and Family: Patient declined    Attends Religious Services: Patient declined    Database administrator or Organizations: No    Attends Banker Meetings: Patient declined    Marital Status: Married  Catering manager Violence: Not At Risk (01/13/2023)   Received from [facility]   HITS    Over the last 12 months how often did your partner physically hurt you?: Never    Over the last 12 months how often did your partner insult you or talk down to you?: Patient declined    Over the last 12 months how often did your partner threaten you with physical harm?: Never    Over the last 12 months how often did your partner scream or curse at you?: Rarely     Review of Systems: General: negative for chills, fever, night sweats or weight changes.  Cardiovascular: negative for chest pain, dyspnea on exertion, edema, orthopnea, palpitations, paroxysmal nocturnal dyspnea or shortness of  breath Dermatological: negative for rash Respiratory: negative for cough or wheezing Urologic: negative for hematuria Abdominal: negative for nausea, vomiting, diarrhea, bright red blood per rectum, melena, or hematemesis Neurologic: negative for visual changes, syncope, or dizziness All other systems reviewed and are otherwise negative except as noted above.    Blood pressure 118/72, pulse (!) 59, height 5\' 7"  (1.702 m), weight 176 lb (79.8 kg).  General appearance: alert and no distress  Neck: no adenopathy, no carotid bruit, no JVD, supple, symmetrical, trachea midline, and thyroid not enlarged, symmetric, no tenderness/mass/nodules Lungs: clear to auscultation bilaterally Heart: regular rate and rhythm, S1, S2 normal, no murmur, click, rub or gallop Extremities: extremities normal, atraumatic, no cyanosis or edema Pulses: 2+ and symmetric Skin: Skin color, texture, turgor normal. No rashes or lesions Neurologic: Grossly normal  EKG EKG Interpretation Date/Time:  Tuesday April 11 2023 13:20:27 EST Ventricular Rate:  59 PR Interval:  102 QRS Duration:  88 QT Interval:  414 QTC Calculation: 409 R Axis:   8  Text Interpretation: Sinus bradycardia with short PR Anterior infarct , age undetermined When compared with ECG of 19-Jul-2011 06:41, T wave amplitude has decreased in Anterior leads Confirmed by Nanetta Batty [phone]) on 04/11/2023 1:22:25 PM    ASSESSMENT AND PLAN:   CAD (coronary artery disease), non obstructive by cath 07/19/11, treat medically History of mild nonobstructive coronary disease by cath performed Dr. Herbie Baltimore 07/19/2011.  Patient is completely asymptomatic.  Essential hypertension History of essential hypertension her blood pressure measured today at 118/72.  He is on amlodipine and lisinopril.  Hyperlipidemia History of hyperlipidemia on high-dose rosuvastatin with lipid profile performed 10/11/2022 revealing total cholesterol 112, LDL 47 and HDL  46.     Runell Gess MD [facility], [facility] 04/11/2023 1:34 PM

## 2023-04-11 NOTE — Assessment & Plan Note (Signed)
History of hyperlipidemia on high-dose rosuvastatin with lipid profile performed 10/11/2022 revealing total cholesterol 112, LDL 47 and HDL 46.

## 2023-04-11 NOTE — Assessment & Plan Note (Signed)
History of essential hypertension her blood pressure measured today at 118/72.  He is on amlodipine and lisinopril.

## 2023-04-13 ENCOUNTER — Ambulatory Visit (INDEPENDENT_AMBULATORY_CARE_PROVIDER_SITE_OTHER): Payer: Medicare Other | Admitting: Family Medicine

## 2023-04-13 VITALS — BP 109/63 | HR 64 | Ht 67.0 in | Wt 182.0 lb

## 2023-04-13 DIAGNOSIS — K5000 Crohn's disease of small intestine without complications: Secondary | ICD-10-CM

## 2023-04-13 DIAGNOSIS — I10 Essential (primary) hypertension: Secondary | ICD-10-CM | POA: Diagnosis not present

## 2023-04-13 DIAGNOSIS — I2510 Atherosclerotic heart disease of native coronary artery without angina pectoris: Secondary | ICD-10-CM

## 2023-04-13 DIAGNOSIS — R21 Rash and other nonspecific skin eruption: Secondary | ICD-10-CM | POA: Diagnosis not present

## 2023-04-13 DIAGNOSIS — E538 Deficiency of other specified B group vitamins: Secondary | ICD-10-CM | POA: Diagnosis not present

## 2023-04-13 DIAGNOSIS — R7301 Impaired fasting glucose: Secondary | ICD-10-CM

## 2023-04-13 LAB — POCT GLYCOSYLATED HEMOGLOBIN (HGB A1C): Hemoglobin A1C: 5.4 % (ref 4.0–5.6)

## 2023-04-13 NOTE — Assessment & Plan Note (Signed)
He is still holding off of the mesalamine until he follows back up with dermatology.  He does have an upcoming appointment.

## 2023-04-13 NOTE — Assessment & Plan Note (Signed)
A1c looks great today at 5.4 better than last time so great work in bringing that down.

## 2023-04-13 NOTE — Assessment & Plan Note (Signed)
Recently followed up with Cards and they are referring him to back vascular for carotid stenosis

## 2023-04-13 NOTE — Progress Notes (Signed)
Established Patient Office Visit  Subjective  Patient ID: Frederick Rush, male    DOB: [birth year]  Age: 73 y.o. MRN: [id number]  Chief Complaint  Patient presents with   Hypertension    HPI  Still has a rash that has been following with Dr. Noel Christmas with dermatology.  They have reviewed him using a good moisturizing soap he moisturizes with CeraVe a.  Dr. Noel Christmas wanted some up-to-date blood work.  He says he has not been taking the Vistaril or any other antihistamines recently.  I did see him for sinobronchitis back in January and he says that the antibiotic worked great and he is feeling much better still has a little bit of nasal drainage and a slight cough but otherwise better.    ROS    Objective:     BP 109/63   Pulse 64   Ht 5\' 7"  (1.702 m)   Wt 182 lb (82.6 kg)   SpO2 98%   BMI 28.51 kg/m    Physical Exam Vitals and nursing note reviewed.  Constitutional:      Appearance: Normal appearance.  HENT:     Head: Normocephalic and atraumatic.  Eyes:     Conjunctiva/sclera: Conjunctivae normal.  Cardiovascular:     Rate and Rhythm: Normal rate and regular rhythm.  Pulmonary:     Effort: Pulmonary effort is normal.     Breath sounds: Normal breath sounds.  Skin:    General: Skin is warm and dry.  Neurological:     Mental Status: He is alert.  Psychiatric:        Mood and Affect: Mood normal.      Results for orders placed or performed in visit on 04/13/23  POCT HgB A1C  Result Value Ref Range   Hemoglobin A1C 5.4 4.0 - 5.6 %   HbA1c POC (<> result, manual entry)     HbA1c, POC (prediabetic range)     HbA1c, POC (controlled diabetic range)        The ASCVD Risk score (Arnett DK, et al., 2019) failed to calculate for the following reasons:   The valid total cholesterol range is 130 to 320 mg/dL    Assessment & Plan:   Problem List Items Addressed This Visit       Cardiovascular and Mediastinum   CAD (coronary artery disease), non  obstructive by cath 07/19/11, treat medically (Chronic)   Recently followed up with [facility] and they are referring him to back vascular for carotid stenosis      Relevant Orders   CMP14+EGFR   CBC with Differential/Platelet   Lipid panel   Essential hypertension   Well controlled. Continue current regimen. Follow up in  6 mo ,labs today.  Continue amlodipine and lisinopril.      Relevant Orders   CMP14+EGFR   Lipid panel     Endocrine   IFG (impaired fasting glucose) - Primary   A1c looks great today at 5.4 better than last time so great work in bringing that down.      Relevant Orders   POCT HgB A1C (Completed)   CMP14+EGFR   Lipid panel     Other   Crohn disease (HCC) (Chronic)   He is still holding off of the mesalamine until he follows back up with dermatology.  He does have an upcoming appointment.      Relevant Orders   CBC with Differential/Platelet   B12 deficiency   Plant to recheck B12 levels.  He did 2  months of injections up until he got sick in December.  Consider monthly B12 if stable.       Relevant Orders   B12   Other Visit Diagnoses       Rash            Return in about 6 months (around 10/11/2023) for Hypertension, Pre-diabetes.    Nani Gasser, MD

## 2023-04-13 NOTE — Assessment & Plan Note (Addendum)
Plant to recheck B12 levels.  He did 2 months of injections up until he got sick in December.  Consider monthly B12 if stable.

## 2023-04-13 NOTE — Assessment & Plan Note (Addendum)
Well controlled. Continue current regimen. Follow up in  6 mo ,labs today.  Continue amlodipine and lisinopril.

## 2023-04-14 LAB — CMP14+EGFR
ALT: 26 [IU]/L (ref 0–44)
AST: 26 [IU]/L (ref 0–40)
Albumin: 4.5 g/dL (ref 3.8–4.8)
Alkaline Phosphatase: 35 [IU]/L — ABNORMAL LOW (ref 44–121)
BUN/Creatinine Ratio: 19 (ref 10–24)
BUN: 17 mg/dL (ref 8–27)
Bilirubin Total: 0.5 mg/dL (ref 0.0–1.2)
CO2: 24 mmol/L (ref 20–29)
Calcium: 9.3 mg/dL (ref 8.6–10.2)
Chloride: 100 mmol/L (ref 96–106)
Creatinine, Ser: 0.89 mg/dL (ref 0.76–1.27)
Globulin, Total: 1.8 g/dL (ref 1.5–4.5)
Glucose: 87 mg/dL (ref 70–99)
Potassium: 4.3 mmol/L (ref 3.5–5.2)
Sodium: 137 mmol/L (ref 134–144)
Total Protein: 6.3 g/dL (ref 6.0–8.5)
eGFR: 90 mL/min/{1.73_m2} (ref >59)

## 2023-04-14 LAB — CBC WITH DIFFERENTIAL/PLATELET
Basophils Absolute: 0.1 10*3/uL (ref 0.0–0.2)
Basos: 1 %
EOS (ABSOLUTE): 0.1 10*3/uL (ref 0.0–0.4)
Eos: 1 %
Hematocrit: 39.4 % (ref 37.5–51.0)
Hemoglobin: 13.3 g/dL (ref 13.0–17.7)
Immature Grans (Abs): 0.1 10*3/uL (ref 0.0–0.1)
Immature Granulocytes: 1 %
Lymphocytes Absolute: 1.6 10*3/uL (ref 0.7–3.1)
Lymphs: 12 %
MCH: 31.1 pg (ref 26.6–33.0)
MCHC: 33.8 g/dL (ref 31.5–35.7)
MCV: 92 fL (ref 79–97)
Monocytes Absolute: 1.3 10*3/uL — ABNORMAL HIGH (ref 0.1–0.9)
Monocytes: 10 %
Neutrophils Absolute: 9.9 10*3/uL — ABNORMAL HIGH (ref 1.4–7.0)
Neutrophils: 75 %
Platelets: 304 10*3/uL (ref 150–450)
RBC: 4.28 x10E6/uL (ref 4.14–5.80)
RDW: 12.1 % (ref 11.6–15.4)
WBC: 13 10*3/uL — ABNORMAL HIGH (ref 3.4–10.8)

## 2023-04-14 LAB — LIPID PANEL
Chol/HDL Ratio: 2 {ratio} (ref 0.0–5.0)
Cholesterol, Total: 140 mg/dL (ref 100–199)
HDL: 71 mg/dL (ref >39)
LDL Chol Calc (NIH): 52 mg/dL (ref 0–99)
Triglycerides: 94 mg/dL (ref 0–149)
VLDL Cholesterol Cal: 17 mg/dL (ref 5–40)

## 2023-04-14 LAB — VITAMIN B12: Vitamin B-12: 385 pg/mL (ref 232–1245)

## 2023-04-16 ENCOUNTER — Encounter: Payer: Self-pay | Admitting: Family Medicine

## 2023-04-16 NOTE — Progress Notes (Signed)
 HI Frederick Rush, You alk phos still a little elevated but the rest of liver function is normal. White bloods up a little.  Cholesterol looks good.  B12l looks better. Let schedule B12 injection once a month for 6 months and then plan to recheck you level in 6 months.

## 2023-04-19 ENCOUNTER — Telehealth: Payer: Self-pay

## 2023-04-19 DIAGNOSIS — K5010 Crohn's disease of large intestine without complications: Secondary | ICD-10-CM | POA: Diagnosis not present

## 2023-04-19 DIAGNOSIS — R21 Rash and other nonspecific skin eruption: Secondary | ICD-10-CM | POA: Diagnosis not present

## 2023-04-19 NOTE — Telephone Encounter (Signed)
 Patient states labs are needed for dermatology not cardiology. He was informed  that labs have been printed and are ready for pick up.

## 2023-04-19 NOTE — Telephone Encounter (Signed)
 Copied from CRM [id number]. Topic: Clinical - Lab/Test Results >> Apr 19, 2023  9:39 AM Frederick Rush wrote: Reason for CRM: Patient would like to pick up a copy of his most recent lab results to provide to his cardiologist - Please call the patient to confirm when they're ready to pick up.

## 2023-04-20 DIAGNOSIS — K5010 Crohn's disease of large intestine without complications: Secondary | ICD-10-CM | POA: Diagnosis not present

## 2023-04-27 ENCOUNTER — Ambulatory Visit

## 2023-04-27 DIAGNOSIS — E538 Deficiency of other specified B group vitamins: Secondary | ICD-10-CM

## 2023-04-27 MED ORDER — CYANOCOBALAMIN 1000 MCG/ML IJ SOLN
1000.0000 ug | Freq: Once | INTRAMUSCULAR | Status: AC
Start: 2023-04-27 — End: 2023-04-27
  Administered 2023-04-27: 1000 ug via INTRAMUSCULAR

## 2023-04-27 NOTE — Progress Notes (Signed)
 HPI  Pt here today for his B12 injection. Denies medication changes, headaches, of  SOB.                          Assessment and Plan:  Pt received his B12 injection in his RD. Tolerated well. No redness or swelling noted at site. Pt advised to RTC in 2 weeks (around 05/11/23)

## 2023-05-08 ENCOUNTER — Encounter: Payer: Self-pay | Admitting: Vascular Surgery

## 2023-05-09 DIAGNOSIS — Z79899 Other long term (current) drug therapy: Secondary | ICD-10-CM | POA: Diagnosis not present

## 2023-05-09 DIAGNOSIS — M47899 Other spondylosis, site unspecified: Secondary | ICD-10-CM | POA: Diagnosis not present

## 2023-05-09 DIAGNOSIS — R252 Cramp and spasm: Secondary | ICD-10-CM | POA: Diagnosis not present

## 2023-05-09 DIAGNOSIS — M1991 Primary osteoarthritis, unspecified site: Secondary | ICD-10-CM | POA: Diagnosis not present

## 2023-05-09 DIAGNOSIS — L409 Psoriasis, unspecified: Secondary | ICD-10-CM | POA: Diagnosis not present

## 2023-05-09 DIAGNOSIS — K5090 Crohn's disease, unspecified, without complications: Secondary | ICD-10-CM | POA: Diagnosis not present

## 2023-05-16 ENCOUNTER — Ambulatory Visit: Admitting: Family Medicine

## 2023-05-16 ENCOUNTER — Encounter: Payer: Self-pay | Admitting: Family Medicine

## 2023-05-16 ENCOUNTER — Ambulatory Visit (INDEPENDENT_AMBULATORY_CARE_PROVIDER_SITE_OTHER): Admitting: Family Medicine

## 2023-05-16 VITALS — BP 119/67 | HR 52 | Ht 67.0 in | Wt 185.0 lb

## 2023-05-16 DIAGNOSIS — J329 Chronic sinusitis, unspecified: Secondary | ICD-10-CM

## 2023-05-16 DIAGNOSIS — J40 Bronchitis, not specified as acute or chronic: Secondary | ICD-10-CM | POA: Diagnosis not present

## 2023-05-16 LAB — POC COVID19 BINAXNOW: SARS Coronavirus 2 Ag: NEGATIVE

## 2023-05-16 MED ORDER — DOXYCYCLINE HYCLATE 100 MG PO TABS: 100.0000 mg | ORAL_TABLET | Freq: Two times a day (BID) | ORAL | 0 refills | Status: DC

## 2023-05-16 MED ORDER — PREDNISONE 20 MG PO TABS: 40.0000 mg | ORAL_TABLET | Freq: Every day | ORAL | 0 refills | Status: DC

## 2023-05-16 NOTE — Progress Notes (Signed)
 Pt reports that his sxs started 5 days ago.   Sinus pain pressure around his temples, coughing, nasal drainage. He denies any f/s/c. He has been using Flonase and the Albuterol inhaler.

## 2023-05-16 NOTE — Progress Notes (Signed)
   Acute Office Visit  Subjective:     Patient ID: Frederick Rush, male    DOB: Dec 18, 1950, 73 y.o.   MRN: [id number]  Chief Complaint  Patient presents with   Sinusitis    HPI Patient is in today for Cough x 2 weeks. Say started developing a nagging cough about 2 weeks ago, then on Friday had a ST and started feeling bad. No cough is productive. + nasal congestion. Gets a HA in the evenings.    Last chest x-ray on file was from January 2025 for prolonged cough then as well  ROS      Objective:    BP 119/67   Pulse (!) 52   Ht 5\' 7"  (1.702 m)   Wt 185 lb (83.9 kg)   SpO2 97%   BMI 28.98 kg/m    Physical Exam Constitutional:      Appearance: Normal appearance.  HENT:     Head: Normocephalic and atraumatic.     Right Ear: Tympanic membrane, ear canal and external ear normal. There is no impacted cerumen.     Left Ear: Tympanic membrane, ear canal and external ear normal. There is no impacted cerumen.     Nose: Nose normal.     Mouth/Throat:     Pharynx: Oropharynx is clear.  Eyes:     Conjunctiva/sclera: Conjunctivae normal.  Cardiovascular:     Rate and Rhythm: Normal rate and regular rhythm.  Pulmonary:     Effort: Pulmonary effort is normal.     Breath sounds: Rhonchi present.     Comments: Diffuse ronchi Musculoskeletal:     Cervical back: Neck supple. No tenderness.  Lymphadenopathy:     Cervical: No cervical adenopathy.  Skin:    General: Skin is warm and dry.  Neurological:     Mental Status: He is alert and oriented to person, place, and time.  Psychiatric:        Mood and Affect: Mood normal.     No results found for any visits on 05/16/23.      Assessment & Plan:   Problem List Items Addressed This Visit   None Visit Diagnoses       Sinobronchitis    -  Primary   Relevant Medications   predniSONE (DELTASONE) 20 MG tablet   doxycycline (VIBRA-TABS) 100 MG tablet       Symptoms for 5 days with diffuse weak using-though he  technically started coughing about a week and a half before he actually started feeling worse.  Will go ahead and treat for possible COPD exacerbation even though he does not formally have a diagnosis I am suspicious based on previous chest x-rays and the fact that every time he seems to get a chest cold he gets 6 and starts to wheeze and has a prolonged cough.  Like to have him follow back up in 1 month for spirometry.  Meds ordered this encounter  Medications   predniSONE (DELTASONE) 20 MG tablet    Sig: Take 2 tablets (40 mg total) by mouth daily with breakfast.    Dispense:  10 tablet    Refill:  0   doxycycline (VIBRA-TABS) 100 MG tablet    Sig: Take 1 tablet (100 mg total) by mouth 2 (two) times daily.    Dispense:  14 tablet    Refill:  0    Return in about 1 month (around 06/16/2023) for spirometry to evaluate for COPD.  Nani Gasser, MD

## 2023-05-16 NOTE — Addendum Note (Signed)
 Addended by: Deno Etienne on: 05/16/2023 03:34 PM   Modules accepted: Orders

## 2023-05-23 ENCOUNTER — Ambulatory Visit: Payer: Self-pay

## 2023-05-23 ENCOUNTER — Telehealth: Payer: Self-pay | Admitting: Family Medicine

## 2023-05-23 DIAGNOSIS — R0602 Shortness of breath: Secondary | ICD-10-CM

## 2023-05-23 NOTE — Telephone Encounter (Signed)
 Patient states that he is not feeling better and wants to know what else he can take. Please advise.

## 2023-05-23 NOTE — Telephone Encounter (Signed)
 Copied from CRM [id number]. Topic: Clinical - Red Word Triage >> May 23, 2023  8:06 AM Frederick Rush wrote: [facility] that prompted transfer to Nurse Triage: congestion, difficulty breathing.   Chief Complaint: Cough (Productive),  Symptoms: Cough, Dyspnea Frequency: Ongoing  Pertinent Negatives: Patient denies chest pain, pallor, nausea, vomiting, fever  Disposition: [] ED /[] Urgent Care (no appt availability in office) / [] Appointment(In office/virtual)/ []  [facility]/ [] Home Care/ [] Refused Recommended Disposition /[] Frederick Rush Mobile [facility]/ [x]  Follow-up with PCP Additional Notes: Frederick Rush is being triaged for persistent symptoms while being treated with an antibiotic.  Saw Dr. Linford Arnold last Tuesday and was treated with Doxycycline  and prednisone.  The patient has taken the full course of antibiotics and is out of the prescribed medications and states no improvement in his symptoms. The patient also states he is unable to lie flat without experiencing difficulty breathing. Complains of a a lot of congestion he feels like is localized to his chest.   Contacted [facility], informed the patient would receive a callback from the clinical team.   Reason for Disposition  [1] Taking antibiotic > 7 days AND [2] nasal discharge not improved  Answer Assessment - Initial Assessment Questions 1. ANTIBIOTIC: "What antibiotic are you taking?" "How many times a day?"     Doxycycline  2. ONSET: "When was the antibiotic started?"     05-16-2023  3. PAIN: "How bad is the sinus pain?"   (Scale 1-10; mild, moderate or severe)   - MILD (1-3): doesn't interfere with normal activities    - MODERATE (4-7): interferes with normal activities (e.Rush., work or school) or awakens from sleep   - SEVERE (8-10): excruciating pain and patient unable to do any normal activities        No  4. FEVER: "Do you have a fever?" If Yes, ask: "What is it, how was it measured, and when did it start?"      No  5. SYMPTOMS:  "Are there any other symptoms you're concerned about?" If Yes, ask: "When did it start?"     Dyspnea, Cough-Productive  Protocols used: Sinus Infection on Antibiotic Follow-up Call-[facility]

## 2023-05-23 NOTE — Telephone Encounter (Signed)
 See if he can come for chest x-ray in the morning.  To see if he is able to check his oxygen level at home

## 2023-05-24 ENCOUNTER — Ambulatory Visit (INDEPENDENT_AMBULATORY_CARE_PROVIDER_SITE_OTHER)

## 2023-05-24 ENCOUNTER — Telehealth: Payer: Self-pay | Admitting: Family Medicine

## 2023-05-24 ENCOUNTER — Encounter: Payer: Self-pay | Admitting: Family Medicine

## 2023-05-24 ENCOUNTER — Other Ambulatory Visit: Payer: Self-pay | Admitting: Family Medicine

## 2023-05-24 DIAGNOSIS — R0602 Shortness of breath: Secondary | ICD-10-CM | POA: Diagnosis not present

## 2023-05-24 MED ORDER — GUAIFENESIN ER 600 MG PO TB12: 600.0000 mg | ORAL_TABLET | Freq: Two times a day (BID) | ORAL | 0 refills | Status: DC

## 2023-05-24 MED ORDER — HYDROCODONE BIT-HOMATROP MBR 5-1.5 MG/5ML PO SOLN
5.0000 mL | Freq: Three times a day (TID) | ORAL | 0 refills | Status: DC | PRN
Start: 2023-05-24 — End: 2023-05-24

## 2023-05-24 MED ORDER — HYDROCODONE BIT-HOMATROP MBR 5-1.5 MG/5ML PO SOLN: 5.0000 mL | Freq: Three times a day (TID) | ORAL | 0 refills | Status: DC | PRN

## 2023-05-24 NOTE — Telephone Encounter (Signed)
 Patient was seen on 05/16/23 for Sinobronchitis. Patient currently on prednisone and doxycycline. Please advise, thanks.

## 2023-05-24 NOTE — Progress Notes (Signed)
 Hi Keith, chest x-ray is normal with no concerning findings.

## 2023-05-24 NOTE — Telephone Encounter (Signed)
 Meds sent

## 2023-05-24 NOTE — Telephone Encounter (Signed)
 Copied from CRM [id number]. Topic: Clinical - Red Word Triage >> May 23, 2023  8:06 AM [name] wrote: Red Word that prompted transfer to Nurse Triage: congestion, difficulty breathing. >> May 23, 2023  1:34 PM Marlow Baars wrote: The patient called back making sure his provider got the message from the nurse earlier. I assured him she did and he will be getting a call back  05/24/23 0821: Patient called to inquire about chest xray order. RN advised pt to go to image at office [address]. Pt states that he would still lke something for his symptoms today

## 2023-05-24 NOTE — Telephone Encounter (Signed)
 Please see other note. There are multiple messages open

## 2023-05-24 NOTE — Telephone Encounter (Signed)
 Closing note since duplicates

## 2023-05-24 NOTE — Telephone Encounter (Signed)
 Have spoken with patient . He had other questions regarding medication to treat symptoms and these were forwarded to DR. Metheney for review.

## 2023-05-24 NOTE — Telephone Encounter (Signed)
 Patient informed of CXRAY results. States still having SOB, congestin, cough, phlegm greenish to clear in color,  no fevers States having to sleep in chair due to congestion  Requesting a medication for cough and congestion symptoms

## 2023-05-24 NOTE — Progress Notes (Signed)
 Meds ordered this encounter  Medications   HYDROcodone bit-homatropine (HYCODAN) 5-1.5 MG/5ML syrup    Sig: Take 5 mLs by mouth every 8 (eight) hours as needed for cough.    Dispense:  120 mL    Refill:  0   guaiFENesin (MUCINEX) 600 MG 12 hr tablet    Sig: Take 1 tablet (600 mg total) by mouth 2 (two) times daily.    Dispense:  40 tablet    Refill:  0

## 2023-05-25 NOTE — Telephone Encounter (Signed)
Left message advising of medications.  

## 2023-05-29 ENCOUNTER — Ambulatory Visit (INDEPENDENT_AMBULATORY_CARE_PROVIDER_SITE_OTHER): Admitting: Family Medicine

## 2023-05-29 VITALS — BP 125/62 | HR 61 | Resp 20

## 2023-05-29 DIAGNOSIS — E538 Deficiency of other specified B group vitamins: Secondary | ICD-10-CM | POA: Diagnosis not present

## 2023-05-29 MED ORDER — CYANOCOBALAMIN 1000 MCG/ML IJ SOLN
1000.0000 ug | Freq: Once | INTRAMUSCULAR | Status: AC
  Administered 2023-05-29: 1000 ug via INTRAMUSCULAR

## 2023-05-29 NOTE — Progress Notes (Signed)
 Agree with documentation as above.   Nani Gasser, MD

## 2023-05-29 NOTE — Progress Notes (Signed)
 Patient is here for a vitamin B12 injection. Denies muscle cramps, weakness or irregular heart rate.  Patient advised to schedule next injection for 30 days.

## 2023-06-22 ENCOUNTER — Ambulatory Visit (INDEPENDENT_AMBULATORY_CARE_PROVIDER_SITE_OTHER): Admitting: Family Medicine

## 2023-06-22 ENCOUNTER — Ambulatory Visit: Admitting: Family Medicine

## 2023-06-22 VITALS — BP 137/72 | HR 66 | Ht 67.0 in | Wt 185.0 lb

## 2023-06-22 DIAGNOSIS — R058 Other specified cough: Secondary | ICD-10-CM

## 2023-06-22 DIAGNOSIS — J449 Chronic obstructive pulmonary disease, unspecified: Secondary | ICD-10-CM | POA: Diagnosis not present

## 2023-06-22 DIAGNOSIS — R0602 Shortness of breath: Secondary | ICD-10-CM | POA: Diagnosis not present

## 2023-06-22 DIAGNOSIS — I10 Essential (primary) hypertension: Secondary | ICD-10-CM

## 2023-06-22 MED ORDER — ALBUTEROL SULFATE (2.5 MG/3ML) 0.083% IN NEBU
2.5000 mg | INHALATION_SOLUTION | Freq: Once | RESPIRATORY_TRACT | Status: AC
Start: 2023-06-22 — End: ?

## 2023-06-22 MED ORDER — VALSARTAN 40 MG PO TABS: 40.0000 mg | ORAL_TABLET | Freq: Every day | ORAL | 3 refills | Status: DC

## 2023-06-22 NOTE — Progress Notes (Signed)
 Pt here for spirometry test for SOB.                             Spirometry test performed on pt. Results printed off and given to Dr. Greer Leak for review and next steps

## 2023-06-22 NOTE — Addendum Note (Signed)
 Addended by: Evarose Altland D on: 06/22/2023 03:47 PM   Modules accepted: Orders

## 2023-06-22 NOTE — Patient Instructions (Signed)
 Stop the lisinopril  and you are can to take valsartan  in its place.  I sent a new prescription over to the pharmacy.  Just monitor your blood pressures over the next couple of weeks and make sure that they are still good.  We are going to see if this improves your cough over the next 3 to 4 weeks.  If it is not helping then going to refer you to a pulmonologist.

## 2023-06-22 NOTE — Progress Notes (Addendum)
 Established Patient Office Visit  Subjective  Patient ID: Frederick Rush, male    DOB: 1950-03-11  Age: 73 y.o. MRN: [id number]  Chief Complaint  Patient presents with   spirometry test    Pt has mild COPD    HPI  He is here today to follow-up for chronic cough.  His symptoms really started back in the fall in November when he got sick.  We actually saw him in December for sinobronchitis, he started to feel a little better but ended up returning in January for persistent cough.  And then was seen again in March for sinus infection and bronchitis.  Since then he has just had a persistent dry cough it always seems worse in the afternoons.  He does not get any shortness of breath with it and rarely gets a little bit of sputum or mucus.  He otherwise feels well.  He did wonder let me know he is been off the mesalamine  now for about 6 to 7 months they thought that it might be causing his rash.  Though the dermatologist ended up diagnosing him with some arthritis this.  He does follow back up with Dr. Danella Dunn at the end of the month.  He says the cough feels like it is in the back of his throat.    ROS    Objective:     BP 137/72 (BP Location: Left Arm, Patient Position: Sitting, Cuff Size: Large)   Pulse 66   Ht 5\' 7"  (1.702 m)   Wt 185 lb (83.9 kg)   SpO2 97%   BMI 28.98 kg/m    Physical Exam Vitals and nursing note reviewed.  Constitutional:      Appearance: Normal appearance.  HENT:     Head: Normocephalic and atraumatic.  Eyes:     Conjunctiva/sclera: Conjunctivae normal.  Cardiovascular:     Rate and Rhythm: Normal rate and regular rhythm.  Pulmonary:     Effort: Pulmonary effort is normal.     Breath sounds: Normal breath sounds.  Skin:    General: Skin is warm and dry.  Neurological:     Mental Status: He is alert.  Psychiatric:        Mood and Affect: Mood normal.      No results found for any visits on 06/22/23.    The 10-year ASCVD risk score  (Arnett DK, et al., 2019) is: 23.6%    Assessment & Plan:   Problem List Items Addressed This Visit       Cardiovascular and Mediastinum   Essential hypertension   RF meds.BP at goal today. Will monitor.   Dry cough - consider could be side effect of his ACE, will change to ARB       Relevant Medications   valsartan  (DIOVAN ) 40 MG tablet     Respiratory   COPD, mild (HCC)   06/23/23: Spirometry FVC 112%, FEV1 89%, ratio of 57%, mild COPD  Discussed and review results together Recommend flu shot year.   Use albuterol  PRN.        Relevant Medications   albuterol  (PROVENTIL ) (2.5 MG/3ML) 0.083% nebulizer solution 2.5 mg     Other   SOB (shortness of breath) - Primary   Relevant Medications   albuterol  (PROVENTIL ) (2.5 MG/3ML) 0.083% nebulizer solution 2.5 mg   Dry cough    Dry cough - consider could be side effect of his ACE, will change to ARB    No follow-ups on file.  Duaine German, MD

## 2023-06-23 DIAGNOSIS — J449 Chronic obstructive pulmonary disease, unspecified: Secondary | ICD-10-CM | POA: Insufficient documentation

## 2023-06-23 NOTE — Addendum Note (Signed)
 Addended by: Kennede Lusk D on: 06/23/2023 12:25 PM   Modules accepted: Orders, Level of Service

## 2023-06-23 NOTE — Assessment & Plan Note (Addendum)
 06/23/23: Spirometry FVC 112%, FEV1 89%, ratio of 57%, mild COPD  Discussed and review results together Recommend flu shot year.   Use albuterol  PRN.

## 2023-06-23 NOTE — Assessment & Plan Note (Addendum)
 RF meds.BP at goal today. Will monitor.   Dry cough - consider could be side effect of his ACE, will change to ARB

## 2023-06-28 ENCOUNTER — Ambulatory Visit (INDEPENDENT_AMBULATORY_CARE_PROVIDER_SITE_OTHER): Admitting: Family Medicine

## 2023-06-28 VITALS — BP 127/64 | HR 65

## 2023-06-28 DIAGNOSIS — E538 Deficiency of other specified B group vitamins: Secondary | ICD-10-CM | POA: Diagnosis not present

## 2023-06-28 MED ORDER — CYANOCOBALAMIN 1000 MCG/ML IJ SOLN
1000.0000 ug | Freq: Once | INTRAMUSCULAR | Status: AC
  Administered 2023-06-28: 1000 ug via INTRAMUSCULAR

## 2023-06-28 NOTE — Progress Notes (Signed)
 Pt came in today for B12 injection.   Injection given in RD. Pt  tolerated well.   Pt reports no negative side effects from medication.   Denies any dizziness, chest pain or palpitations, and no GI problems.  He will RTC in 1 month for next injection.

## 2023-07-04 ENCOUNTER — Encounter: Payer: Self-pay | Admitting: Family Medicine

## 2023-07-12 ENCOUNTER — Other Ambulatory Visit: Payer: Self-pay | Admitting: Cardiovascular Disease

## 2023-07-12 DIAGNOSIS — I2510 Atherosclerotic heart disease of native coronary artery without angina pectoris: Secondary | ICD-10-CM

## 2023-07-26 DIAGNOSIS — D225 Melanocytic nevi of trunk: Secondary | ICD-10-CM | POA: Diagnosis not present

## 2023-07-26 DIAGNOSIS — L578 Other skin changes due to chronic exposure to nonionizing radiation: Secondary | ICD-10-CM | POA: Diagnosis not present

## 2023-07-26 DIAGNOSIS — D2339 Other benign neoplasm of skin of other parts of face: Secondary | ICD-10-CM | POA: Diagnosis not present

## 2023-07-26 DIAGNOSIS — Z08 Encounter for follow-up examination after completed treatment for malignant neoplasm: Secondary | ICD-10-CM | POA: Diagnosis not present

## 2023-07-26 DIAGNOSIS — Z85828 Personal history of other malignant neoplasm of skin: Secondary | ICD-10-CM | POA: Diagnosis not present

## 2023-07-26 DIAGNOSIS — D485 Neoplasm of uncertain behavior of skin: Secondary | ICD-10-CM | POA: Diagnosis not present

## 2023-07-28 ENCOUNTER — Ambulatory Visit (INDEPENDENT_AMBULATORY_CARE_PROVIDER_SITE_OTHER): Admitting: Medical-Surgical

## 2023-07-28 ENCOUNTER — Encounter: Payer: Self-pay | Admitting: Medical-Surgical

## 2023-07-28 VITALS — BP 113/62 | HR 65 | Resp 20 | Ht 67.0 in | Wt 173.0 lb

## 2023-07-28 DIAGNOSIS — J0100 Acute maxillary sinusitis, unspecified: Secondary | ICD-10-CM | POA: Diagnosis not present

## 2023-07-28 MED ORDER — CEFDINIR 300 MG PO CAPS: 300.0000 mg | ORAL_CAPSULE | Freq: Two times a day (BID) | ORAL | 0 refills | Status: DC

## 2023-07-28 MED ORDER — HYDROCOD POLI-CHLORPHE POLI ER 10-8 MG/5ML PO SUER: 5.0000 mL | Freq: Two times a day (BID) | ORAL | 0 refills | Status: DC | PRN

## 2023-07-28 MED ORDER — ALBUTEROL SULFATE HFA 108 (90 BASE) MCG/ACT IN AERS
2.0000 | INHALATION_SPRAY | Freq: Four times a day (QID) | RESPIRATORY_TRACT | 2 refills | Status: AC | PRN
Start: 2023-07-28 — End: ?

## 2023-07-28 NOTE — Progress Notes (Signed)
        Established patient visit  History, exam, impression, and plan:  1. Acute non-recurrent maxillary sinusitis (Primary) Very pleasant 73 year old male presenting today with reports of 6-7 days of upper respiratory symptoms including rhinorrhea, headache, sinus congestion, cough productive of greenish-yellow sputum, and generalized malaise.  He has tried taking Mucinex  but that only provided a small amount of temporary relief.  Having difficulty sleeping at night due to severe cough and is having to try to sleep on the couch when he is sitting up.  See below for physical exam.  Symptoms consistent with maxillary sinusitis so plan to treat with cefdinir  300 mg twice daily for 7 days.  Adding Tussionex for cough management.  Refilling his albuterol  inhaler as he only has 12 puffs left. - cefdinir  (OMNICEF ) 300 MG capsule; Take 1 capsule (300 mg total) by mouth 2 (two) times daily.  Dispense: 14 capsule; Refill: 0 - chlorpheniramine-HYDROcodone  (TUSSIONEX) 10-8 MG/5ML; Take 5 mLs by mouth every 12 (twelve) hours as needed for cough (cough, will cause drowsiness.).  Dispense: 115 mL; Refill: 0 - albuterol  (VENTOLIN  HFA) 108 (90 Base) MCG/ACT inhaler; Inhale 2 puffs into the lungs every 6 (six) hours as needed for wheezing or shortness of breath.  Dispense: 18 g; Refill: 2  Procedures performed this visit: None.  Return if symptoms worsen or fail to improve.  __________________________________ Maryl Snook, DNP, APRN, FNP-BC Primary Care and Sports Medicine [facility]

## 2023-07-31 ENCOUNTER — Ambulatory Visit (INDEPENDENT_AMBULATORY_CARE_PROVIDER_SITE_OTHER)

## 2023-07-31 ENCOUNTER — Ambulatory Visit: Admitting: Physician Assistant

## 2023-07-31 DIAGNOSIS — E538 Deficiency of other specified B group vitamins: Secondary | ICD-10-CM

## 2023-07-31 MED ORDER — CYANOCOBALAMIN 1000 MCG/ML IJ SOLN
1000.0000 ug | Freq: Once | INTRAMUSCULAR | Status: AC
Start: 2023-07-31 — End: 2023-07-31
  Administered 2023-07-31: 1000 ug via INTRAMUSCULAR

## 2023-07-31 NOTE — Progress Notes (Signed)
 Pt here for B12 injection. Denies CP, SOB, medication changes.                               Pt given B12 injection in LD. Tolerated well. No redness or swelling noted at the site. Pt advised to RTC in 1 month around 08/30/23.

## 2023-08-10 ENCOUNTER — Ambulatory Visit: Admitting: Vascular Surgery

## 2023-08-10 ENCOUNTER — Encounter (HOSPITAL_COMMUNITY)

## 2023-08-17 ENCOUNTER — Encounter (HOSPITAL_COMMUNITY)

## 2023-08-17 ENCOUNTER — Ambulatory Visit: Admitting: Vascular Surgery

## 2023-09-05 ENCOUNTER — Other Ambulatory Visit: Payer: Self-pay

## 2023-09-05 DIAGNOSIS — I6523 Occlusion and stenosis of bilateral carotid arteries: Secondary | ICD-10-CM

## 2023-09-06 ENCOUNTER — Ambulatory Visit
Admission: EM | Admit: 2023-09-06 | Discharge: 2023-09-06 | Disposition: A | Discharge status: Home / Self Care | Attending: Family Medicine | Admitting: Family Medicine

## 2023-09-06 DIAGNOSIS — T63441A Toxic effect of venom of bees, accidental (unintentional), initial encounter: Secondary | ICD-10-CM

## 2023-09-06 DIAGNOSIS — R220 Localized swelling, mass and lump, head: Secondary | ICD-10-CM | POA: Diagnosis not present

## 2023-09-06 DIAGNOSIS — R21 Rash and other nonspecific skin eruption: Secondary | ICD-10-CM | POA: Diagnosis not present

## 2023-09-06 MED ORDER — METHYLPREDNISOLONE ACETATE 80 MG/ML IJ SUSP
80.0000 mg | Freq: Once | INTRAMUSCULAR | Status: AC
  Administered 2023-09-06: 80 mg via INTRAMUSCULAR

## 2023-09-06 MED ORDER — FEXOFENADINE HCL 180 MG PO TABS: 180.0000 mg | ORAL_TABLET | Freq: Every day | ORAL | 0 refills | Status: DC

## 2023-09-06 MED ORDER — PREDNISONE 20 MG PO TABS: ORAL_TABLET | ORAL | 0 refills | Status: DC

## 2023-09-06 NOTE — Discharge Instructions (Addendum)
 Advised patient to take medications as directed with food to completion.  Advised patient to take Allegra  with prednisone  daily for the next 5 days.  Advised may use Allegra  as needed for future insect stings.  Encouraged to increase daily water intake to 64 ounces per day while taking these medications.  Advised if symptoms worsen and/or unresolved please follow-up with your PCP or here for further evaluation.

## 2023-09-06 NOTE — ED Provider Notes (Signed)
 [facility]    CSN: [id number] Arrival date & time: 09/06/23  1453      History   Chief Complaint Chief Complaint  Patient presents with   Insect Bite    HPI Frederick Rush is Rush 73 y.o. male.   HPI Very pleasant 73 year old male presents with multiple wasp stings that occurred at his house 45 minutes ago.  Patient was stung on the right side of face, upper chest and left arm.  Patient is accompanied by his wife this afternoon.  PMH significant for CAD (s/p MI) HLD, and polyarthralgia.  Past Medical History:  Diagnosis Date   Arthritis    CAD (coronary artery disease), non obstructive by cath 07/19/11, treat medically 07/19/2011   Crohn disease (HCC)    Hypercholesterolemia    Hyperlipidemia    Myocardial infarction [facility])    Skin cancer 2006   Left shoulder; followed every six months.      Patient Active Problem List   Diagnosis Date Noted   COPD, mild (HCC) 06/23/2023   Dry cough 06/22/2023   B12 deficiency 04/13/2023   SOB (shortness of breath) 11/16/2022   Hyperlipidemia 06/08/2022   Spondylosis without myelopathy 05/20/2021   Primary localized osteoarthrosis of multiple sites 05/20/2021   Polyarthralgia 05/20/2021   Enteropathic arthropathy 03/01/2019   IFG (impaired fasting glucose) 02/19/2019   OA (osteoarthritis) of knee 12/31/2018   Osteoarthritis of both hands 10/07/2015   Essential hypertension 04/24/2014   Diffuse connective tissue disease (HCC) 07/23/2013   CAD (coronary artery disease), non obstructive by cath 07/19/11, treat medically 07/19/2011   History of smoking 07/16/2011   Family history of coronary artery bypass surgery 07/16/2011   Crohn disease (HCC) 07/16/2011    Past Surgical History:  Procedure Laterality Date   APPENDECTOMY     CARDIAC CATHETERIZATION  07/15/2011   Medical management   ELBOW SURGERY Right    FRACTURE SURGERY Right 2015   elbow   HERNIA REPAIR     KNEE SURGERY     arthroscopy   LEFT HEART  CATHETERIZATION WITH CORONARY ANGIOGRAM  07/19/2011   Procedure: LEFT HEART CATHETERIZATION WITH CORONARY ANGIOGRAM;  Surgeon: Alm LELON Clay, MD;  Location: [facility] CATH LAB;  Service: Cardiovascular;;   TONSILLECTOMY     TOTAL KNEE ARTHROPLASTY Left 12/31/2018   Procedure: TOTAL KNEE ARTHROPLASTY;  Surgeon: Melodi Lerner, MD;  Location: [facility] ORS;  Service: Orthopedics;  Laterality: Left;    VASECTOMY         Home Medications    Prior to Admission medications   Medication Sig Start Date End Date Taking? Authorizing Provider  albuterol  (VENTOLIN  HFA) 108 (90 Base) MCG/ACT inhaler Inhale 2 puffs into the lungs every 6 (six) hours as needed for wheezing or shortness of breath. 07/28/23  Yes Frederick Mini, NP  amLODipine  (NORVASC ) 10 MG tablet TAKE 1 TABLET(10 MG) BY MOUTH DAILY 03/09/23  Yes Frederick Dorothyann BIRCH, MD  atorvastatin  (LIPITOR) 10 MG tablet Take 10 mg by mouth daily.   Yes [provider]  cefdinir  (OMNICEF ) 300 MG capsule Take 1 capsule (300 mg total) by mouth 2 (two) times daily. 07/28/23  Yes Frederick Mini, NP  chlorpheniramine-HYDROcodone  (TUSSIONEX) 10-8 MG/5ML Take 5 mLs by mouth every 12 (twelve) hours as needed for cough (cough, will cause drowsiness.). 07/28/23  Yes Frederick Mini, NP  dicyclomine (BENTYL) 20 MG tablet Take 20 mg by mouth 3 (three) times daily. 04/08/22  Yes [provider]  fexofenadine  (ALLEGRA  ALLERGY) 180 MG tablet  Take 1 tablet (180 mg total) by mouth daily for 15 days. 09/06/23 09/21/23 Yes Frederick Sharper, FNP  fluticasone  (FLONASE ) 50 MCG/ACT nasal spray Place 1 spray into both nostrils daily.    Yes [provider]  meloxicam  (MOBIC ) 15 MG tablet TAKE 1 TABLET(15 MG) BY MOUTH DAILY AS NEEDED FOR PAIN 10/11/22  Yes Frederick Dorothyann BIRCH, MD  mesalamine  (APRISO ) 0.375 g 24 hr capsule Take 4 capsules (1.5 g total) by mouth every morning. 1500mg =4 capsules 10/14/17  Yes Frederick, [name], MD  predniSONE  (DELTASONE ) 20 MG tablet Take 3 tabs PO  daily x 5 days. 09/06/23  Yes Frederick Sharper, FNP  valsartan  (DIOVAN ) 40 MG tablet Take 1 tablet (40 mg total) by mouth daily. 06/22/23  Yes Frederick Dorothyann BIRCH, MD    Family History Family History  Problem Relation Age of Onset   Cancer Mother        Breast cancer; leukemia   Heart disease Mother 59       CABG   Hypertension Brother     Social History Social History   Tobacco Use   Smoking status: Former    Current packs/day: 0.00    Types: Cigarettes    Quit date: 07/16/1998    Years since quitting: 25.1   Smokeless tobacco: Never  Vaping Use   Vaping status: Never Used  Substance Use Topics   Alcohol  use: Yes    Alcohol /week: 5.0 standard drinks of alcohol     Types: 5 Cans of beer per week   Drug use: No     Allergies   Latex, Gabapentin , and Rosuvastatin    Review of Systems Review of Systems  HENT:  Positive for facial swelling.   Skin:  Positive for rash.  All other systems reviewed and are negative.    Physical Exam Triage Vital Signs ED Triage Vitals  Encounter Vitals Group     BP 09/06/23 1501 117/70     Girls Systolic BP Percentile --      Girls Diastolic BP Percentile --      Boys Systolic BP Percentile --      Boys Diastolic BP Percentile --      Pulse Rate 09/06/23 1501 61     Resp 09/06/23 1501 16     Temp 09/06/23 1501 98.2 F (36.8 C)     Temp Source 09/06/23 1501 Oral     SpO2 09/06/23 1501 96 %     Weight 09/06/23 1459 170 lb 11.2 oz (77.4 kg)     Height 09/06/23 1459 5' 8 (1.727 m)     Head Circumference --      Peak Flow --      Pain Score 09/06/23 1459 0     Pain Loc --      Pain Education --      Exclude from Growth Chart --    No data found.  Updated Vital Signs BP 117/70 (BP Location: Right Arm)   Pulse 61   Temp 98.2 F (36.8 C) (Oral)   Resp 16   Ht 5' 8 (1.727 m)   Wt 170 lb 11.2 oz (77.4 kg)   SpO2 96%   BMI 25.95 kg/m    Physical Exam Vitals and nursing note reviewed.  Constitutional:      General: He  is not in acute distress.    Appearance: Normal appearance. He is normal weight. He is not ill-appearing, toxic-appearing or diaphoretic.  HENT:     Head: Normocephalic and atraumatic.  Mouth/Throat:     Mouth: Mucous membranes are moist.     Pharynx: Oropharynx is clear.  Eyes:     Extraocular Movements: Extraocular movements intact.     Conjunctiva/sclera: Conjunctivae normal.     Pupils: Pupils are equal, round, and reactive to light.  Cardiovascular:     Rate and Rhythm: Normal rate and regular rhythm.     Pulses: Normal pulses.     Heart sounds: Normal heart sounds.  Pulmonary:     Effort: Pulmonary effort is normal.     Breath sounds: Normal breath sounds.  Abdominal:     General: Abdomen is flat. Bowel sounds are normal.     Palpations: Abdomen is soft.  Musculoskeletal:        General: Normal range of motion.     Cervical back: Normal range of motion and neck supple.  Skin:    General: Skin is warm and dry.     Comments: Face (right-sided inferior orbit area): Moderate soft tissue swelling (large bullae) noted from wasp sting please see image below  Right upper arm (superior medial aspect): 2 targeted wasp stings noted with surrounding erythema please see image below  Right sided upper chest: Erythematous plaque please see image below  Neurological:     General: No focal deficit present.     Mental Status: He is alert and oriented to person, place, and time. Mental status is at baseline.  Psychiatric:        Mood and Affect: Mood normal.        Behavior: Behavior normal.           UC Treatments / Results  Labs (all labs ordered are listed, but only abnormal results are displayed) Labs Reviewed - No data to display  EKG   Radiology No results found.  Procedures Procedures (including critical care time)  Medications Ordered in UC Medications  methylPREDNISolone  acetate (DEPO-MEDROL ) injection 80 mg (80 mg Intramuscular Given 09/06/23 1519)     Initial Impression / Assessment and Plan / UC Course  I have reviewed the triage vital signs and the nursing notes.  Pertinent labs & imaging results that were available during my care of the patient were reviewed by me and considered in my medical decision making (see chart for details).     MDM: 1.  Right facial swelling-IM Depo-Medrol  80 mg given once in clinic; 2.  Rash and nonspecific skin eruption-Rx'd prednisone  20 mg tablet: Take 3 tablets p.o. daily x 5 days; 3.  Bee sting reaction, accidental or unintentional, initial encounter-Rx'd Allegra  180 mg tablet: Take 1 tablet daily x 5 days. Advised patient to take medications as directed with food to completion.  Advised patient to take Allegra  with prednisone  daily for the next 5 days.  Advised may use Allegra  as needed for future insect stings.  Encouraged to increase daily water intake to 64 ounces per day while taking these medications.  Advised if symptoms worsen and/or unresolved please follow-up with your PCP or here for further evaluation. Final Clinical Impressions(s) / UC Diagnoses   Final diagnoses:  Bee sting reaction, accidental or unintentional, initial encounter  Right facial swelling  Rash and nonspecific skin eruption     Discharge Instructions      Advised patient to take medications as directed with food to completion.  Advised patient to take Allegra  with prednisone  daily for the next 5 days.  Advised may use Allegra  as needed for future insect stings.  Encouraged to increase daily water  intake to 64 ounces per day while taking these medications.  Advised if symptoms worsen and/or unresolved please follow-up with your PCP or here for further evaluation.     ED Prescriptions     Medication Sig Dispense Auth. Provider   predniSONE  (DELTASONE ) 20 MG tablet Take 3 tabs PO daily x 5 days. 15 tablet Zakariyah Freimark, FNP   fexofenadine  (ALLEGRA  ALLERGY) 180 MG tablet Take 1 tablet (180 mg total) by mouth daily for  15 days. 15 tablet Adonis Yim, FNP      PDMP not reviewed this encounter.   Frederick Sharper, FNP 09/06/23 1553

## 2023-09-06 NOTE — ED Triage Notes (Signed)
 Pt states that he was stung by a wasp multiple times. Pt states that he was stung in the face, chest and left arm. X1 day

## 2023-09-11 ENCOUNTER — Ambulatory Visit (INDEPENDENT_AMBULATORY_CARE_PROVIDER_SITE_OTHER)

## 2023-09-11 VITALS — BP 128/57 | HR 64 | Ht 68.0 in

## 2023-09-11 DIAGNOSIS — E538 Deficiency of other specified B group vitamins: Secondary | ICD-10-CM

## 2023-09-11 MED ORDER — CYANOCOBALAMIN 1000 MCG/ML IJ SOLN
1000.0000 ug | Freq: Once | INTRAMUSCULAR | Status: AC
  Administered 2023-09-11: 1000 ug via INTRAMUSCULAR

## 2023-09-11 NOTE — Progress Notes (Signed)
   Established Patient Office Visit  Subjective   Patient ID: Frederick Rush, male    DOB: 1951-01-27  Age: 73 y.o. MRN: [id number]  Chief Complaint  Patient presents with   B12 deficiency    Vitamin B12 injection.     HPI  B12 deficiency. Vitamin B12 injection.  Patient denies irregular heart rate, weakness or GI problems.  Pateint states he was stung by several wasps on Wednesday 09/06/2023 and was given prednisone  at urgent care - has two days of prednisone  remaining. Patient approved for injection today by Dr. Alvan.   ROS    Objective:     BP (!) 128/57 (BP Location: Left Arm, Patient Position: Sitting, Cuff Size: Normal)   Pulse 64   Ht 5' 8 (1.727 m)   SpO2 97%   BMI 25.95 kg/m    Physical Exam   No results found for any visits on 09/11/23.    The ASCVD Risk score (Arnett DK, et al., 2019) failed to calculate for the following reasons:   Risk score cannot be calculated because patient has a medical history suggesting prior/existing ASCVD    Assessment & Plan:  Cyanocobalamin  admin Left detloid IM . ( Pateint requesting Left deltoid today stating that he received the last injection in right deltoid) -  patient states his next visit will be in 30  days  with Dr. Alvan in office . He is due for lab work to recheck B12 levels at this visit as well. Problem List Items Addressed This Visit       Other   B12 deficiency - Primary    Return in about 30 days (around 10/11/2023) for next B12 injection as visit with provider. Frederick Suzen SHAUNNA Alpheus, Frederick Rush

## 2023-09-11 NOTE — Patient Instructions (Signed)
 Return in 30 days for next B12 injection  and will need blood work at this appointment as well. Patient states he will  be being seen by DR. Metheney at this time anyway and would like to have this done at that visit as well.

## 2023-09-13 NOTE — Progress Notes (Unsigned)
 Office Note    HPI: Frederick Rush is a 73 y.o. (January 26, 1951) male presenting in follow-up with initial concerns for left eye amaurosis.  Frederick Rush was initially seen by my office after having transient blindness, described as vision loss in the middle of the eye with concern for amaurosis versus TIA.  CT angiogram demonstrated less than 50% stenosis, therefore he was referred to neurology, with no plans for carotid intervention.  On exam, Frederick Rush was doing well. 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  In retirement, he continues to work part-time driving trucks.  Frederick Rush is been married to his wife for nearly 45 years, and has 2 children.  Over the last year, Frederick Rush has done well.  He has had a few incidences of what he describes as floaters in the eyes, usually at times of significant stress.  In the cases he is able to wash his face, and the blindness or haziness appreciated in the eyes resolves.  Denies TIA, amaurosis, stroke  The pt is  on a statin for cholesterol management.  The pt is not on a daily aspirin .   Other AC:  - The pt is  on medication for hypertension.   The pt is not diabetic.  Tobacco hx:  -  Past Medical History:  Diagnosis Date   Arthritis    CAD (coronary artery disease), non obstructive by cath 07/19/11, treat medically 07/19/2011   Crohn disease (HCC)    Hypercholesterolemia    Hyperlipidemia    Myocardial infarction [facility])    Skin cancer 2006   Left shoulder; followed every six months.      Past Surgical History:  Procedure Laterality Date   APPENDECTOMY     CARDIAC CATHETERIZATION  07/15/2011   Medical management   ELBOW SURGERY Right    FRACTURE SURGERY Right 2015   elbow   HERNIA REPAIR     KNEE SURGERY     arthroscopy   LEFT HEART CATHETERIZATION WITH CORONARY ANGIOGRAM  07/19/2011   Procedure: LEFT HEART CATHETERIZATION WITH CORONARY ANGIOGRAM;  Surgeon: Alm LELON Clay, MD;  Location: [facility] CATH  LAB;  Service: Cardiovascular;;   TONSILLECTOMY     TOTAL KNEE ARTHROPLASTY Left 12/31/2018   Procedure: TOTAL KNEE ARTHROPLASTY;  Surgeon: Melodi Lerner, MD;  Location: [facility] ORS;  Service: Orthopedics;  Laterality: Left;    VASECTOMY      Social History   Socioeconomic History   Marital status: Married    Spouse name: Grayce   Number of children: 2   Years of education: some college   Highest education level: 12th grade  Occupational History   Occupation: BULK DRIVER    Employer: [employer]    Comment: Retired   Occupation: Freight Liner-Part time  Tobacco Use   Smoking status: Former    Current packs/day: 0.00    Types: Cigarettes    Quit date: 07/16/1998    Years since quitting: 25.1   Smokeless tobacco: Never  Vaping Use   Vaping status: Never Used  Substance and Sexual Activity   Alcohol  use: Yes    Alcohol /week: 5.0 standard drinks of alcohol     Types: 5 Cans of beer per week   Drug use: No   Sexual activity: Yes    Partners: Female    Birth control/protection: None, Post-menopausal  Other Topics Concern   Not on file  Social History Narrative   Marital status: married x 39 years  Children:  2 children; 3 grandchildren; no gg      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       Lives: Lives with his wife and their pets.      Tobacco:  None; quit in 2000; smoked x 15 years + 20 years = 35 years.      Alcohol :    5 beers a week       Exercise: mows yard.      Seatbelt: 100%; no texting   He enjoys yardwork.   Social Drivers of Health   Financial Resource Strain: Patient Declined (02/13/2023)   Overall Financial Resource Strain (CARDIA)    Difficulty of Paying Living Expenses: Patient declined  Food Insecurity: No Food Insecurity (02/13/2023)   Hunger Vital Sign    Worried About Running Out of Food in the Last Year: Never true    Ran Out of Food in the Last Year: Never true  Transportation  Needs: No Transportation Needs (02/13/2023)   PRAPARE - Administrator, Civil Service (Medical): No    Lack of Transportation (Non-Medical): No  Physical Activity: Unknown (02/13/2023)   Exercise Vital Sign    Days of Exercise per Week: Patient declined    Minutes of Exercise per Session: 30 min  Stress: Stress Concern Present (02/13/2023)   [employer] of Occupational Health - Occupational Stress Questionnaire    Feeling of Stress : To some extent  Social Connections: Unknown (02/13/2023)   Social Connection and Isolation Panel    Frequency of Communication with Friends and Family: More than three times a week    Frequency of Social Gatherings with Friends and Family: Patient declined    Attends Religious Services: Patient declined    Database administrator or Organizations: No    Attends Banker Meetings: Patient declined    Marital Status: Married  Catering manager Violence: Not At Risk (01/13/2023)   Received from [facility]   HITS    Over the last 12 months how often did your partner physically hurt you?: Never    Over the last 12 months how often did your partner insult you or talk down to you?: Patient declined    Over the last 12 months how often did your partner threaten you with physical harm?: Never    Over the last 12 months how often did your partner scream or curse at you?: Rarely   Family History  Problem Relation Age of Onset   Cancer Mother        Breast cancer; leukemia   Heart disease Mother 81       CABG   Hypertension Brother     Current Outpatient Medications  Medication Sig Dispense Refill   albuterol  (VENTOLIN  HFA) 108 (90 Base) MCG/ACT inhaler Inhale 2 puffs into the lungs every 6 (six) hours as needed for wheezing or shortness of breath. 18 g 2   amLODipine  (NORVASC ) 10 MG tablet TAKE 1 TABLET(10 MG) BY MOUTH DAILY 90 tablet 3   atorvastatin  (LIPITOR) 10 MG tablet Take 10 mg by mouth daily.     cefdinir  (OMNICEF ) 300  MG capsule Take 1 capsule (300 mg total) by mouth 2 (two) times daily. 14 capsule 0   chlorpheniramine-HYDROcodone  (TUSSIONEX) 10-8 MG/5ML Take 5 mLs by mouth every 12 (twelve) hours as needed for cough (cough, will cause drowsiness.). (Patient not taking: Reported on 09/11/2023) 115 mL 0   dicyclomine (  BENTYL) 20 MG tablet Take 20 mg by mouth 3 (three) times daily.     fexofenadine  (ALLEGRA  ALLERGY) 180 MG tablet Take 1 tablet (180 mg total) by mouth daily for 15 days. 15 tablet 0   fluticasone  (FLONASE ) 50 MCG/ACT nasal spray Place 1 spray into both nostrils daily.      meloxicam  (MOBIC ) 15 MG tablet TAKE 1 TABLET(15 MG) BY MOUTH DAILY AS NEEDED FOR PAIN 90 tablet 1   mesalamine  (APRISO ) 0.375 g 24 hr capsule Take 4 capsules (1.5 g total) by mouth every morning. 1500mg =4 capsules 360 capsule 1   predniSONE  (DELTASONE ) 20 MG tablet Take 3 tabs PO daily x 5 days. (Patient not taking: Reported on 09/11/2023) 15 tablet 0   valsartan  (DIOVAN ) 40 MG tablet Take 1 tablet (40 mg total) by mouth daily. 90 tablet 3   Current Facility-Administered Medications  Medication Dose Route Frequency Provider Last Rate Last Admin   albuterol  (PROVENTIL ) (2.5 MG/3ML) 0.083% nebulizer solution 2.5 mg  2.5 mg Nebulization Once Metheney, Catherine D, MD        Allergies  Allergen Reactions   Latex     Pt stated that latex causes itching.   Gabapentin  Rash   Rosuvastatin  Rash    Headaches and joint pain     REVIEW OF SYSTEMS:  [X]  denotes positive finding, [ ]  denotes negative finding Cardiac  Comments:  Chest pain or chest pressure:    Shortness of breath upon exertion:    Short of breath when lying flat:    Irregular heart rhythm:        Vascular    Pain in calf, thigh, or hip brought on by ambulation:    Pain in feet at night that wakes you up from your sleep:     Blood clot in your veins:    Leg swelling:         Pulmonary    Oxygen at home:    Productive cough:     Wheezing:          Neurologic    Sudden weakness in arms or legs:     Sudden numbness in arms or legs:     Sudden onset of difficulty speaking or slurred speech:    Temporary loss of vision in one eye:     Problems with dizziness:         Gastrointestinal    Blood in stool:     Vomited blood:         Genitourinary    Burning when urinating:     Blood in urine:        Psychiatric    Major depression:         Hematologic    Bleeding problems:    Problems with blood clotting too easily:        Skin    Rashes or ulcers:        Constitutional    Fever or chills:      PHYSICAL EXAMINATION:  There were no vitals filed for this visit.   General:  WDWN in NAD; vital signs documented above Gait: Not observed HENT: WNL, normocephalic Pulmonary: normal non-labored breathing , without wheezing Cardiac: regular HR Abdomen: soft, NT, no masses Skin: without rashes Vascular Exam/Pulses:  Right Left  Radial 2+ (normal) 2+ (normal)  Ulnar    Femoral    Popliteal    DP 2+ (normal) 2+ (normal)  PT     Extremities: without ischemic changes, without Gangrene ,  without cellulitis; without open wounds;  Musculoskeletal: no muscle wasting or atrophy  Neurologic: A&O X 3;  No focal weakness or paresthesias are detected Psychiatric:  The pt has Normal affect.   Non-Invasive Vascular Imaging:    Right Carotid Findings:  +----------+--------+--------+--------+------------------+--------+           PSV cm/sEDV cm/sStenosisPlaque DescriptionComments  +----------+--------+--------+--------+------------------+--------+  CCA Prox  60      9                                           +----------+--------+--------+--------+------------------+--------+  CCA Mid   67      15                                          +----------+--------+--------+--------+------------------+--------+  CCA Distal58      13              heterogenous                 +----------+--------+--------+--------+------------------+--------+  ICA Prox  50      15      1-39%   heterogenous                +----------+--------+--------+--------+------------------+--------+  ICA Mid   55      14                                          +----------+--------+--------+--------+------------------+--------+  ICA Distal56      20                                          +----------+--------+--------+--------+------------------+--------+  ECA      112     112             heterogenous                +----------+--------+--------+--------+------------------+--------+   +----------+--------+-------+----------------+-------------------+           PSV cm/sEDV cmsDescribe        Arm Pressure (mmHG)  +----------+--------+-------+----------------+-------------------+  Subclavian81     1      Multiphasic, [id number]                  +----------+--------+-------+----------------+-------------------+   +---------+--------+--+--------+-+---------+  VertebralPSV cm/s24EDV cm/s6Antegrade  +---------+--------+--+--------+-+---------+      Left Carotid Findings:  +----------+--------+--------+--------+------------------+--------+           PSV cm/sEDV cm/sStenosisPlaque DescriptionComments  +----------+--------+--------+--------+------------------+--------+  CCA Prox  64      12                                          +----------+--------+--------+--------+------------------+--------+  CCA Mid   73      15              heterogenous                +----------+--------+--------+--------+------------------+--------+  CCA Distal59      15  heterogenous                +----------+--------+--------+--------+------------------+--------+  ICA Prox  52      15      1-39%   heterogenous                +----------+--------+--------+--------+------------------+--------+  ICA Mid   78      23                                           +----------+--------+--------+--------+------------------+--------+  ICA Distal116     30                                tortuous  +----------+--------+--------+--------+------------------+--------+  ECA      87      9               heterogenous                +----------+--------+--------+--------+------------------+--------+   +----------+--------+--------+----------------+-------------------+           PSV cm/sEDV cm/sDescribe        Arm Pressure (mmHG)  +----------+--------+--------+----------------+-------------------+  [id number]    0       Multiphasic, TWO875                  +----------+--------+--------+----------------+-------------------+   +---------+--------+--+--------+--+---------+  VertebralPSV cm/s49EDV cm/s15Antegrade  +---------+--------+--+--------+--+---------+         Summary:  Right Carotid: Velocities in the right ICA are consistent with a 1-39%  stenosis.   Left Carotid: Velocities in the left ICA are consistent with a 1-39%  stenosis.   Vertebrals: Bilateral vertebral arteries demonstrate antegrade flow.  Subclavians: Normal flow hemodynamics were seen in bilateral subclavian               arteries.   *See table(s) above for measurements and observations.     ASSESSMENT/PLAN: Mattix Imhof is a 73 y.o. male presenting with left eye ocular symptoms that are not consistent with typical amaurosis.  Imaging demonstrated mild carotid disease, therefore it is something that I am following.  Bilateral carotid stenosis remains mild.   My plan is to see him every 2 years with carotid insonation to ensure that he does not have progression.  Regarding the transient floaters/blindness, his ophthalmologist, Dr. Arloa does not have an ocular etiology.   I have sent a referral to neurology for workup to ensure there is not a neurologic component.  I asked that he continue taking  his aspirin  and statin medication.     Fonda FORBES Rim, MD [facility] [phone]

## 2023-09-14 ENCOUNTER — Ambulatory Visit: Attending: Vascular Surgery | Admitting: Vascular Surgery

## 2023-09-14 ENCOUNTER — Encounter: Payer: Self-pay | Admitting: Vascular Surgery

## 2023-09-14 ENCOUNTER — Ambulatory Visit (HOSPITAL_COMMUNITY)
Admission: RE | Admit: 2023-09-14 | Discharge: 2023-09-14 | Disposition: A | Discharge status: Home / Self Care | Source: Ambulatory Visit | Attending: Vascular Surgery | Admitting: Vascular Surgery

## 2023-09-14 VITALS — BP 134/76 | HR 51 | Temp 98.0°F | Resp 18 | Ht 68.0 in | Wt 179.8 lb

## 2023-09-14 DIAGNOSIS — I6523 Occlusion and stenosis of bilateral carotid arteries: Secondary | ICD-10-CM | POA: Diagnosis not present

## 2023-10-06 ENCOUNTER — Telehealth: Payer: Self-pay

## 2023-10-06 NOTE — Telephone Encounter (Signed)
 Copied from CRM [id number]. Topic: Appointments - Appointment Info/Confirmation >> Oct 05, 2023  9:28 AM Frederick Rush wrote: Patient/patient representative is calling for information regarding an appointment.   Patient request call back due to Mychart E review before his visits on 08/18 and 08/21. Needs call back [phone]

## 2023-10-06 NOTE — Telephone Encounter (Signed)
 Patient states they sent him the e-check-in.

## 2023-10-09 ENCOUNTER — Other Ambulatory Visit: Payer: Self-pay

## 2023-10-09 ENCOUNTER — Ambulatory Visit (INDEPENDENT_AMBULATORY_CARE_PROVIDER_SITE_OTHER)

## 2023-10-09 VITALS — BP 120/58 | HR 59 | Ht 68.0 in

## 2023-10-09 DIAGNOSIS — E538 Deficiency of other specified B group vitamins: Secondary | ICD-10-CM

## 2023-10-09 MED ORDER — CYANOCOBALAMIN 1000 MCG/ML IJ SOLN
1000.0000 ug | Freq: Once | INTRAMUSCULAR | Status: AC
  Administered 2023-10-09: 1000 ug via INTRAMUSCULAR

## 2023-10-09 NOTE — Progress Notes (Signed)
   Established Patient Office Visit  Subjective   Patient ID: Frederick Rush, male    DOB: February 01, 1951  Age: 73 y.o. MRN: [id number]  Chief Complaint  Patient presents with   B12 deficiency    B12 injection nurse visit    HPI  B12 deficiency, B12 injection nurse visit. Patient denies irregular heartrate, weakness or GI problems. Has been 6 months since initial start of Vit B12 - bloodwork drawn today to check B12 levels .   ROS    Objective:     BP (!) 120/58   Pulse (!) 59   Ht 5' 8 (1.727 m)   SpO2 98%   BMI 27.34 kg/m    Physical Exam   No results found for any visits on 10/09/23.    The ASCVD Risk score (Arnett DK, et al., 2019) failed to calculate for the following reasons:   Risk score cannot be calculated because patient has a medical history suggesting prior/existing ASCVD    Assessment & Plan:  Cyanocobalamin  1,024mcg admin IM Right deltoid. Pateint tolerated injection well without complications.  B12 lab work was drawn today BEFORE administration of the B12 injection. Patient is hoping that Vit B 12 levels have improved so he can stop the monthly B 12 injections.   Problem List Items Addressed This Visit       Other   B12 deficiency - Primary  Will schedule return visit once B12 levels received from [facility] as Dr.Metheney directs based on results.   Return for Will schedule return visit once B12 levels received from [facility]. .    Suzen SHAUNNA Plenty, LPN

## 2023-10-09 NOTE — Patient Instructions (Addendum)
 Will schedule return visit once B12 levels received from [facility] as Dr.Metheney directs based on results.

## 2023-10-10 ENCOUNTER — Ambulatory Visit: Payer: Self-pay | Admitting: Family Medicine

## 2023-10-10 LAB — VITAMIN B12: Vitamin B-12: 402 pg/mL (ref 232–1245)

## 2023-10-10 NOTE — Progress Notes (Signed)
 Hi Frederick Rush, B12 is looking better.  Started at 217 then 385 now up to 402.  Please continue with supplementation.

## 2023-10-11 NOTE — Telephone Encounter (Signed)
 Is he doing B12 monhtly shots? How often? Why is it not on his med list as recurring?

## 2023-10-12 ENCOUNTER — Encounter: Payer: Self-pay | Admitting: Family Medicine

## 2023-10-12 ENCOUNTER — Ambulatory Visit (INDEPENDENT_AMBULATORY_CARE_PROVIDER_SITE_OTHER): Payer: Medicare Other | Admitting: Family Medicine

## 2023-10-12 VITALS — BP 127/79 | HR 61 | Ht 68.0 in | Wt 173.0 lb

## 2023-10-12 DIAGNOSIS — R7301 Impaired fasting glucose: Secondary | ICD-10-CM

## 2023-10-12 DIAGNOSIS — I10 Essential (primary) hypertension: Secondary | ICD-10-CM

## 2023-10-12 DIAGNOSIS — E538 Deficiency of other specified B group vitamins: Secondary | ICD-10-CM | POA: Diagnosis not present

## 2023-10-12 DIAGNOSIS — E782 Mixed hyperlipidemia: Secondary | ICD-10-CM | POA: Diagnosis not present

## 2023-10-12 DIAGNOSIS — T50905A Adverse effect of unspecified drugs, medicaments and biological substances, initial encounter: Secondary | ICD-10-CM | POA: Diagnosis not present

## 2023-10-12 LAB — POCT GLYCOSYLATED HEMOGLOBIN (HGB A1C): Hemoglobin A1C: 5.4 % (ref 4.0–5.6)

## 2023-10-12 MED ORDER — LISINOPRIL 20 MG PO TABS: 20.0000 mg | ORAL_TABLET | Freq: Every day | ORAL | 1 refills | Status: AC

## 2023-10-12 NOTE — Assessment & Plan Note (Signed)
 We did correct his allergy and medication list he is actively taking rosuvastatin  and tolerating it well.

## 2023-10-12 NOTE — Assessment & Plan Note (Signed)
 B12 deficiency likely from underlying Crohn's disease.  Recent B12 levels look fantastic continue monthly injections.

## 2023-10-12 NOTE — Progress Notes (Signed)
 Established Patient Office Visit  Subjective  Patient ID: Frederick Rush, male    DOB: Aug 27, 1950  Age: 73 y.o. MRN: [id number]  Chief Complaint  Patient presents with   Hypertension   IFG    HPI  Discussed the use of AI scribe software for clinical note transcription with the patient, who gave verbal consent to proceed.  History of Present Illness Frederick Rush is a 73 year old male who presents for medication management and follow-up.  Antihypertensive medication management - Difficulty obtaining lisinopril  refill from [facility] due to timing issues; previous prescription had four refills available until May, now exhausted. - No current symptoms of cough; previously concerned about medication-induced cough, but this has resolved.  He unfortunately did develop a rash from the valsartan  which we had switched him to and so he went back to his old lisinopril  and is doing well on that.  Lipid management - Currently taking rosuvastatin  for cholesterol management. - No allergy to rosuvastatin , despite previous error in medical records.  Allergic reaction to insect stings - Recent multiple yellow jacket stings resulting in significant swelling. - Required emergency treatment with injections and oral medication. - Known allergy to yellow jacket stings; fearful of future stings. - Exterminator has sprayed for yellow jackets; now has contract for regular pest control services.  Vitamin b12 supplementation - Receiving monthly B12 injections every four weeks. - B12 levels recently checked and within normal range. - B12 not listed on medication list, causing some confusion.  Glycemic control - A1c checked today and is 5.4.       ROS    Objective:     BP 127/79   Pulse 61   Ht 5' 8 (1.727 m)   Wt 173 lb 0.5 oz (78.5 kg)   SpO2 100%   BMI 26.31 kg/m     Physical Exam Vitals and nursing note reviewed.  Constitutional:      Appearance: Normal  appearance.  HENT:     Head: Normocephalic and atraumatic.  Eyes:     Conjunctiva/sclera: Conjunctivae normal.  Cardiovascular:     Rate and Rhythm: Normal rate and regular rhythm.  Pulmonary:     Effort: Pulmonary effort is normal.     Breath sounds: Normal breath sounds.  Skin:    General: Skin is warm and dry.  Neurological:     Mental Status: He is alert.  Psychiatric:        Mood and Affect: Mood normal.      Results for orders placed or performed in visit on 10/12/23  POCT HgB A1C  Result Value Ref Range   Hemoglobin A1C 5.4 4.0 - 5.6 %   HbA1c POC (<> result, manual entry)     HbA1c, POC (prediabetic range)     HbA1c, POC (controlled diabetic range)         The ASCVD Risk score (Arnett DK, et al., 2019) failed to calculate for the following reasons:   Risk score cannot be calculated because patient has a medical history suggesting prior/existing ASCVD    Assessment & Plan:   Problem List Items Addressed This Visit       Cardiovascular and Mediastinum   Essential hypertension - Primary   Relevant Medications   sildenafil (VIAGRA) 100 MG tablet   rosuvastatin  (CRESTOR ) 40 MG tablet   lisinopril  (ZESTRIL ) 20 MG tablet   Other Relevant Orders   CMP14+EGFR   CBC with Differential/Platelet     Endocrine   IFG (impaired  fasting glucose)   A1c looks fantastic today at 5.4.  Continue current regimen.      Relevant Orders   POCT HgB A1C (Completed)   CMP14+EGFR   CBC with Differential/Platelet     Other   Hyperlipidemia   We did correct his allergy and medication list he is actively taking rosuvastatin  and tolerating it well.      Relevant Medications   sildenafil (VIAGRA) 100 MG tablet   rosuvastatin  (CRESTOR ) 40 MG tablet   lisinopril  (ZESTRIL ) 20 MG tablet   B12 deficiency   B12 deficiency likely from underlying Crohn's disease.  Recent B12 levels look fantastic continue monthly injections.      Other Visit Diagnoses       Medication side  effect, initial encounter       Relevant Orders   CMP14+EGFR   CBC with Differential/Platelet       Assessment and Plan Assessment & Plan Hypertension Hypertension managed with lisinopril  20 mg. No cough symptoms noted. - Send new prescription for lisinopril  20 mg to [facility].  Vitamin B12 deficiency on replacement therapy Vitamin B12 levels stable with monthly injections. Medication list error noted. - Continue monthly B12 injections. - Update medication list to include Vitamin B12 injections.  Hyperlipidemia Hyperlipidemia managed with rosuvastatin . No allergy to rosuvastatin  confirmed. - Remove rosuvastatin  allergy from the medical record.  Medication side effect-will add valsartan  to intolerance list he developed a rash with it so he went back to his old lisinopril  and has been doing well with that without any side effects or problems.  Return in about 6 months (around 04/13/2024) for Hypertension, labs .    Dorothyann Byars, MD

## 2023-10-12 NOTE — Assessment & Plan Note (Signed)
 A1c looks fantastic today at 5.4.  Continue current regimen.

## 2023-10-13 ENCOUNTER — Ambulatory Visit: Payer: Self-pay | Admitting: Family Medicine

## 2023-10-13 LAB — CBC WITH DIFFERENTIAL/PLATELET
Basophils Absolute: 0.1 x10E3/uL (ref 0.0–0.2)
Basos: 1 %
EOS (ABSOLUTE): 0.1 x10E3/uL (ref 0.0–0.4)
Eos: 1 %
Hematocrit: 40.2 % (ref 37.5–51.0)
Hemoglobin: 13.1 g/dL (ref 13.0–17.7)
Immature Grans (Abs): 0 x10E3/uL (ref 0.0–0.1)
Immature Granulocytes: 0 %
Lymphocytes Absolute: 1.5 x10E3/uL (ref 0.7–3.1)
Lymphs: 17 %
MCH: 30.8 pg (ref 26.6–33.0)
MCHC: 32.6 g/dL (ref 31.5–35.7)
MCV: 94 fL (ref 79–97)
Monocytes Absolute: 1.2 x10E3/uL — ABNORMAL HIGH (ref 0.1–0.9)
Monocytes: 13 %
Neutrophils Absolute: 6.1 x10E3/uL (ref 1.4–7.0)
Neutrophils: 68 %
Platelets: 308 x10E3/uL (ref 150–450)
RBC: 4.26 x10E6/uL (ref 4.14–5.80)
RDW: 13.2 % (ref 11.6–15.4)
WBC: 9 x10E3/uL (ref 3.4–10.8)

## 2023-10-13 LAB — CMP14+EGFR
ALT: 33 IU/L (ref 0–44)
AST: 24 IU/L (ref 0–40)
Albumin: 4.6 g/dL (ref 3.8–4.8)
Alkaline Phosphatase: 38 IU/L — ABNORMAL LOW (ref 44–121)
BUN/Creatinine Ratio: 13 (ref 10–24)
BUN: 11 mg/dL (ref 8–27)
Bilirubin Total: 0.4 mg/dL (ref 0.0–1.2)
CO2: 21 mmol/L (ref 20–29)
Calcium: 9.7 mg/dL (ref 8.6–10.2)
Chloride: 99 mmol/L (ref 96–106)
Creatinine, Ser: 0.86 mg/dL (ref 0.76–1.27)
Globulin, Total: 2.1 g/dL (ref 1.5–4.5)
Glucose: 99 mg/dL (ref 70–99)
Potassium: 4.4 mmol/L (ref 3.5–5.2)
Sodium: 135 mmol/L (ref 134–144)
Total Protein: 6.7 g/dL (ref 6.0–8.5)
eGFR: 91 mL/min/1.73 (ref >59)

## 2023-10-13 NOTE — Progress Notes (Signed)
 Your lab work is within acceptable range and there are no concerning findings.   ?

## 2023-10-17 DIAGNOSIS — K5010 Crohn's disease of large intestine without complications: Secondary | ICD-10-CM | POA: Diagnosis not present

## 2023-10-19 NOTE — Progress Notes (Signed)
 Pharmacy Quality Measure Review  This patient is appearing on a report for being at risk of failing the adherence measure for cholesterol (statin) medications this calendar year.   Medication: rosuvastatin  40 mg daily Last fill date: 10/09/2023 for 90 day supply  Insurance report was not up to date. No action needed at this time.   Woodie Jock, PharmD PGY1 Pharmacy Resident

## 2023-10-24 ENCOUNTER — Encounter: Payer: Self-pay | Admitting: Sports Medicine

## 2023-11-07 DIAGNOSIS — Z79899 Other long term (current) drug therapy: Secondary | ICD-10-CM | POA: Diagnosis not present

## 2023-11-07 DIAGNOSIS — M47899 Other spondylosis, site unspecified: Secondary | ICD-10-CM | POA: Diagnosis not present

## 2023-11-07 DIAGNOSIS — M1991 Primary osteoarthritis, unspecified site: Secondary | ICD-10-CM | POA: Diagnosis not present

## 2023-11-07 DIAGNOSIS — L409 Psoriasis, unspecified: Secondary | ICD-10-CM | POA: Diagnosis not present

## 2023-11-13 ENCOUNTER — Ambulatory Visit (INDEPENDENT_AMBULATORY_CARE_PROVIDER_SITE_OTHER)

## 2023-11-13 DIAGNOSIS — E538 Deficiency of other specified B group vitamins: Secondary | ICD-10-CM | POA: Diagnosis not present

## 2023-11-13 MED ORDER — CYANOCOBALAMIN 1000 MCG/ML IJ SOLN: 1000.0000 ug | INTRAMUSCULAR | 0 refills | Status: DC

## 2023-11-13 MED ORDER — CYANOCOBALAMIN 1000 MCG/ML IJ SOLN
1000.0000 ug | INTRAMUSCULAR | Status: DC
  Administered 2023-11-13 – 2023-12-13 (×2): 1000 ug via INTRAMUSCULAR

## 2023-11-13 NOTE — Progress Notes (Signed)
 Pt here for B12 injection. Denies SOB, CP, or headache. Pt comes in every 30 days for injection.  Pt given injection in LD. Ot tolerated well. No redness or swelling noted at the injection site. Pt advised to RTC in 30 days (around 12/13/23)

## 2023-12-12 NOTE — Progress Notes (Unsigned)
 Patient is in office today for his B12 injection. Pt comes in every 30 days for his injections. Denies muscle cramps, fatigue, or medication changes.  Pt given injection in RD. Tolerated well no redness or swelling noted at the site. Pt advised to RTC in 30 days (around 01/12/24).

## 2023-12-13 ENCOUNTER — Ambulatory Visit (INDEPENDENT_AMBULATORY_CARE_PROVIDER_SITE_OTHER)

## 2023-12-13 DIAGNOSIS — E538 Deficiency of other specified B group vitamins: Secondary | ICD-10-CM

## 2023-12-19 ENCOUNTER — Ambulatory Visit: Payer: Medicare Other

## 2023-12-19 VITALS — Ht 68.5 in | Wt 176.0 lb

## 2023-12-19 DIAGNOSIS — Z0000 Encounter for general adult medical examination without abnormal findings: Secondary | ICD-10-CM | POA: Diagnosis not present

## 2023-12-19 NOTE — Patient Instructions (Signed)
  Frederick Rush , Thank you for taking time to come for your Medicare Wellness Visit. I appreciate your ongoing commitment to your health goals. Please review the following plan we discussed and let me know if I can assist you in the future.   These are the goals we discussed:  Goals       Patient Stated (pt-stated)      Patient stated that he would like to have his legs stop hurting so he can go back to the gym.      Patient Stated      Patient states he would like to lose weight.         This is a list of the screening recommended for you and due dates:  Health Maintenance  Topic Date Due   COVID-19 Vaccine (9 - Pfizer risk 2025-26 season) 05/09/2024   Medicare Annual Wellness Visit  12/18/2024   Colon Cancer Screening  05/06/2025   DTaP/Tdap/Td vaccine (3 - Td or Tdap) 09/27/2028   Pneumococcal Vaccine for age over 75  Completed   Flu Shot  Completed   Hepatitis C Screening  Completed   Zoster (Shingles) Vaccine  Completed   Meningitis B Vaccine  Aged Out

## 2023-12-19 NOTE — Progress Notes (Signed)
 Subjective:   Frederick Rush is a 73 y.o. male who presents for Medicare Annual/Subsequent preventive examination.  Visit Complete: Virtual I connected with  Frederick Rush on 12/19/23 by a audio enabled telemedicine application and verified that I am speaking with the correct person using two identifiers.  Patient Location: Home  Provider Location: Office/Clinic  I discussed the limitations of evaluation and management by telemedicine. The patient expressed understanding and agreed to proceed.  Vital Signs: Because this visit was a virtual/telehealth visit, some criteria may be missing or patient reported. Any vitals not documented were not able to be obtained and vitals that have been documented are patient reported.  Patient Medicare AWV questionnaire was completed by the patient on 12/15/2023; I have confirmed that all information answered by patient is correct and no changes since this date.  Cardiac Risk Factors include: male gender;advanced age (>31men, >95 women);hypertension;dyslipidemia;family history of premature cardiovascular disease;smoking/ tobacco exposure     Objective:    Today's Vitals   12/19/23 1000  Weight: 176 lb (79.8 kg)  Height: 5' 8.5 (1.74 m)   Body mass index is 26.37 kg/m.     12/19/2023   10:08 AM 12/14/2022    9:08 AM 12/31/2018    4:00 PM 12/24/2018    1:25 PM 07/16/2011    2:41 PM  Advanced Directives  Does Patient Have a Medical Advance Directive? Yes Yes Yes Yes Patient does not have advance directive;Patient would like information   Type of Advance Directive Living will;Healthcare Power of Attorney Living will;Healthcare Power of [employer] Power of [place];Living will Healthcare Power of [place];Living will   Does patient want to make changes to medical advance directive? No - Patient declined No - Patient declined No - Patient declined    Copy of Healthcare Power of Attorney in Chart?  No - copy requested No - copy  requested    Would patient like information on creating a medical advance directive?     Advance directive packet given   Pre-existing out of facility DNR order (yellow form or pink MOST form)     No      Data saved with a previous flowsheet row definition    Current Medications (verified) Outpatient Encounter Medications as of 12/19/2023  Medication Sig   albuterol  (VENTOLIN  HFA) 108 (90 Base) MCG/ACT inhaler Inhale 2 puffs into the lungs every 6 (six) hours as needed for wheezing or shortness of breath.   amLODipine  (NORVASC ) 10 MG tablet TAKE 1 TABLET(10 MG) BY MOUTH DAILY   dicyclomine (BENTYL) 20 MG tablet Take 20 mg by mouth 3 (three) times daily.   fluticasone  (FLONASE ) 50 MCG/ACT nasal spray Place 1 spray into both nostrils daily.    lisinopril  (ZESTRIL ) 20 MG tablet Take 1 tablet (20 mg total) by mouth daily.   meloxicam  (MOBIC ) 15 MG tablet TAKE 1 TABLET(15 MG) BY MOUTH DAILY AS NEEDED FOR PAIN   rosuvastatin  (CRESTOR ) 40 MG tablet Take 40 mg by mouth at bedtime.   tamsulosin (FLOMAX) 0.4 MG CAPS capsule Take 0.4 mg by mouth daily.   sildenafil (VIAGRA) 100 MG tablet Take 100 mg by mouth daily as needed. (Patient not taking: Reported on 12/19/2023)   Facility-Administered Encounter Medications as of 12/19/2023  Medication   albuterol  (PROVENTIL ) (2.5 MG/3ML) 0.083% nebulizer solution 2.5 mg   cyanocobalamin  (VITAMIN B12) injection 1,000 mcg    Allergies (verified) Latex, Gabapentin , and Valsartan    History: Past Medical History:  Diagnosis Date   Arthritis  CAD (coronary artery disease), non obstructive by cath 07/19/11, treat medically 07/19/2011   Carotid artery occlusion    Crohn disease (HCC)    Hypercholesterolemia    Hyperlipidemia    Myocardial infarction [name])    Skin cancer 2006   Left shoulder; followed every six months.     Past Surgical History:  Procedure Laterality Date   APPENDECTOMY     CARDIAC CATHETERIZATION  07/15/2011   Medical management    ELBOW SURGERY Right    FRACTURE SURGERY Right 2015   elbow   HERNIA REPAIR     KNEE SURGERY     arthroscopy   LEFT HEART CATHETERIZATION WITH CORONARY ANGIOGRAM  07/19/2011   Procedure: LEFT HEART CATHETERIZATION WITH CORONARY ANGIOGRAM;  Surgeon: Alm LELON Clay, MD;  Location: [facility] CATH LAB;  Service: Cardiovascular;;   TONSILLECTOMY     TOTAL KNEE ARTHROPLASTY Left 12/31/2018   Procedure: TOTAL KNEE ARTHROPLASTY;  Surgeon: Melodi Lerner, MD;  Location: [facility] ORS;  Service: Orthopedics;  Laterality: Left;    VASECTOMY     Family History  Problem Relation Age of Onset   Cancer Mother        Breast cancer; leukemia   Heart disease Mother 24       CABG   Hypertension Brother    Social History   Socioeconomic History   Marital status: Married    Spouse name: Frederick Rush   Number of children: 2   Years of education: some college   Highest education level: Associate degree: occupational, scientist, product/process development, or vocational program  Occupational History   Occupation: BULK DRIVER    Employer: [employer]    Comment: Retired   Occupation: Freight Liner-Part time  Tobacco Use   Smoking status: Former    Current packs/day: 0.00    Types: Cigarettes    Quit date: 07/16/1998    Years since quitting: 25.4   Smokeless tobacco: Never  Vaping Use   Vaping status: Never Used  Substance and Sexual Activity   Alcohol  use: Yes    Alcohol /week: 5.0 standard drinks of alcohol     Types: 5 Cans of beer per week   Drug use: No   Sexual activity: Yes    Partners: Female    Birth control/protection: None, Post-menopausal  Other Topics Concern   Not on file  Social History Narrative   Marital status: married x 39 years      Children:  2 children; 3 grandchildren; no gg      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       Lives: Lives with his wife and their pets.      Tobacco:  None; quit in 2000; smoked x 15 years + 20 years = 35 years.       Alcohol :    5 beers a week       Exercise: mows yard.      Seatbelt: 100%; no texting   He enjoys yardwork.   Social Drivers of Corporate Investment Banker Strain: Low Risk  (12/19/2023)   Overall Financial Resource Strain (CARDIA)    Difficulty of Paying Living Expenses: Not hard at all  Food Insecurity: No Food Insecurity (12/19/2023)   Hunger Vital Sign    Worried About Running Out of Food in the Last Year: Never true    Ran Out of Food in the Last Year: Never true  Transportation Needs: No Transportation Needs (12/19/2023)  PRAPARE - Administrator, Civil Service (Medical): No    Lack of Transportation (Non-Medical): No  Physical Activity: Sufficiently Active (12/19/2023)   Exercise Vital Sign    Days of Exercise per Week: 6 days    Minutes of Exercise per Session: 30 min  Stress: No Stress Concern Present (12/19/2023)   Frederick Rush of Occupational Health - Occupational Stress Questionnaire    Feeling of Stress: Only a little  Recent Concern: Stress - Stress Concern Present (10/08/2023)   Frederick Rush of Occupational Health - Occupational Stress Questionnaire    Feeling of Stress: To some extent  Social Connections: Moderately Integrated (12/19/2023)   Social Connection and Isolation Panel    Frequency of Communication with Friends and Family: More than three times a week    Frequency of Social Gatherings with Friends and Family: More than three times a week    Attends Religious Services: Never    Database Administrator or Organizations: Yes    Attends Engineer, Structural: More than 4 times per year    Marital Status: Married    Tobacco Counseling Counseling given: Not Answered   Clinical Intake:  Pre-visit preparation completed: Yes  Pain : No/denies pain     BMI - recorded: 26.37 Nutritional Status: BMI 25 -29 Overweight Nutritional Risks: None Diabetes: No  How often do you need to have someone help you when you read  instructions, pamphlets, or other written materials from your doctor or pharmacy?: 1 - Never What is the last grade level you completed in school?: 14  Interpreter Needed?: No      Activities of Daily Living    12/19/2023   10:02 AM 12/15/2023   10:13 AM  In your present state of health, do you have any difficulty performing the following activities:  Hearing? 0 0  Vision? 0 0  Difficulty concentrating or making decisions? 0 0  Walking or climbing stairs? 0 0  Dressing or bathing? 0 0  Doing errands, shopping? 0 0  Preparing Food and eating ? N N  Using the Toilet? N N  In the past six months, have you accidently leaked urine? N N  Do you have problems with loss of bowel control? N N  Managing your Medications? N N  Managing your Finances? N N  Housekeeping or managing your Housekeeping? N N    Patient Care Team: [name], MD as PCP - General (Family Medicine) Court Dorn PARAS, MD as PCP - Cardiology (Cardiology) Cam Morene ORN, MD as Attending Physician (Urology) Lindaann Dunnings, MD as Referring Physician (Internal Medicine) Lanis Fonda BRAVO, MD as Consulting Physician (Vascular Surgery)  Indicate any recent Medical Services you may have received from other than [facility] providers in the past year (date may be approximate).     Assessment:   This is a routine wellness examination for Frederick Rush.  Hearing/Vision screen No results found.   Goals Addressed             This Visit's Progress    Patient Stated       Patient states he would like to lose weight.        Depression Screen    12/19/2023   10:07 AM 10/12/2023    1:32 PM 12/14/2022    9:07 AM 10/11/2022   12:17 PM 03/17/2022    2:28 PM 09/13/2021    1:54 PM 02/16/2021    3:04 PM  PHQ 2/9 Scores  PHQ - 2 Score  0 0 1 3 0 0 0  PHQ- 9 Score  0  3       Fall Risk    12/19/2023   10:08 AM 12/15/2023   10:13 AM 10/12/2023    9:36 AM 12/14/2022    9:07 AM 12/13/2022    8:30 AM  Fall  Risk   Falls in the past year? 1 1 0 0 0  Number falls in past yr: 1 1 0 0 0  Injury with Fall? 1 1 0 0 0  Risk for fall due to : History of fall(s)  No Fall Risks No Fall Risks   Follow up Falls evaluation completed  Falls evaluation completed Falls evaluation completed     MEDICARE RISK AT HOME: Medicare Risk at Home Any stairs in or around the home?: Yes If so, are there any without handrails?: No Home free of loose throw rugs in walkways, pet beds, electrical cords, etc?: Yes Adequate lighting in your home to reduce risk of falls?: Yes Life alert?: No Use of a cane, walker or w/c?: No Grab bars in the bathroom?: No Shower chair or bench in shower?: No Elevated toilet seat or a handicapped toilet?: No  TIMED UP AND GO:  Was the test performed?  No    Cognitive Function:        12/19/2023   10:09 AM 12/14/2022    9:08 AM  6CIT Screen  What Year? 0 points 0 points  What month? 0 points 0 points  What time? 0 points 0 points  Count back from 20 0 points 0 points  Months in reverse 0 points 0 points  Repeat phrase 0 points 0 points  Total Score 0 points 0 points    Immunizations Immunization History  Administered Date(s) Administered    sv, Bivalent, Protein Subunit Rsvpref,pf (Abrysvo) 12/17/2021   Fluad Quad(high Dose 65+) 09/25/2019, 11/23/2020, 11/06/2021   Fluad Trivalent(High Dose 65+) 11/07/2022   INFLUENZA, HIGH DOSE SEASONAL PF 11/20/2017, 11/24/2018, 11/10/2023   Influenza-Unspecified 12/12/2013, 12/22/2015, 11/21/2016, 11/03/2021   Moderna Covid-19 Fall Seasonal Vaccine 29yrs & older 11/03/2021, 11/06/2021   PFIZER Comirnaty(Gray Top)Covid-19 Tri-Sucrose Vaccine 06/01/2020   PFIZER(Purple Top)SARS-COV-2 Vaccination 04/13/2019, 05/07/2019, 12/23/2019   PNEUMOCOCCAL CONJUGATE-20 03/17/2022   Pfizer Covid-19 Vaccine Bivalent Booster 51yrs & up 11/09/2020   Pfizer(Comirnaty)Fall Seasonal Vaccine 12 years and older 11/03/2021, 11/16/2022   Pneumococcal  Conjugate-13 10/04/2014   Pneumococcal Polysaccharide-23 05/24/2016   RSV,unspecified 11/03/2021   Tdap 02/22/2012, 09/28/2018   Unspecified SARS-COV-2 Vaccination 11/10/2023   Zoster Recombinant(Shingrix) 02/19/2019, 08/16/2019    TDAP status: Up to date  Flu Vaccine status: Up to date  Pneumococcal vaccine status: Up to date  Covid-19 vaccine status: Information provided on how to obtain vaccines.   Qualifies for Shingles Vaccine? Yes   Zostavax completed No   Shingrix Completed?: No.    Education has been provided regarding the importance of this vaccine. Patient has been advised to call insurance company to determine out of pocket expense if they have not yet received this vaccine. Advised may also receive vaccine at local pharmacy or Health Dept. Verbalized acceptance and understanding.  Screening Tests Health Maintenance  Topic Date Due   COVID-19 Vaccine (9 - Pfizer risk 2025-26 season) 05/09/2024   Medicare Annual Wellness (AWV)  12/18/2024   Colonoscopy  05/06/2025   DTaP/Tdap/Td (3 - Td or Tdap) 09/27/2028   Pneumococcal Vaccine: 50+ Years  Completed   Influenza Vaccine  Completed   Hepatitis C Screening  Completed  Zoster Vaccines- Shingrix  Completed   Meningococcal B Vaccine  Aged Out    Health Maintenance  There are no preventive care reminders to display for this patient.   Colorectal cancer screening: Type of screening: Colonoscopy. Completed 05/06/2020. Repeat every 7 years  Lung Cancer Screening: (Low Dose CT Chest recommended if Age 14-80 years, 20 pack-year currently smoking OR have quit w/in 15years.) does not qualify.   Lung Cancer Screening Referral: n/a  Additional Screening:  Hepatitis C Screening: does qualify; Completed 02/19/2019  Vision Screening: Recommended annual ophthalmology exams for early detection of glaucoma and other disorders of the eye. Is the patient up to date with their annual eye exam?  Yes  Who is the provider or what is  the name of the office in which the patient attends annual eye exams? Dr Arloa If pt is not established with a provider, would they like to be referred to a provider to establish care? N/a.   Dental Screening: Recommended annual dental exams for proper oral hygiene   Community Resource Referral / Chronic Care Management: CRR required this visit?  No   CCM required this visit?  No     Plan:     I have personally reviewed and noted the following in the patient's chart:   Medical and social history Use of alcohol , tobacco or illicit drugs  Current medications and supplements including opioid prescriptions. Patient is not currently taking opioid prescriptions. Functional ability and status Nutritional status Physical activity Advanced directives List of other physicians Hospitalizations, surgeries, and ER visits in previous 12 months. None Vitals Screenings to include cognitive, depression, and falls Referrals and appointments  In addition, I have reviewed and discussed with patient certain preventive protocols, quality metrics, and best practice recommendations. A written personalized care plan for preventive services as well as general preventive health recommendations were provided to patient.     Bonny Jon Mayor, CMA   12/19/2023   After Visit Summary: (MyChart) Due to this being a telephonic visit, the after visit summary with patients personalized plan was offered to patient via MyChart   Nurse Notes:   Mainor Hellmann is a 73 y.o. male patient of Metheney, Dorothyann BIRCH, MD who had a Medicare Annual Wellness Visit today via telephone. Hope is Retired and lives with their spouse. He has 2 children. He reports that he is socially active and does interact with friends/family regularly. He is moderately physically active and enjoys yard work.

## 2024-01-15 ENCOUNTER — Ambulatory Visit

## 2024-01-15 VITALS — BP 118/62 | HR 60 | Ht 68.5 in

## 2024-01-15 DIAGNOSIS — E538 Deficiency of other specified B group vitamins: Secondary | ICD-10-CM | POA: Diagnosis not present

## 2024-01-15 MED ORDER — CYANOCOBALAMIN 1000 MCG/ML IJ SOLN
1000.0000 ug | Freq: Once | INTRAMUSCULAR | Status: AC
  Administered 2024-01-15: 1000 ug via INTRAMUSCULAR

## 2024-01-15 NOTE — Progress Notes (Signed)
   Subjective:    Patient ID: Frederick Rush, male    DOB: May 28, 1950, 73 y.o.   MRN: [id number]  HPI  Patient is in office today for his B12 injection. Pt comes in every 30  days for his injections. Denies muscle cramps, fatigue, or medication changes.         Review of Systems     Objective:   Physical Exam        Assessment & Plan:   Pt given injection in LD. Tolerated well no redness or swelling noted at the site. Pt advised to RTC in 30 days (around 02/13/24).

## 2024-01-15 NOTE — Progress Notes (Signed)
   Established Patient Office Visit  Subjective   Patient ID: Frederick Rush, male    DOB: 11-24-1950  Age: 73 y.o. MRN: [id number]  Chief Complaint  Patient presents with   B12 deficiency    B12 injection as nurse visit.    HPI    ROS    Objective:     BP 118/62   Pulse 60   Ht 5' 8.5 (1.74 m)   SpO2 98%   BMI 26.37 kg/m    Physical Exam   No results found for any visits on 01/15/24.    The ASCVD Risk score (Arnett DK, et al., 2019) failed to calculate for the following reasons:   Risk score cannot be calculated because patient has a medical history suggesting prior/existing ASCVD    Assessment & Plan:  Admin cyanocobalamin  1,040mcg IM Left deltoid. Patient tolerated injection well without complication.  Patient asked if he should scheduled for 30 days for next B12 injection . In review of message from Dr. Alvan in lab results as B12 is looking better. Started at 217 then 385 now up to 402. Please continue with supplementation.  I did tell him to schedule the 30 follow up as nurse visit for B12 injection.  Problem List Items Addressed This Visit       Other   B12 deficiency - Primary    Return in about 1 month (around 02/14/2024) for B12 injection as nurse visit .    Suzen SHAUNNA Plenty, LPN

## 2024-01-15 NOTE — Patient Instructions (Signed)
Return in 30 days for next B12 injection as nurse visit.

## 2024-02-12 NOTE — Progress Notes (Unsigned)
" ° °  Subjective:    Patient ID: Frederick Rush, male    DOB: Nov 19, 1950, 73 y.o.   MRN: [id number]  HPI  Patient is in office today for B12 injection. Denies muscle cramps, heart palpitations, or weakness.   Review of Systems     Objective:   Physical Exam        Assessment & Plan:   Pt given injection in LD. Tolerated well no redness or swelling noted at the site. Pt advised to RTC in 30 days (around 03/14/24).  "

## 2024-02-13 ENCOUNTER — Ambulatory Visit (INDEPENDENT_AMBULATORY_CARE_PROVIDER_SITE_OTHER)

## 2024-02-13 VITALS — BP 130/86 | HR 60 | Resp 19 | Ht 68.0 in | Wt 173.0 lb

## 2024-02-13 DIAGNOSIS — E538 Deficiency of other specified B group vitamins: Secondary | ICD-10-CM | POA: Diagnosis not present

## 2024-02-13 MED ORDER — CYANOCOBALAMIN 1000 MCG/ML IJ SOLN
1000.0000 ug | INTRAMUSCULAR | Status: AC
  Administered 2024-02-13 – 2024-03-15 (×2): 1000 ug via INTRAMUSCULAR

## 2024-02-26 ENCOUNTER — Encounter: Payer: Self-pay | Admitting: Family Medicine

## 2024-02-26 ENCOUNTER — Ambulatory Visit: Payer: Self-pay | Admitting: *Deleted

## 2024-02-26 NOTE — Telephone Encounter (Signed)
 FYI Only or Action Required?: FYI only for provider: ED advised and patient declined.  Patient was last seen in primary care on 10/12/2023 by [name], MD.  Called Nurse Triage reporting Arm Swelling.  Symptoms began several days ago. Friday   Interventions attempted: Other: unsure .  Symptoms are: rapidly worsening.  Triage Disposition: Go to ED Now (or PCP Triage)  Patient/caregiver understands and will follow disposition?: No, wishes to speak with PCP   Patient does not want to go to ED for evaluation for sx . Unsure if he will go to UC. No available appt until tomorrow and patient would like to see PCP instead. CAL , Kamia, notified patient declined ED. Patient report due to flu in ED he does not want to go there.                 Copied from CRM #[id number]. Topic: Clinical - Red Word Triage >> Feb 26, 2024  8:06 AM Antwanette L wrote: Red Word that prompted transfer to Nurse Triage: The patient woke up on Friday morning with pain in the left arm. There is a knot below the elbow with a red spot, and the area is swollen. The patient is having difficulty sleeping. Reason for Disposition  [1] Swelling is painful to touch AND [2] fever  Answer Assessment - Initial Assessment Questions Recommended ED patient does not want to go to ED due to all of the flu. Not sure if patient will atleast go to UC. Patient requesting appt tomorrow if none available today . Recommended patient not to wait to be seen , needs to be evaluated today . CAL notified.       1. ONSET: When did the swelling start? (e.g., minutes, hours, days)     Friday  2. LOCATION: What part of the arm is swollen?  Are both arms swollen or just one arm?     Left arm swelling to elbow below where elbow bends to mid forearm 3. SEVERITY: How bad is the swelling? (e.g., localized; mild, moderate, severe)     Moderate  4. REDNESS: Is there redness or signs of infection?     Redness and hot   5. PAIN: Is the swelling painful to touch? If Yes, ask: How painful is it?   (Scale 1-10; mild, moderate or severe)     Yes pain to touch 6. FEVER: Do you have a fever? If Yes, ask: What is it, how was it measured, and when did it start?      Reports left arm hot to touch did not report temperature 7. CAUSE: What do you think is causing the arm swelling?     Red dot  on left elbow quarter size area 8. MEDICAL HISTORY: Do you have a history of heart failure, kidney disease, liver failure, or cancer?     na 9. RECURRENT SYMPTOM: Have you had arm swelling before? If Yes, ask: When was the last time? What happened that time?     na 10. OTHER SYMPTOMS: Do you have any other symptoms? (e.g., chest pain, difficulty breathing)       Left elbow to mid forearm swollen , red, hot to touch. No sleeping. No chest pain no difficulty breathing no drainage from red dot. 11. PREGNANCY: Is there any chance you are pregnant? When was your last menstrual period?       na  Protocols used: Arm Swelling and Edema-A-AH

## 2024-02-26 NOTE — Telephone Encounter (Signed)
 Patient scheduled tomorrow 1/06/226 with Dr. Alvia at 2:10pm In speaking with the patient - he mentioned that his whole arm from wrist to shoulder is swollen and hot to the touch.  I recommended he go to ER he states he refuses as when he went before it had people sick and vomiting and he did not want to be exposed to that.  He states the pain is same arm just below the scar from a previous injury that required stitches a few years ago .

## 2024-02-27 ENCOUNTER — Ambulatory Visit (INDEPENDENT_AMBULATORY_CARE_PROVIDER_SITE_OTHER): Admitting: Family Medicine

## 2024-02-27 ENCOUNTER — Encounter: Payer: Self-pay | Admitting: Family Medicine

## 2024-02-27 VITALS — BP 147/89 | HR 59 | Ht 68.0 in | Wt 175.6 lb

## 2024-02-27 DIAGNOSIS — L03114 Cellulitis of left upper limb: Secondary | ICD-10-CM | POA: Diagnosis not present

## 2024-02-27 MED ORDER — CEPHALEXIN 500 MG PO CAPS: 500.0000 mg | ORAL_CAPSULE | Freq: Four times a day (QID) | ORAL | 0 refills | Status: DC

## 2024-02-27 NOTE — Progress Notes (Signed)
 " Frederick Rush - 74 y.o. male MRN [id number]  Date of birth: 03-Aug-1950  Subjective Chief Complaint  Patient presents with   Joint Swelling    HPI Kalieb Freeland is a 74 y.o. male here today with complaint of  L elbow/forearm pain and swelling.  This started a few days ago.  No known injury to area recently but did have injury to elbow a few years ago.  He does have redness, warmth and tenderness over area.  Denies fever or chills.   ROS:  A comprehensive ROS was completed and negative except as noted per HPI  Allergies[1]  Past Medical History:  Diagnosis Date   Arthritis    CAD (coronary artery disease), non obstructive by cath 07/19/11, treat medically 07/19/2011   Carotid artery occlusion    Crohn disease (HCC)    Hypercholesterolemia    Hyperlipidemia    Myocardial infarction [facility])    Skin cancer 2006   Left shoulder; followed every six months.      Past Surgical History:  Procedure Laterality Date   APPENDECTOMY     CARDIAC CATHETERIZATION  07/15/2011   Medical management   ELBOW SURGERY Right    FRACTURE SURGERY Right 2015   elbow   HERNIA REPAIR     KNEE SURGERY     arthroscopy   LEFT HEART CATHETERIZATION WITH CORONARY ANGIOGRAM  07/19/2011   Procedure: LEFT HEART CATHETERIZATION WITH CORONARY ANGIOGRAM;  Surgeon: Alm LELON Clay, MD;  Location: [facility] CATH LAB;  Service: Cardiovascular;;   TONSILLECTOMY     TOTAL KNEE ARTHROPLASTY Left 12/31/2018   Procedure: TOTAL KNEE ARTHROPLASTY;  Surgeon: Melodi Lerner, MD;  Location: [facility] ORS;  Service: Orthopedics;  Laterality: Left;    VASECTOMY      Social History   Socioeconomic History   Marital status: Married    Spouse name: Grayce   Number of children: 2   Years of education: some college   Highest education level: Associate degree: occupational, scientist, product/process development, or vocational program  Occupational History   Occupation: BULK DRIVER    Employer: [employer]    Comment: Retired   Occupation:  Pension Scheme Manager time  Tobacco Use   Smoking status: Former    Current packs/day: 0.00    Types: Cigarettes    Quit date: 07/16/1998    Years since quitting: 25.6   Smokeless tobacco: Never  Vaping Use   Vaping status: Never Used  Substance and Sexual Activity   Alcohol  use: Yes    Alcohol /week: 5.0 standard drinks of alcohol     Types: 5 Cans of beer per week   Drug use: No   Sexual activity: Yes    Partners: Female    Birth control/protection: None, Post-menopausal  Other Topics Concern   Not on file  Social History Narrative   Marital status: married x 39 years      Children:  2 children; 3 grandchildren; no gg      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       Lives: Lives with his wife and their pets.      Tobacco:  None; quit in 2000; smoked x 15 years + 20 years = 35 years.      Alcohol :    5 beers a week       Exercise: mows yard.      Seatbelt: 100%; no texting   He enjoys yardwork.   Social Drivers  of Health   Tobacco Use: Medium Risk (02/27/2024)   Patient History    Smoking Tobacco Use: Former    Smokeless Tobacco Use: Never    Passive Exposure: Not on file  Financial Resource Strain: Low Risk (12/19/2023)   Overall Financial Resource Strain (CARDIA)    Difficulty of Paying Living Expenses: Not hard at all  Food Insecurity: No Food Insecurity (12/19/2023)   Epic    Worried About Programme Researcher, Broadcasting/film/video in the Last Year: Never true    Ran Out of Food in the Last Year: Never true  Transportation Needs: No Transportation Needs (12/19/2023)   Epic    Lack of Transportation (Medical): No    Lack of Transportation (Non-Medical): No  Physical Activity: Sufficiently Active (12/19/2023)   Exercise Vital Sign    Days of Exercise per Week: 6 days    Minutes of Exercise per Session: 30 min  Stress: No Stress Concern Present (12/19/2023)   [employer] of Occupational Health - Occupational Stress Questionnaire    Feeling  of Stress: Only a little  Recent Concern: Stress - Stress Concern Present (10/08/2023)   [employer] of Occupational Health - Occupational Stress Questionnaire    Feeling of Stress: To some extent  Social Connections: Moderately Integrated (12/19/2023)   Social Connection and Isolation Panel    Frequency of Communication with Friends and Family: More than three times a week    Frequency of Social Gatherings with Friends and Family: More than three times a week    Attends Religious Services: Never    Database Administrator or Organizations: Yes    Attends Banker Meetings: More than 4 times per year    Marital Status: Married  Depression (PHQ2-9): Low Risk (02/27/2024)   Depression (PHQ2-9)    PHQ-2 Score: 3  Alcohol  Screen: Low Risk (12/19/2023)   Alcohol  Screen    Last Alcohol  Screening Score (AUDIT): 3  Housing: Low Risk (12/19/2023)   Epic    Unable to Pay for Housing in the Last Year: No    Number of Times Moved in the Last Year: 0    Homeless in the Last Year: No  Utilities: Not At Risk (12/19/2023)   Epic    Threatened with loss of utilities: No  Health Literacy: Adequate Health Literacy (12/19/2023)   B1300 Health Literacy    Frequency of need for help with medical instructions: Never    Family History  Problem Relation Age of Onset   Cancer Mother        Breast cancer; leukemia   Heart disease Mother 28       CABG   Hypertension Brother     Health Maintenance  Topic Date Due   COVID-19 Vaccine (9 - Pfizer risk 2025-26 season) 05/09/2024   Medicare Annual Wellness (AWV)  12/18/2024   Colonoscopy  05/06/2025   DTaP/Tdap/Td (3 - Td or Tdap) 09/27/2028   Pneumococcal Vaccine: 50+ Years  Completed   Influenza Vaccine  Completed   Hepatitis C Screening  Completed   Zoster Vaccines- Shingrix  Completed   Meningococcal B Vaccine  Aged Out      ----------------------------------------------------------------------------------------------------------------------------------------------------------------------------------------------------------------- Physical Exam BP (!) 147/89 (BP Location: Right Arm, Patient Position: Sitting, Cuff Size: Normal)   Pulse (!) 59   Ht 5' 8 (1.727 m)   Wt 175 lb 9.6 oz (79.7 kg)   SpO2 96%   BMI 26.70 kg/m   Physical Exam Constitutional:      Appearance: Normal appearance.  Cardiovascular:     Rate and Rhythm: Normal rate and regular rhythm.  Pulmonary:     Effort: Pulmonary effort is normal.     Breath sounds: Normal breath sounds.  Skin:    Comments: Erythema of the L elbow with ttp.  No fluctuance  Non-tender over olecranon.   Neurological:     Mental Status: He is alert.  Psychiatric:        Mood and Affect: Mood normal.        Behavior: Behavior normal.     ------------------------------------------------------------------------------------------------------------------------------------------------------------------------------------------------------------------- Assessment and Plan  Cellulitis of forearm, left Treating with course of cephalexin .  Return in 3 days for follow up.  Red flags reviewed.    Meds ordered this encounter  Medications   cephALEXin  (KEFLEX ) 500 MG capsule    Sig: Take 1 capsule (500 mg total) by mouth 4 (four) times daily for 10 days.    Dispense:  40 capsule    Refill:  0    Return in about 3 days (around 03/01/2024) for F/u cellulitis of forearm w/ me or PCP. .        [1]  Allergies Allergen Reactions   Latex     Pt stated that latex causes itching.   Gabapentin  Rash   Valsartan  Rash   "

## 2024-02-27 NOTE — Patient Instructions (Signed)
 Cellulitis, Adult    Cellulitis is a skin infection. The infected area is often warm, red, swollen, and sore. It occurs most often on the legs, feet, and toes, but can happen on any part of the body.  This condition can be life-threatening without treatment. It is very important to get treated right away.  What are the causes?  This condition is caused by bacteria. The bacteria enter through a break in the skin, such as:  A cut.  A burn.  A bug bite.  An animal bite.  An open sore.  A crack.  What increases the risk?  Having a weak body's defense system (immune system).  Being older than 76 years old.  Having a blood sugar problem (diabetes).  Having a long-term liver disease (cirrhosis) or kidney disease.  Being very overweight (obese).  Having a skin problem, such as:  An itchy rash.  A rash caused by a fungus.  A rash with blisters.  Slow movement of blood in the veins (venous stasis).  Fluid buildup below the skin (edema).  This condition is more likely to occur in people who:  Have open cuts, burns, bites, or scrapes on the skin.  Have been treated with high-energy rays (radiation).  Use IV drugs.  What are the signs or symptoms?  Skin that:  Looks red or purple, or slightly darker than your usual skin color.  Has streaks.  Has spots.  Is swollen.  Is sore or painful when you touch it.  Is warm.  A fever.  Chills.  Blisters.  Tiredness (fatigue).  How is this treated?  Medicines to treat infections or allergies.  Rest.  Placing cold or warm cloths on the skin.  Staying in the hospital, if the condition is very bad. You may need medicines through an IV.  Follow these instructions at home:  Medicines  Take over-the-counter and prescription medicines only as told by your doctor.  If you were prescribed antibiotics, take them as told by your doctor. Do not stop using them even if you start to feel better.  General instructions  Drink enough fluid to keep your pee (urine) pale yellow.  Do not touch or rub the  infected area.  Raise (elevate) the infected area above the level of your heart while you are sitting or lying down.  Return to your normal activities when your doctor says that it is safe.  Place cold or warm cloths on the area as told by your doctor.  Keep all follow-up visits. Your doctor will need to make sure that a more serious infection is not developing.  Contact a doctor if:  You have a fever.  You do not start to get better after 1-2 days of treatment.  Your bone or joint under the infected area starts to hurt after the skin has healed.  Your infection comes back in the same area or another area. Signs of this may include:  You have a swollen bump in the area.  Your red area gets larger, turns dark in color, or hurts more.  You have more fluid coming from the wound.  Pus or a bad smell develops in your infected area.  You have more pain.  You feel sick and have muscle aches and weakness.  You develop vomiting or watery poop that will not go away.  Get help right away if:  You see red streaks coming from the area.  You notice the skin turns purple or black and falls  off.  These symptoms may be an emergency. Get help right away. Call 911.  Do not wait to see if the symptoms will go away.  Do not drive yourself to the hospital.  This information is not intended to replace advice given to you by your health care provider. Make sure you discuss any questions you have with your health care provider.  Document Revised: 10/05/2021 Document Reviewed: 10/05/2021  Elsevier Patient Education  2024 [employer].

## 2024-02-27 NOTE — Assessment & Plan Note (Signed)
 Treating with course of cephalexin .  Return in 3 days for follow up.  Red flags reviewed.

## 2024-03-01 ENCOUNTER — Ambulatory Visit: Admitting: Family Medicine

## 2024-03-01 ENCOUNTER — Encounter: Payer: Self-pay | Admitting: Family Medicine

## 2024-03-01 VITALS — BP 128/71 | HR 54 | Ht 68.0 in | Wt 184.0 lb

## 2024-03-01 DIAGNOSIS — L03114 Cellulitis of left upper limb: Secondary | ICD-10-CM | POA: Diagnosis not present

## 2024-03-01 MED ORDER — DOXYCYCLINE HYCLATE 100 MG PO TABS: 100.0000 mg | ORAL_TABLET | Freq: Two times a day (BID) | ORAL | 0 refills | Status: DC

## 2024-03-01 NOTE — Progress Notes (Signed)
 " Frederick Rush - 74 y.o. male MRN [id number]  Date of birth: 01/18/1951  Subjective Chief Complaint  Patient presents with   Cellulitis    HPI Frederick Rush is a 74 y.o. male here today for follow up of cellulitis on L forearm.  Started on cephalexin  as last visit.  Doesn't really feel that there has been much improvement.  Continues to have redness and warmth over the proximal forearm.  No fever or chills.    ROS:  A comprehensive ROS was completed and negative except as noted per HPI  Allergies[1]  Past Medical History:  Diagnosis Date   Arthritis    CAD (coronary artery disease), non obstructive by cath 07/19/11, treat medically 07/19/2011   Carotid artery occlusion    Crohn disease (HCC)    Hypercholesterolemia    Hyperlipidemia    Myocardial infarction [facility])    Skin cancer 2006   Left shoulder; followed every six months.      Past Surgical History:  Procedure Laterality Date   APPENDECTOMY     CARDIAC CATHETERIZATION  07/15/2011   Medical management   ELBOW SURGERY Right    FRACTURE SURGERY Right 2015   elbow   HERNIA REPAIR     KNEE SURGERY     arthroscopy   LEFT HEART CATHETERIZATION WITH CORONARY ANGIOGRAM  07/19/2011   Procedure: LEFT HEART CATHETERIZATION WITH CORONARY ANGIOGRAM;  Surgeon: Alm LELON Clay, MD;  Location: [facility] CATH LAB;  Service: Cardiovascular;;   TONSILLECTOMY     TOTAL KNEE ARTHROPLASTY Left 12/31/2018   Procedure: TOTAL KNEE ARTHROPLASTY;  Surgeon: Melodi Lerner, MD;  Location: [facility] ORS;  Service: Orthopedics;  Laterality: Left;    VASECTOMY      Social History   Socioeconomic History   Marital status: Married    Spouse name: Grayce   Number of children: 2   Years of education: some college   Highest education level: Associate degree: occupational, scientist, product/process development, or vocational program  Occupational History   Occupation: BULK DRIVER    Employer: [employer]    Comment: Retired   Occupation: Pension Scheme Manager  time  Tobacco Use   Smoking status: Former    Current packs/day: 0.00    Types: Cigarettes    Quit date: 07/16/1998    Years since quitting: 25.6   Smokeless tobacco: Never  Vaping Use   Vaping status: Never Used  Substance and Sexual Activity   Alcohol  use: Yes    Alcohol /week: 5.0 standard drinks of alcohol     Types: 5 Cans of beer per week   Drug use: No   Sexual activity: Yes    Partners: Female    Birth control/protection: None, Post-menopausal  Other Topics Concern   Not on file  Social History Narrative   Marital status: married x 39 years      Children:  2 children; 3 grandchildren; no gg      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       Lives: Lives with his wife and their pets.      Tobacco:  None; quit in 2000; smoked x 15 years + 20 years = 35 years.      Alcohol :    5 beers a week       Exercise: mows yard.      Seatbelt: 100%; no texting   He enjoys yardwork.   Social Drivers of Health   Tobacco Use: Medium  Risk (03/01/2024)   Patient History    Smoking Tobacco Use: Former    Smokeless Tobacco Use: Never    Passive Exposure: Not on file  Financial Resource Strain: Low Risk (12/19/2023)   Overall Financial Resource Strain (CARDIA)    Difficulty of Paying Living Expenses: Not hard at all  Food Insecurity: No Food Insecurity (12/19/2023)   Epic    Worried About Programme Researcher, Broadcasting/film/video in the Last Year: Never true    Ran Out of Food in the Last Year: Never true  Transportation Needs: No Transportation Needs (12/19/2023)   Epic    Lack of Transportation (Medical): No    Lack of Transportation (Non-Medical): No  Physical Activity: Sufficiently Active (12/19/2023)   Exercise Vital Sign    Days of Exercise per Week: 6 days    Minutes of Exercise per Session: 30 min  Stress: No Stress Concern Present (12/19/2023)   [employer] of Occupational Health - Occupational Stress Questionnaire    Feeling of Stress: Only a  little  Recent Concern: Stress - Stress Concern Present (10/08/2023)   [employer] of Occupational Health - Occupational Stress Questionnaire    Feeling of Stress: To some extent  Social Connections: Moderately Integrated (12/19/2023)   Social Connection and Isolation Panel    Frequency of Communication with Friends and Family: More than three times a week    Frequency of Social Gatherings with Friends and Family: More than three times a week    Attends Religious Services: Never    Database Administrator or Organizations: Yes    Attends Banker Meetings: More than 4 times per year    Marital Status: Married  Depression (PHQ2-9): Low Risk (02/27/2024)   Depression (PHQ2-9)    PHQ-2 Score: 3  Alcohol  Screen: Low Risk (12/19/2023)   Alcohol  Screen    Last Alcohol  Screening Score (AUDIT): 3  Housing: Low Risk (12/19/2023)   Epic    Unable to Pay for Housing in the Last Year: No    Number of Times Moved in the Last Year: 0    Homeless in the Last Year: No  Utilities: Not At Risk (12/19/2023)   Epic    Threatened with loss of utilities: No  Health Literacy: Adequate Health Literacy (12/19/2023)   B1300 Health Literacy    Frequency of need for help with medical instructions: Never    Family History  Problem Relation Age of Onset   Cancer Mother        Breast cancer; leukemia   Heart disease Mother 38       CABG   Hypertension Brother     Health Maintenance  Topic Date Due   COVID-19 Vaccine (9 - Pfizer risk 2025-26 season) 05/09/2024   Medicare Annual Wellness (AWV)  12/18/2024   Colonoscopy  05/06/2025   DTaP/Tdap/Td (3 - Td or Tdap) 09/27/2028   Pneumococcal Vaccine: 50+ Years  Completed   Influenza Vaccine  Completed   Hepatitis C Screening  Completed   Zoster Vaccines- Shingrix  Completed   Meningococcal B Vaccine  Aged Out      ----------------------------------------------------------------------------------------------------------------------------------------------------------------------------------------------------------------- Physical Exam BP 128/71   Pulse (!) 54   Ht 5' 8 (1.727 m)   Wt 184 lb (83.5 kg)   SpO2 98%   BMI 27.98 kg/m   Physical Exam Constitutional:      Appearance: Normal appearance.  Skin:    Comments: Continued erythema and induration over the proximal forearm.  No fluctuance palpated.  No  TTP over the olecranon.  FROM of the elbow.  No muscle tenderness.    Neurological:     Mental Status: He is alert.     ------------------------------------------------------------------------------------------------------------------------------------------------------------------------------------------------------------------- Assessment and Plan  Cellulitis of forearm, left No significant improvement with keflex .  Will change to doxycycline  and have him f/u in 3 days.  Red flags reviewed.    Meds ordered this encounter  Medications   doxycycline  (VIBRA -TABS) 100 MG tablet    Sig: Take 1 tablet (100 mg total) by mouth 2 (two) times daily.    Dispense:  20 tablet    Refill:  0    Return in about 3 days (around 03/04/2024) for f/u cellulitis.        [1]  Allergies Allergen Reactions   Latex     Pt stated that latex causes itching.   Gabapentin  Rash   Valsartan  Rash   "

## 2024-03-01 NOTE — Assessment & Plan Note (Signed)
 No significant improvement with keflex .  Will change to doxycycline  and have him f/u in 3 days.  Red flags reviewed.

## 2024-03-01 NOTE — Patient Instructions (Signed)
 Start doxyccyline to replace cephalexin .  Return in 2-3 days.

## 2024-03-05 ENCOUNTER — Ambulatory Visit: Admitting: Family Medicine

## 2024-03-05 ENCOUNTER — Encounter: Payer: Self-pay | Admitting: Family Medicine

## 2024-03-05 VITALS — BP 137/65 | HR 65 | Ht 68.0 in | Wt 184.0 lb

## 2024-03-05 DIAGNOSIS — L03114 Cellulitis of left upper limb: Secondary | ICD-10-CM | POA: Diagnosis not present

## 2024-03-05 NOTE — Assessment & Plan Note (Signed)
 He is making progress with doxycycline .  Recommended he continue this to complete course.  I will plan to see him back in about 1 week to be sure this is resolved.  Once again red flags reviewed and he is instructed to contact clinic if symptoms are worsening.

## 2024-03-05 NOTE — Patient Instructions (Signed)
 Finish out antibiotics.  See me again in 1 week or sooner if worsening.

## 2024-03-05 NOTE — Progress Notes (Signed)
 " Frederick Rush - 74 y.o. male MRN [id number]  Date of birth: 1951/01/24  Subjective Chief Complaint  Patient presents with   Cellulitis    HPI Gareth Fitzner is a 74 y.o. male here today for follow up of cellulitis of forearm.    Initially on keflex  without improvement.  Changed to doxycycline  at previous visit. Reports that pain is improved quite a bit.  Still with swelling and redness or proximal forearm. Denies fever or chills.  Some nausea with doxycycline  but improves if taking with food.   ROS:  A comprehensive ROS was completed and negative except as noted per HPI  Allergies[1]  Past Medical History:  Diagnosis Date   Arthritis    CAD (coronary artery disease), non obstructive by cath 07/19/11, treat medically 07/19/2011   Carotid artery occlusion    Crohn disease (HCC)    Hypercholesterolemia    Hyperlipidemia    Myocardial infarction [facility])    Skin cancer 2006   Left shoulder; followed every six months.      Past Surgical History:  Procedure Laterality Date   APPENDECTOMY     CARDIAC CATHETERIZATION  07/15/2011   Medical management   ELBOW SURGERY Right    FRACTURE SURGERY Right 2015   elbow   HERNIA REPAIR     KNEE SURGERY     arthroscopy   LEFT HEART CATHETERIZATION WITH CORONARY ANGIOGRAM  07/19/2011   Procedure: LEFT HEART CATHETERIZATION WITH CORONARY ANGIOGRAM;  Surgeon: Alm LELON Clay, MD;  Location: [facility] CATH LAB;  Service: Cardiovascular;;   TONSILLECTOMY     TOTAL KNEE ARTHROPLASTY Left 12/31/2018   Procedure: TOTAL KNEE ARTHROPLASTY;  Surgeon: Melodi Lerner, MD;  Location: [facility] ORS;  Service: Orthopedics;  Laterality: Left;    VASECTOMY      Social History   Socioeconomic History   Marital status: Married    Spouse name: Grayce   Number of children: 2   Years of education: some college   Highest education level: Associate degree: occupational, scientist, product/process development, or vocational program  Occupational History   Occupation: BULK DRIVER     Employer: [employer]    Comment: Retired   Occupation: Pension Scheme Manager time  Tobacco Use   Smoking status: Former    Current packs/day: 0.00    Types: Cigarettes    Quit date: 07/16/1998    Years since quitting: 25.6   Smokeless tobacco: Never  Vaping Use   Vaping status: Never Used  Substance and Sexual Activity   Alcohol  use: Yes    Alcohol /week: 5.0 standard drinks of alcohol     Types: 5 Cans of beer per week   Drug use: No   Sexual activity: Yes    Partners: Female    Birth control/protection: None, Post-menopausal  Other Topics Concern   Not on file  Social History Narrative   Marital status: married x 39 years      Children:  2 children; 3 grandchildren; no gg      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       Lives: Lives with his wife and their pets.      Tobacco:  None; quit in 2000; smoked x 15 years + 20 years = 35 years.      Alcohol :    5 beers a week       Exercise: mows yard.      Seatbelt: 100%; no texting   He enjoys  yardwork.   Social Drivers of Health   Tobacco Use: Medium Risk (03/05/2024)   Patient History    Smoking Tobacco Use: Former    Smokeless Tobacco Use: Never    Passive Exposure: Not on file  Financial Resource Strain: Low Risk (12/19/2023)   Overall Financial Resource Strain (CARDIA)    Difficulty of Paying Living Expenses: Not hard at all  Food Insecurity: No Food Insecurity (12/19/2023)   Epic    Worried About Programme Researcher, Broadcasting/film/video in the Last Year: Never true    Ran Out of Food in the Last Year: Never true  Transportation Needs: No Transportation Needs (12/19/2023)   Epic    Lack of Transportation (Medical): No    Lack of Transportation (Non-Medical): No  Physical Activity: Sufficiently Active (12/19/2023)   Exercise Vital Sign    Days of Exercise per Week: 6 days    Minutes of Exercise per Session: 30 min  Stress: No Stress Concern Present (12/19/2023)   [employer] of Occupational [employer] - Occupational Stress Questionnaire    Feeling of Stress: Only a little  Recent Concern: Stress - Stress Concern Present (10/08/2023)   [employer] of Occupational Health - Occupational Stress Questionnaire    Feeling of Stress: To some extent  Social Connections: Moderately Integrated (12/19/2023)   Social Connection and Isolation Panel    Frequency of Communication with Friends and Family: More than three times a week    Frequency of Social Gatherings with Friends and Family: More than three times a week    Attends Religious Services: Never    Database Administrator or Organizations: Yes    Attends Banker Meetings: More than 4 times per year    Marital Status: Married  Depression (PHQ2-9): Low Risk (02/27/2024)   Depression (PHQ2-9)    PHQ-2 Score: 3  Alcohol  Screen: Low Risk (12/19/2023)   Alcohol  Screen    Last Alcohol  Screening Score (AUDIT): 3  Housing: Low Risk (12/19/2023)   Epic    Unable to Pay for Housing in the Last Year: No    Number of Times Moved in the Last Year: 0    Homeless in the Last Year: No  Utilities: Not At Risk (12/19/2023)   Epic    Threatened with loss of utilities: No  Health Literacy: Adequate Health Literacy (12/19/2023)   B1300 Health Literacy    Frequency of need for help with medical instructions: Never    Family History  Problem Relation Age of Onset   Cancer Mother        Breast cancer; leukemia   Heart disease Mother 14       CABG   Hypertension Brother     Health Maintenance  Topic Date Due   COVID-19 Vaccine (9 - Pfizer risk 2025-26 season) 05/09/2024   Medicare Annual Wellness (AWV)  12/18/2024   Colonoscopy  05/06/2025   DTaP/Tdap/Td (3 - Td or Tdap) 09/27/2028   Pneumococcal Vaccine: 50+ Years  Completed   Influenza Vaccine  Completed   Hepatitis C Screening  Completed   Zoster Vaccines- Shingrix  Completed   Meningococcal B Vaccine  Aged Out      ----------------------------------------------------------------------------------------------------------------------------------------------------------------------------------------------------------------- Physical Exam BP 137/65 (BP Location: Left Arm, Patient Position: Sitting, Cuff Size: Normal)   Pulse 65   Ht 5' 8 (1.727 m)   Wt 184 lb (83.5 kg)   SpO2 98%   BMI 27.98 kg/m   Physical Exam Constitutional:      Appearance: Normal  appearance.  Musculoskeletal:     Comments: Still with induration of the proximal forearm however his redness and tenderness have improved.  Neurological:     Mental Status: He is alert.     ------------------------------------------------------------------------------------------------------------------------------------------------------------------------------------------------------------------- Assessment and Plan  Cellulitis of forearm, left He is making progress with doxycycline .  Recommended he continue this to complete course.  I will plan to see him back in about 1 week to be sure this is resolved.  Once again red flags reviewed and he is instructed to contact clinic if symptoms are worsening.   No orders of the defined types were placed in this encounter.   Return in about 1 week (around 03/12/2024) for f/u arm.        [1]  Allergies Allergen Reactions   Latex     Pt stated that latex causes itching.   Gabapentin  Rash   Valsartan  Rash   "

## 2024-03-06 ENCOUNTER — Other Ambulatory Visit: Payer: Self-pay | Admitting: *Deleted

## 2024-03-06 DIAGNOSIS — I10 Essential (primary) hypertension: Secondary | ICD-10-CM

## 2024-03-06 MED ORDER — AMLODIPINE BESYLATE 10 MG PO TABS: 10.0000 mg | ORAL_TABLET | Freq: Every day | ORAL | 3 refills | Status: AC

## 2024-03-13 ENCOUNTER — Encounter: Payer: Self-pay | Admitting: Family Medicine

## 2024-03-13 ENCOUNTER — Ambulatory Visit: Admitting: Family Medicine

## 2024-03-13 ENCOUNTER — Ambulatory Visit

## 2024-03-13 VITALS — BP 133/61 | HR 54 | Ht 68.0 in | Wt 175.5 lb

## 2024-03-13 DIAGNOSIS — L03114 Cellulitis of left upper limb: Secondary | ICD-10-CM | POA: Diagnosis not present

## 2024-03-13 DIAGNOSIS — M25522 Pain in left elbow: Secondary | ICD-10-CM

## 2024-03-13 MED ORDER — DOXYCYCLINE HYCLATE 100 MG PO TABS: 100.0000 mg | ORAL_TABLET | Freq: Two times a day (BID) | ORAL | 0 refills | Status: AC

## 2024-03-13 NOTE — Assessment & Plan Note (Signed)
 Continued erythema and tenderness over the proximal forearm.  Overall this is improved but still present.  Repeat course of doxycycline .  He did have prior injury to elbow a number of years ago does not think that it was completely flushed out at that time.  X-rays of elbow ordered to see if there is any retained foreign body.  Orthopedic referral placed as well given continued symptoms.

## 2024-03-13 NOTE — Progress Notes (Unsigned)
" ° °  Subjective:    Patient ID: Frederick Rush, male    DOB: 02-Jul-1950, 74 y.o.   MRN: [id number]  HPI   Patient is in office today for B12 injection. Denies muscle cramps, heart palpitations, or weakness   Review of Systems     Objective:   Physical Exam        Assessment & Plan:   Pt given injection in R. Tolerated well no redness or swelling noted at the site. Pt advised to RTC in 30 days (around 04/15/24).  "

## 2024-03-13 NOTE — Progress Notes (Signed)
 " Frederick Rush - 74 y.o. male MRN [id number]  Date of birth: 1950-04-07  Subjective Chief Complaint  Patient presents with   Follow-up    HPI Frederick Rush is a 74 year old male here today for follow-up of cellulitis of the elbow.  Recently completed course of doxycycline .  Improvement with this however after completion still has some redness and tenderness about the proximal forearm.  The pain that he was having surrounding the elbow as well with flexion of the wrist has resolved.  No drainage, fever or chills.  ROS:  A comprehensive ROS was completed and negative except as noted per HPI  Allergies[1]  Past Medical History:  Diagnosis Date   Arthritis    CAD (coronary artery disease), non obstructive by cath 07/19/11, treat medically 07/19/2011   Carotid artery occlusion    Crohn disease (HCC)    Hypercholesterolemia    Hyperlipidemia    Myocardial infarction [facility])    Skin cancer 2006   Left shoulder; followed every six months.      Past Surgical History:  Procedure Laterality Date   APPENDECTOMY     CARDIAC CATHETERIZATION  07/15/2011   Medical management   ELBOW SURGERY Right    FRACTURE SURGERY Right 2015   elbow   HERNIA REPAIR     KNEE SURGERY     arthroscopy   LEFT HEART CATHETERIZATION WITH CORONARY ANGIOGRAM  07/19/2011   Procedure: LEFT HEART CATHETERIZATION WITH CORONARY ANGIOGRAM;  Surgeon: Alm LELON Clay, MD;  Location: [facility] CATH LAB;  Service: Cardiovascular;;   TONSILLECTOMY     TOTAL KNEE ARTHROPLASTY Left 12/31/2018   Procedure: TOTAL KNEE ARTHROPLASTY;  Surgeon: Melodi Lerner, MD;  Location: [facility] ORS;  Service: Orthopedics;  Laterality: Left;    VASECTOMY      Social History   Socioeconomic History   Marital status: Married    Spouse name: Grayce   Number of children: 2   Years of education: some college   Highest education level: Associate degree: occupational, scientist, product/process development, or vocational program  Occupational History   Occupation: BULK  DRIVER    Employer: [employer]    Comment: Retired   Occupation: Pension Scheme Manager time  Tobacco Use   Smoking status: Former    Current packs/day: 0.00    Types: Cigarettes    Quit date: 07/16/1998    Years since quitting: 25.6   Smokeless tobacco: Never  Vaping Use   Vaping status: Never Used  Substance and Sexual Activity   Alcohol  use: Yes    Alcohol /week: 5.0 standard drinks of alcohol     Types: 5 Cans of beer per week   Drug use: No   Sexual activity: Yes    Partners: Female    Birth control/protection: None, Post-menopausal  Other Topics Concern   Not on file  Social History Narrative   Marital status: married x 39 years      Children:  2 children; 3 grandchildren; no gg      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       Lives: Lives with his wife and their pets.      Tobacco:  None; quit in 2000; smoked x 15 years + 20 years = 35 years.      Alcohol :    5 beers a week       Exercise: mows yard.      Seatbelt: 100%; no texting   He enjoys yardwork.  Social Drivers of Health   Tobacco Use: Medium Risk (03/13/2024)   Patient History    Smoking Tobacco Use: Former    Smokeless Tobacco Use: Never    Passive Exposure: Not on file  Financial Resource Strain: Low Risk (12/19/2023)   Overall Financial Resource Strain (CARDIA)    Difficulty of Paying Living Expenses: Not hard at all  Food Insecurity: No Food Insecurity (12/19/2023)   Epic    Worried About Programme Researcher, Broadcasting/film/video in the Last Year: Never true    Ran Out of Food in the Last Year: Never true  Transportation Needs: No Transportation Needs (12/19/2023)   Epic    Lack of Transportation (Medical): No    Lack of Transportation (Non-Medical): No  Physical Activity: Sufficiently Active (12/19/2023)   Exercise Vital Sign    Days of Exercise per Week: 6 days    Minutes of Exercise per Session: 30 min  Stress: No Stress Concern Present (12/19/2023)    [employer] of Occupational Health - Occupational Stress Questionnaire    Feeling of Stress: Only a little  Recent Concern: Stress - Stress Concern Present (10/08/2023)   [employer] of Occupational Health - Occupational Stress Questionnaire    Feeling of Stress: To some extent  Social Connections: Moderately Integrated (12/19/2023)   Social Connection and Isolation Panel    Frequency of Communication with Friends and Family: More than three times a week    Frequency of Social Gatherings with Friends and Family: More than three times a week    Attends Religious Services: Never    Database Administrator or Organizations: Yes    Attends Banker Meetings: More than 4 times per year    Marital Status: Married  Depression (PHQ2-9): Low Risk (02/27/2024)   Depression (PHQ2-9)    PHQ-2 Score: 3  Alcohol  Screen: Low Risk (12/19/2023)   Alcohol  Screen    Last Alcohol  Screening Score (AUDIT): 3  Housing: Low Risk (12/19/2023)   Epic    Unable to Pay for Housing in the Last Year: No    Number of Times Moved in the Last Year: 0    Homeless in the Last Year: No  Utilities: Not At Risk (12/19/2023)   Epic    Threatened with loss of utilities: No  Health Literacy: Adequate Health Literacy (12/19/2023)   B1300 Health Literacy    Frequency of need for help with medical instructions: Never    Family History  Problem Relation Age of Onset   Cancer Mother        Breast cancer; leukemia   Heart disease Mother 26       CABG   Hypertension Brother     Health Maintenance  Topic Date Due   COVID-19 Vaccine (9 - Pfizer risk 2025-26 season) 05/09/2024   Medicare Annual Wellness (AWV)  12/18/2024   Colonoscopy  05/06/2025   DTaP/Tdap/Td (3 - Td or Tdap) 09/27/2028   Pneumococcal Vaccine: 50+ Years  Completed   Influenza Vaccine  Completed   Hepatitis C Screening  Completed   Zoster Vaccines- Shingrix  Completed   Meningococcal B Vaccine  Aged Out      ----------------------------------------------------------------------------------------------------------------------------------------------------------------------------------------------------------------- Physical Exam BP 133/61 (BP Location: Right Arm, Patient Position: Sitting, Cuff Size: Normal)   Pulse (!) 54   Ht 5' 8 (1.727 m)   Wt 175 lb 8 oz (79.6 kg)   SpO2 98%   BMI 26.68 kg/m   Physical Exam Constitutional:      Appearance: Normal  appearance.  Eyes:     General: No scleral icterus. Cardiovascular:     Rate and Rhythm: Normal rate and regular rhythm.  Pulmonary:     Effort: Pulmonary effort is normal.     Breath sounds: Normal breath sounds.  Neurological:     Mental Status: He is alert.  Psychiatric:        Mood and Affect: Mood normal.        Behavior: Behavior normal.     ------------------------------------------------------------------------------------------------------------------------------------------------------------------------------------------------------------------- Assessment and Plan  Cellulitis of forearm, left Continued erythema and tenderness over the proximal forearm.  Overall this is improved but still present.  Repeat course of doxycycline .  He did have prior injury to elbow a number of years ago does not think that it was completely flushed out at that time.  X-rays of elbow ordered to see if there is any retained foreign body.  Orthopedic referral placed as well given continued symptoms.   Meds ordered this encounter  Medications   doxycycline  (VIBRA -TABS) 100 MG tablet    Sig: Take 1 tablet (100 mg total) by mouth 2 (two) times daily.    Dispense:  14 tablet    Refill:  0    No follow-ups on file.        [1]  Allergies Allergen Reactions   Latex     Pt stated that latex causes itching.   Gabapentin  Rash   Valsartan  Rash   "

## 2024-03-15 ENCOUNTER — Ambulatory Visit

## 2024-03-15 ENCOUNTER — Telehealth: Payer: Self-pay

## 2024-03-15 DIAGNOSIS — E538 Deficiency of other specified B group vitamins: Secondary | ICD-10-CM

## 2024-03-15 NOTE — Telephone Encounter (Signed)
 Patient left a list of his providers here that he has needed referrals for, form placed in Dr. Claudia box, thanks.

## 2024-03-18 ENCOUNTER — Other Ambulatory Visit: Payer: Self-pay | Admitting: *Deleted

## 2024-03-18 DIAGNOSIS — M25522 Pain in left elbow: Secondary | ICD-10-CM

## 2024-03-18 DIAGNOSIS — I2510 Atherosclerotic heart disease of native coronary artery without angina pectoris: Secondary | ICD-10-CM

## 2024-03-18 DIAGNOSIS — L03114 Cellulitis of left upper limb: Secondary | ICD-10-CM

## 2024-03-18 NOTE — Telephone Encounter (Signed)
 Hi Tonya now we know what that list was for. Can you put in the referrals and pend them and route back to me. Thank you.

## 2024-03-18 NOTE — Telephone Encounter (Signed)
 Referrals placed.

## 2024-03-22 ENCOUNTER — Other Ambulatory Visit: Payer: Self-pay | Admitting: *Deleted

## 2024-03-22 DIAGNOSIS — R3912 Poor urinary stream: Secondary | ICD-10-CM

## 2024-04-15 ENCOUNTER — Ambulatory Visit: Admitting: Family Medicine

## 2024-04-15 ENCOUNTER — Ambulatory Visit: Admitting: Cardiovascular Disease

## 2024-12-19 ENCOUNTER — Ambulatory Visit
